# Patient Record
Sex: Male | Born: 1950 | Race: White | Hispanic: No | Marital: Married | Smoking: Former smoker
Health system: Southern US, Community
[De-identification: ages and names within clinical notes are randomized; demographics above are authoritative.]

## PROBLEM LIST (undated history)

## (undated) DIAGNOSIS — C4491 Basal cell carcinoma of skin, unspecified: Secondary | ICD-10-CM

## (undated) DIAGNOSIS — I10 Essential (primary) hypertension: Secondary | ICD-10-CM

## (undated) DIAGNOSIS — I219 Acute myocardial infarction, unspecified: Secondary | ICD-10-CM

## (undated) DIAGNOSIS — C61 Malignant neoplasm of prostate: Secondary | ICD-10-CM

## (undated) DIAGNOSIS — N189 Chronic kidney disease, unspecified: Secondary | ICD-10-CM

## (undated) DIAGNOSIS — R519 Headache, unspecified: Secondary | ICD-10-CM

## (undated) DIAGNOSIS — C801 Malignant (primary) neoplasm, unspecified: Secondary | ICD-10-CM

## (undated) DIAGNOSIS — I209 Angina pectoris, unspecified: Secondary | ICD-10-CM

## (undated) DIAGNOSIS — M722 Plantar fascial fibromatosis: Secondary | ICD-10-CM

## (undated) DIAGNOSIS — E119 Type 2 diabetes mellitus without complications: Secondary | ICD-10-CM

## (undated) DIAGNOSIS — I2510 Atherosclerotic heart disease of native coronary artery without angina pectoris: Secondary | ICD-10-CM

## (undated) DIAGNOSIS — C679 Malignant neoplasm of bladder, unspecified: Secondary | ICD-10-CM

## (undated) DIAGNOSIS — B019 Varicella without complication: Secondary | ICD-10-CM

## (undated) DIAGNOSIS — R51 Headache: Secondary | ICD-10-CM

## (undated) HISTORY — PX: COLONOSCOPY: SHX174

## (undated) HISTORY — DX: Malignant neoplasm of prostate: C61

## (undated) HISTORY — PX: MOHS SURGERY: SUR867

## (undated) HISTORY — PX: LITHOTRIPSY: SUR834

## (undated) HISTORY — PX: CORONARY ANGIOPLASTY: SHX604

## (undated) HISTORY — PX: HERNIA REPAIR: SHX51

## (undated) HISTORY — PX: PROSTATE SURGERY: SHX751

## (undated) HISTORY — PX: CYSTECTOMY: SUR359

---

## 2004-12-03 ENCOUNTER — Ambulatory Visit: Payer: Self-pay | Admitting: Family Medicine

## 2005-02-19 ENCOUNTER — Ambulatory Visit: Payer: Self-pay | Admitting: Unknown Physician Specialty

## 2006-08-04 ENCOUNTER — Ambulatory Visit: Payer: Self-pay

## 2006-08-13 ENCOUNTER — Ambulatory Visit: Payer: Self-pay | Admitting: Urology

## 2006-08-13 ENCOUNTER — Other Ambulatory Visit: Payer: Self-pay

## 2006-08-18 ENCOUNTER — Ambulatory Visit: Payer: Self-pay | Admitting: Urology

## 2006-08-29 ENCOUNTER — Ambulatory Visit: Payer: Self-pay | Admitting: Urology

## 2006-09-04 ENCOUNTER — Ambulatory Visit: Payer: Self-pay | Admitting: Urology

## 2007-09-21 ENCOUNTER — Emergency Department: Payer: Self-pay | Admitting: Emergency Medicine

## 2008-10-31 ENCOUNTER — Ambulatory Visit: Payer: Self-pay | Admitting: Unknown Physician Specialty

## 2009-06-20 DIAGNOSIS — C4491 Basal cell carcinoma of skin, unspecified: Secondary | ICD-10-CM

## 2009-06-20 HISTORY — DX: Basal cell carcinoma of skin, unspecified: C44.91

## 2011-01-05 ENCOUNTER — Emergency Department: Payer: Self-pay | Admitting: Internal Medicine

## 2011-01-16 ENCOUNTER — Ambulatory Visit: Payer: Self-pay | Admitting: Urology

## 2011-01-17 ENCOUNTER — Ambulatory Visit: Payer: Self-pay | Admitting: Urology

## 2011-05-14 ENCOUNTER — Ambulatory Visit: Payer: Self-pay | Admitting: Urology

## 2011-05-23 ENCOUNTER — Ambulatory Visit: Payer: Self-pay | Admitting: Urology

## 2012-10-07 ENCOUNTER — Inpatient Hospital Stay: Payer: Self-pay | Admitting: Specialist

## 2012-10-07 LAB — CK TOTAL AND CKMB (NOT AT ARMC)
CK, Total: 126 U/L (ref 35–232)
CK-MB: 1.6 ng/mL (ref 0.5–3.6)

## 2012-10-07 LAB — TROPONIN I
Troponin-I: >40 ng/mL
Troponin-I: >40 ng/mL

## 2012-10-07 LAB — CBC
HCT: 42.9 % (ref 40.0–52.0)
MCH: 31.8 pg (ref 26.0–34.0)
MCHC: 36.1 g/dL — ABNORMAL HIGH (ref 32.0–36.0)
Platelet: 265 10*3/uL (ref 150–440)
RDW: 13.8 % (ref 11.5–14.5)

## 2012-10-07 LAB — BASIC METABOLIC PANEL
Creatinine: 1.1 mg/dL (ref 0.60–1.30)
EGFR (African American): >60
Glucose: 205 mg/dL — ABNORMAL HIGH (ref 65–99)
Sodium: 138 mmol/L (ref 136–145)

## 2012-10-07 LAB — APTT: Activated PTT: 24.8 secs (ref 23.6–35.9)

## 2012-10-07 LAB — CK-MB: CK-MB: 541.4 ng/mL — ABNORMAL HIGH (ref 0.5–3.6)

## 2012-10-08 LAB — CK TOTAL AND CKMB (NOT AT ARMC)
CK, Total: 1615 U/L — ABNORMAL HIGH (ref 35–232)
CK-MB: 273.3 ng/mL — ABNORMAL HIGH (ref 0.5–3.6)

## 2012-10-08 LAB — BASIC METABOLIC PANEL
BUN: 20 mg/dL — ABNORMAL HIGH (ref 7–18)
Calcium, Total: 8 mg/dL — ABNORMAL LOW (ref 8.5–10.1)
Chloride: 103 mmol/L (ref 98–107)
Creatinine: 0.95 mg/dL (ref 0.60–1.30)
EGFR (Non-African Amer.): >60
Glucose: 188 mg/dL — ABNORMAL HIGH (ref 65–99)
Sodium: 137 mmol/L (ref 136–145)

## 2012-10-08 LAB — TROPONIN I: Troponin-I: >40 ng/mL

## 2013-09-21 ENCOUNTER — Ambulatory Visit: Payer: Self-pay | Admitting: Urology

## 2013-09-23 ENCOUNTER — Ambulatory Visit: Payer: Self-pay | Admitting: Urology

## 2013-09-27 ENCOUNTER — Ambulatory Visit: Payer: Self-pay | Admitting: Urology

## 2013-09-29 LAB — PATHOLOGY REPORT

## 2013-10-18 DIAGNOSIS — C679 Malignant neoplasm of bladder, unspecified: Secondary | ICD-10-CM | POA: Insufficient documentation

## 2014-07-01 NOTE — H&P (Signed)
PATIENT NAME:  Larry Mann, Larry Mann MR#:  [id number] DATE OF BIRTH:  Feb 03, 1951  DATE OF ADMISSION:  10/07/2012  PRIMARY CARE PHYSICIAN: Irven Easterly. Kary Kos, MD  CHIEF COMPLAINT: Chest pain.   HISTORY OF PRESENT ILLNESS: This is a 64 year old male who presents to the hospital with chest pain that began around 10:00 last night. He thought it was musculoskeletal in nature as he has some right rotator cuff issues, although his chest pain and pressure did not improve. He therefore came to the ER for further evaluation. Since patient's pain has been progressively getting worse and he continues to have pain here in the ER, hospitalist services were contacted for further treatment and evaluation. The patient describes his pain as being sharp in nature, located in the center of his chest, sometimes radiating to the right side. He has become diaphoretic with it, became a bit nauseated, but no vomiting. He does admit to some mild shortness of breath. He says his pain is similar to when he had his anginal symptoms in 2003 when he had a cardiac stent. Given his significant risk factors, hospitalist services were contacted for treatment and evaluation.   REVIEW OF SYSTEMS:  CONSTITUTIONAL: No documented fever. No weight gain, no weight loss.  EYES: No blurred or double vision.  ENT: No tinnitus. No postnasal drip. No redness of the oropharynx.  RESPIRATORY: No cough, no wheeze, no hemoptysis. Positive dyspnea.  CARDIOVASCULAR: Positive chest pain. No orthopnea. No palpitations. No syncope.  GASTROINTESTINAL: Positive nausea. No vomiting, no diarrhea. No abdominal pain. No melena or hematochezia.  GENITOURINARY: No dysuria, no hematuria.  ENDOCRINE: No polyuria or nocturia. No heat or cold intolerance.  HEMATOLOGIC: No anemia, no bruising, no bleeding.  INTEGUMENTARY: No rashes. No lesions.  MUSCULOSKELETAL: No arthritis. No swelling. No gout.  NEUROLOGIC: No numbness or tingling. No ataxia. No seizure-type  activity.  PSYCHIATRIC: No anxiety. No insomnia. No ADD.   PAST MEDICAL HISTORY: Consistent with:  1. Hypertension.  2. Hyperlipidemia.  3. Diabetes.  4. History of coronary artery disease status post stent placement in 2003.   ALLERGIES: PENICILLIN, WHICH CAUSES HIVES AND ANAPHYLAXIS.   SOCIAL HISTORY: Used to be a smoker, quit in 2003. Does have a 20 pack-year smoking history. Occasional alcohol abuse. No illicit drug abuse. Lives at home with his wife.   FAMILY HISTORY: Mother is deceased from complications of multiple myeloma. Father is alive, has coronary disease and COPD. He does have a strong family history of coronary disease.   CURRENT MEDICATIONS: As follows:  1. Aspirin 81 mg daily.  2. Lipitor 20 mg daily.  3. Amlodipine 10 mg daily.  4. Lisinopril 20 mg daily.  5. Metformin 1000 mg b.i.d.  6. Metoprolol tartrate 25 mg b.i.d.   PHYSICAL EXAMINATION: On admission, is as follows: VITAL SIGNS: Noted to be: Temperature is 97.6, pulse 96, respirations 22, blood pressure 141/85, sat is 96% on 2 liters nasal cannula.  GENERAL: He is an anxious-appearing male, but in no apparent distress.  HEAD, EYES, EARS, NOSE AND THROAT: The patient is atraumatic, normocephalic. Extraocular muscles are intact. Pupils equal and reactive to light. Sclerae anicteric. No conjunctival injection. No pharyngeal erythema.  NECK: Supple. There is no jugular venous distention, no bruits, no lymphadenopathy, no thyromegaly.  HEART: Regular rate and rhythm. No murmurs. No rubs. No clicks.  LUNGS: Clear to auscultation bilaterally. No rales or rhonchi. No wheezes.  ABDOMEN: Soft, flat, nontender, nondistended. Has good bowel sounds. No hepatosplenomegaly appreciated.  EXTREMITIES: No evidence  of any cyanosis, clubbing or peripheral edema. Has +2 pedal and radial pulses bilaterally.  NEUROLOGICAL: The patient is alert, awake and oriented x3 with no focal motor or sensory deficits appreciated bilaterally.   SKIN: Moist and warm with no rashes appreciated.  LYMPHATIC: There is no cervical or axillary lymphadenopathy.   LABORATORY: Showed a serum glucose of 205, BUN 21, creatinine 1.10, sodium 138, potassium 2.9, chloride 102, bicarbonate 25, troponin 0.04, white cell count 4.9, hemoglobin 15.5, hematocrit 42.9, platelet count 265.   The patient did have an EKG done which shows normal sinus rhythm with normal axis, but sinus tachycardia and evidence of LVH by voltage criteria.   ASSESSMENT AND PLAN: This is a 64 year old male with a history of hypertension, hyperlipidemia, history of coronary artery disease status post stent placement and diabetes, who presents to the hospital with chest pain/pressure.   1. Chest pain/pressure. This patient's symptoms are very worrisome for unstable angina as his symptoms have been progressive and getting worse since last night. He does have significant risk factors given his history of coronary artery disease, diabetes, history of tobacco abuse and also family history. I will start him on aspirin, statin, beta blocker, sublingual nitroglycerin, give him some morphine for pain, also continue some oxygen and start him on a heparin nomogram. I discussed the case with Dr. Clayborn Bigness from cardiology, who plans on doing cardiac catheterization on the patient today. Will keep him n.p.o. for now except for medications.  2. Hypertension. The patient dropped his blood pressure shortly after he got some nitroglycerin. For now, will continue his beta blocker, lisinopril and Norvasc and follow hemodynamics.  3. Hyperlipidemia. Continue Lipitor.  4. Diabetes. Hold his metformin as he may get cardiac catheterization. Continue sliding scale insulin.  5. Hypokalemia. I will replace his potassium accordingly and repeat it in the morning.   CODE STATUS: The patient is a full code.   TIME SPENT ON ADMISSION: 50 minutes.   ____________________________ Belia Heman. Verdell Carmine,  MD vjs:OSi D: 10/07/2012 08:23:53 ET T: 10/07/2012 08:40:08 ET JOB#: [id number]  cc: Belia Heman. Verdell Carmine, MD, <Dictator> Henreitta Leber MD ELECTRONICALLY SIGNED 10/10/2012 15:12

## 2014-07-01 NOTE — Discharge Summary (Signed)
PATIENT NAME:  Larry Mann, Larry Mann MR#:  [id number] DATE OF BIRTH:  Jul 24, 1950  DATE OF ADMISSION:  10/07/2012 DATE OF DISCHARGE:  10/08/2012   For a detailed note, please take a look at the history and physical on admission.   DIAGNOSES AT DISCHARGE: As follows:  1. Non-ST-elevation myocardial infarction status post cardiac catheterization and stent to the obtuse marginal 1.  2. Coronary artery disease.  3. Hypertension.  4. Hyperlipidemia.  5. Hypokalemia.   DIET: The patient is being discharged on a low sodium, low fat diet.   ACTIVITIES: As tolerated.   FOLLOWUP: With Dr. Nehemiah Massed in the next 1 to 2 weeks. Also follow up with Dr. Jarome Lamas in the next 1 to 2 weeks.  DISCHARGE MEDICATIONS: As follows: 1. Metoprolol tartrate 25 mg b.i.d. 2. Lisinopril 20 mg daily.  3. Amlodipine 10 mg daily.  4. Aspirin 81 mg daily.  5. Metformin 1000 mg b.i.d. 6. Plavix 75 mg daily.  7. Lipitor 80 mg at bedtime.  8. Potassium 20 mEq b.i.d. for 4 days.   [place] COURSE: Dr. Lujean Amel from cardiology.   PERTINENT STUDIES DONE DURING THE HOSPITAL COURSE: As follows: A cardiac catheterization done by Dr. Clayborn Bigness which showed ejection fraction of 50%, but the patient was noted to have a 100% stenosis of the obtuse marginal, and the patient underwent PCI and drug-eluting stent to the obtuse marginal vessel. The patient had a mid LAD lesion 50% to 75%, but it was the nonculprit lesion.   HOSPITAL COURSE: This is a 64 year old male with medical problems as mentioned above, who presented to the hospital on 10/07/2012 complaining of chest pain.   1. Non-ST elevation myocardial infarction. The patient presented to the hospital with chest pain progressively getting worse, worrisome for unstable angina. The patient has a previous history of coronary artery disease and stent placement in 2003. A cardiology consult was obtained to further evaluate the patient as he had  significant risk factors given previous history of CAD with previous history of tobacco abuse and diabetes. The patient, after evaluation by Dr. Clayborn Bigness, underwent an urgent cardiac catheterization which did show a 100% stenosis of the obtuse marginal 1, and therefore the patient underwent drug-eluting stent placement to that vessel. Post catheterization and stent placement, the patient has been chest pain free and hemodynamically stable. His EF is 55% based on the cardiac catheterization. The patient is therefore presently being discharged on aspirin, Plavix, metoprolol, ACE inhibitor and a statin and close followup with cardiology as an outpatient. Initial troponins on the patient were negative, but they did peak to as high as greater than 40; therefore, this was truly a non-ST elevation MI.  2. Hypertension. The patient remained hemodynamically stable. He will continue his metoprolol, lisinopril and Norvasc as stated.  3. Hyperlipidemia. The patient was already on Lipitor prior to coming in at 20 mg, but I did raise the dose to 80 mg given his acute non-ST elevation MI.  4. Diabetes. The patient's metformin was held as he got dye from cardiac catheterization. He is advised to hold his metformin for 1 more day and then resume it tomorrow. He was maintained on sliding scale insulin while in the hospital.  5. Hypokalemia. The patient is being discharged on some oral potassium supplements.   CODE STATUS: The patient is a full code.   TIME SPENT ON DISCHARGE: 40 minutes.   ____________________________ Belia Heman. Verdell Carmine, MD vjs:OSi D: 10/08/2012 11:59:34 ET T: 10/08/2012 12:19:42 ET  JOB#: [id number]  cc: Belia Heman. Verdell Carmine, MD, <Dictator> Corey Skains, MD Irven Easterly. Kary Kos, MD Henreitta Leber MD ELECTRONICALLY SIGNED 10/10/2012 15:12

## 2014-07-02 NOTE — H&P (Signed)
PATIENT NAME:  Larry Mann, Larry Mann MR#:  [id number] DATE OF BIRTH:  23-Jun-1950  DATE OF ADMISSION:  09/27/2013   CHIEF COMPLAINT: Recurrent bladder cancer.   HISTORY OF PRESENT ILLNESS: Larry Mann is a 64 year old white male who underwent surveillance cystoscopy June 17th and was found to have papillary bladder tumor just proximal to the right ureteral orifice and papillary tumor of the prostatic urethra. The bladder tumor measured 15 x 10 mm and the prostatic urethral tumor measured 10 x 10 mm. He comes in now for transurethral resection of the bladder tumor and transurethral resection of the prostate and prostatic urethral tumor.   ALLERGIES:  PENICILLIN.  CURRENT MEDICATIONS: Include aspirin, metformin, metoprolol, glipizide, lisinopril, atorvastatin, amlodipine  PAST SURGICAL HISTORY: Included:  1.  Right inguinal herniorrhaphy in 2003.  2.  Coronary stent placement in 2003.  3.  Coronary stent placement in 2014.  4.  Transurethral resection of bladder cancer in 2008.  5.  ESWL, 09/23/2013.   SOCIAL HISTORY: The patient denied tobacco use. He consumes alcohol socially. He quit smoking in 2003 with a 20 pack-year history.   FAMILY HISTORY: The patient's father is alive and well at age 74. Mother died of cancer at age 47.   PAST AND CURRENT MEDICAL CONDITIONS:  1.  Diabetes.  2.  Coronary artery disease status post stent placement.  3.  History of superficial skin cancer.  4. Hypercholesterolemia.   REVIEW OF SYSTEMS:  The patient denied chest pain, shortness of breath or stroke.   PHYSICAL EXAMINATION: GENERAL: A well-nourished white male in no acute distress.  HEENT: Sclerae were clear. Pupils were equally round, reactive to light and accommodation. Extraocular movements intact.  NECK: Supple. No palpable cervical adenopathy. No audible carotid bruits.  LUNGS: Clear to auscultation.  CARDIOVASCULAR: Regular rhythm and rate without audible murmurs.  ABDOMEN: Soft, nontender  abdomen.  GENITOURINARY: Circumcised. Testes were atrophic, 15 cubic centimeters in size each.  RECTAL: 40 gram, smooth, nontender prostate.  NEUROMUSCULAR: Alert and oriented x 3.   IMPRESSION: 1.  Transitional cell carcinoma of the bladder.  2.  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   PLAN: 1.  Transurethral resection of bladder tumor.  2.  Transurethral resection of the prostatic urethral tumor and prostate.     ____________________________ Otelia Limes. Yves Dill, MD [email or web address]:[id number] D: 09/20/2013 10:12:02 ET T: 09/20/2013 10:39:18 ET JOB#: [id number]  cc: Otelia Limes. Yves Dill, MD, <Dictator> Royston Cowper MD ELECTRONICALLY SIGNED 09/21/2013 8:30

## 2014-07-02 NOTE — Op Note (Signed)
PATIENT NAME:  Larry Mann, Larry Mann MR#:  [id number] DATE OF BIRTH:  1951-02-25  DATE OF PROCEDURE:  09/27/2013  PREOPERATIVE DIAGNOSES:  1.  Transitional cell carcinoma of the bladder.  2.  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   POSTOPERATIVE DIAGNOSES:  1.  Transitional cell carcinoma of the bladder.  2.  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   PROCEDURES:  1.  Transurethral resection of the bladder tumor.  2.  Partial transurethral resection of the prostate.   SURGEON: Otelia Limes. Yves Dill, MD  ANESTHETISTBenjamine Mola  ANESTHETIC METHOD: General per [place] and local per Dr. Yves Dill.   INDICATIONS: See the dictated history and physical. After informed consent, the patient requested the above procedures.   OPERATIVE SUMMARY: After adequate general anesthesia had been obtained, the patient was placed into dorsal lithotomy position and the perineum was prepped and draped in the usual fashion. Urethra was dilated up to 28-French with Leander Rams sounds. A 27-French resectoscope sheath was then advanced into the bladder with the obturator in place. Resectoscope was then coupled to the camera and then placed into the sheath. The bladder was thoroughly inspected. Both ureteral orifices were identified and had clear efflux. The patient had a 10 x 20 mm papillary bladder tumor extending just lateral to the right orifice up the right bladder wall. The patient also had a 10 x 10 mm papillary tumor of the right bladder neck extending into the prostatic urethra. The bladder was moderately trabeculated. No other bladder lesions were identified. The patient had lateral lobe prostatic hypertrophy. At this point, the bladder tumor on the right lateral wall was resected and fragments submitted to pathology. The base of the tumor was cauterized. The prostatic urethral tumor was then resected and specimen submitted separately. The base of this area was  also cauterized. Finally, the surgical sites were inspected and further cauterization was performed with the rollerball electrode. At this point, the scope was removed and 10 mL of viscous Xylocaine was instilled within the urethra and the bladder. A [id number] silicone catheter was placed. The catheter was irrigated until clear. B and O suppository was placed. The procedure was then terminated and the patient was transferred to the recovery room in stable condition.   ____________________________ Otelia Limes. Yves Dill, MD mrw:sk D: 09/27/2013 15:28:46 ET T: 09/28/2013 00:35:19 ET JOB#: [id number]  cc: Otelia Limes. Yves Dill, MD, <Dictator> Royston Cowper MD ELECTRONICALLY SIGNED 09/28/2013 9:18

## 2014-07-29 ENCOUNTER — Other Ambulatory Visit: Payer: Self-pay

## 2014-10-26 ENCOUNTER — Encounter: Payer: Self-pay | Admitting: *Deleted

## 2014-10-27 ENCOUNTER — Ambulatory Visit: Payer: Commercial Managed Care - HMO | Admitting: Anesthesiology

## 2014-10-27 ENCOUNTER — Encounter
Admission: RE | Disposition: A | Discharge status: Home / Self Care | Payer: Self-pay | Source: Ambulatory Visit | Attending: Unknown Physician Specialty

## 2014-10-27 ENCOUNTER — Encounter: Payer: Self-pay | Admitting: *Deleted

## 2014-10-27 ENCOUNTER — Ambulatory Visit
Admission: RE | Admit: 2014-10-27 | Discharge: 2014-10-27 | Disposition: A | Discharge status: Home / Self Care | Payer: Commercial Managed Care - HMO | Source: Ambulatory Visit | Attending: Unknown Physician Specialty | Admitting: Unknown Physician Specialty

## 2014-10-27 DIAGNOSIS — I129 Hypertensive chronic kidney disease with stage 1 through stage 4 chronic kidney disease, or unspecified chronic kidney disease: Secondary | ICD-10-CM | POA: Insufficient documentation

## 2014-10-27 DIAGNOSIS — Z9889 Other specified postprocedural states: Secondary | ICD-10-CM | POA: Insufficient documentation

## 2014-10-27 DIAGNOSIS — D123 Benign neoplasm of transverse colon: Secondary | ICD-10-CM | POA: Insufficient documentation

## 2014-10-27 DIAGNOSIS — I2510 Atherosclerotic heart disease of native coronary artery without angina pectoris: Secondary | ICD-10-CM | POA: Insufficient documentation

## 2014-10-27 DIAGNOSIS — N189 Chronic kidney disease, unspecified: Secondary | ICD-10-CM | POA: Insufficient documentation

## 2014-10-27 DIAGNOSIS — K5730 Diverticulosis of large intestine without perforation or abscess without bleeding: Secondary | ICD-10-CM | POA: Diagnosis not present

## 2014-10-27 DIAGNOSIS — I252 Old myocardial infarction: Secondary | ICD-10-CM | POA: Diagnosis not present

## 2014-10-27 DIAGNOSIS — Z86010 Personal history of colonic polyps: Secondary | ICD-10-CM | POA: Diagnosis present

## 2014-10-27 DIAGNOSIS — Z87891 Personal history of nicotine dependence: Secondary | ICD-10-CM | POA: Insufficient documentation

## 2014-10-27 DIAGNOSIS — Z79899 Other long term (current) drug therapy: Secondary | ICD-10-CM | POA: Diagnosis not present

## 2014-10-27 DIAGNOSIS — K640 First degree hemorrhoids: Secondary | ICD-10-CM | POA: Insufficient documentation

## 2014-10-27 DIAGNOSIS — Z859 Personal history of malignant neoplasm, unspecified: Secondary | ICD-10-CM | POA: Insufficient documentation

## 2014-10-27 DIAGNOSIS — E1122 Type 2 diabetes mellitus with diabetic chronic kidney disease: Secondary | ICD-10-CM | POA: Diagnosis not present

## 2014-10-27 DIAGNOSIS — Z9861 Coronary angioplasty status: Secondary | ICD-10-CM | POA: Insufficient documentation

## 2014-10-27 DIAGNOSIS — Z85828 Personal history of other malignant neoplasm of skin: Secondary | ICD-10-CM | POA: Insufficient documentation

## 2014-10-27 HISTORY — DX: Plantar fascial fibromatosis: M72.2

## 2014-10-27 HISTORY — DX: Headache, unspecified: R51.9

## 2014-10-27 HISTORY — DX: Chronic kidney disease, unspecified: N18.9

## 2014-10-27 HISTORY — DX: Acute myocardial infarction, unspecified: I21.9

## 2014-10-27 HISTORY — DX: Malignant (primary) neoplasm, unspecified: C80.1

## 2014-10-27 HISTORY — DX: Essential (primary) hypertension: I10

## 2014-10-27 HISTORY — DX: Basal cell carcinoma of skin, unspecified: C44.91

## 2014-10-27 HISTORY — DX: Headache: R51

## 2014-10-27 HISTORY — DX: Varicella without complication: B01.9

## 2014-10-27 HISTORY — DX: Angina pectoris, unspecified: I20.9

## 2014-10-27 HISTORY — DX: Type 2 diabetes mellitus without complications: E11.9

## 2014-10-27 HISTORY — PX: COLONOSCOPY WITH PROPOFOL: SHX5780

## 2014-10-27 HISTORY — DX: Atherosclerotic heart disease of native coronary artery without angina pectoris: I25.10

## 2014-10-27 LAB — GLUCOSE, CAPILLARY: Glucose-Capillary: 181 mg/dL — ABNORMAL HIGH (ref 65–99)

## 2014-10-27 SURGERY — COLONOSCOPY WITH PROPOFOL
Anesthesia: General

## 2014-10-27 MED ORDER — LABETALOL HCL 5 MG/ML IV SOLN
INTRAVENOUS | Status: DC | PRN
  Administered 2014-10-27: 5 mg via INTRAVENOUS

## 2014-10-27 MED ORDER — MIDAZOLAM HCL 5 MG/5ML IJ SOLN
INTRAMUSCULAR | Status: DC | PRN
  Administered 2014-10-27: 1 mg via INTRAVENOUS

## 2014-10-27 MED ORDER — PROPOFOL INFUSION 10 MG/ML OPTIME
INTRAVENOUS | Status: DC | PRN
  Administered 2014-10-27: 120 ug/kg/min via INTRAVENOUS

## 2014-10-27 MED ORDER — PROPOFOL 10 MG/ML IV BOLUS
INTRAVENOUS | Status: DC | PRN
  Administered 2014-10-27: 50 mg via INTRAVENOUS

## 2014-10-27 MED ORDER — SODIUM CHLORIDE 0.9 % IV SOLN
INTRAVENOUS | Status: DC
  Administered 2014-10-27: 08:00:00 via INTRAVENOUS

## 2014-10-27 MED ORDER — SODIUM CHLORIDE 0.9 % IV SOLN: INTRAVENOUS | Status: DC

## 2014-10-27 MED ORDER — LIDOCAINE HCL (PF) 2 % IJ SOLN
INTRAMUSCULAR | Status: DC | PRN
  Administered 2014-10-27: 50 mg

## 2014-10-27 MED ORDER — FENTANYL CITRATE (PF) 100 MCG/2ML IJ SOLN
INTRAMUSCULAR | Status: DC | PRN
  Administered 2014-10-27: 50 ug via INTRAVENOUS

## 2014-10-27 NOTE — H&P (Signed)
   Primary Care Physician:  [place] Pink, MD Primary Gastroenterologist:  Dr. Vira Agar  Pre-Procedure History & Physical: HPI:  Larry Mann is a 64 y.o. male is here for an colonoscopy.   Past Medical History  Diagnosis Date  . Hypertension   . Diabetes mellitus without complication   . Coronary artery disease   . Myocardial infarction   . Headache   . Chronic kidney disease   . Plantar fasciitis   . Cancer   . Chicken pox   . Basal cell carcinoma   . Anginal pain     Past Surgical History  Procedure Laterality Date  . Hernia repair    . Coronary angioplasty    . Lithotripsy    . Colonoscopy    . Prostate surgery    . Cystectomy      Prior to Admission medications   Medication Sig Start Date End Date Taking? Authorizing Provider  amLODipine (NORVASC) 10 MG tablet Take 10 mg by mouth daily.   Yes Historical Provider, MD  aspirin EC 81 MG tablet Take 81 mg by mouth daily.   Yes Historical Provider, MD  atorvastatin (LIPITOR) 20 MG tablet Take 20 mg by mouth daily.   Yes Historical Provider, MD  glipiZIDE (GLUCOTROL XL) 5 MG 24 hr tablet Take 5 mg by mouth daily with breakfast.   Yes Historical Provider, MD  lisinopril (PRINIVIL,ZESTRIL) 20 MG tablet Take 20 mg by mouth daily.   Yes Historical Provider, MD  metFORMIN (GLUCOPHAGE) 1000 MG tablet Take 1,000 mg by mouth 2 (two) times daily with a meal.   Yes Historical Provider, MD  metoprolol tartrate (LOPRESSOR) 25 MG tablet Take 25 mg by mouth 2 (two) times daily.   Yes Historical Provider, MD    Allergies as of 09/21/2014  . (Not on File)    History reviewed. No pertinent family history.  Social History   Social History  . Marital Status: Married    Spouse Name: N/A  . Number of Children: N/A  . Years of Education: N/A   Occupational History  . Not on file.   Social History Main Topics  . Smoking status: Former Research scientist (life sciences)  . Smokeless tobacco: Not on file  . Alcohol Use: No  . Drug Use: No  . Sexual  Activity: Not on file   Other Topics Concern  . Not on file   Social History Narrative    Review of Systems: See HPI, otherwise negative ROS  Physical Exam: BP 144/90 mmHg  Pulse 100  Temp(Src) 97.3 F (36.3 C) (Tympanic)  Resp 18  Ht 5\' 6"  (1.676 m)  Wt 83.915 kg (185 lb)  BMI 29.87 kg/m2  SpO2 98% General:   Alert,  pleasant and cooperative in NAD Head:  Normocephalic and atraumatic. Neck:  Supple; no masses or thyromegaly. Lungs:  Clear throughout to auscultation.    Heart:  Regular rate and rhythm. Abdomen:  Soft, nontender and nondistended. Normal bowel sounds, without guarding, and without rebound.   Neurologic:  Alert and  oriented x4;  grossly normal neurologically.  Impression/Plan: Larry Mann is here for an colonoscopy to be performed for [facility] of colon polyps  Risks, benefits, limitations, and alternatives regarding  colonoscopy have been reviewed with the patient.  Questions have been answered.  All parties agreeable.   Gaylyn Cheers, MD  10/27/2014, 8:03 AM

## 2014-10-27 NOTE — Transfer of Care (Signed)
Immediate Anesthesia Transfer of Care Note  Patient: Larry Mann  Procedure(s) Performed: Procedure(s): COLONOSCOPY WITH PROPOFOL (N/A)  Patient Location: PACU  Anesthesia Type:General  Level of Consciousness: sedated  Airway & Oxygen Therapy: Patient Spontanous Breathing and Patient connected to nasal cannula oxygen  Post-op Assessment: Report given to RN and Post -op Vital signs reviewed and stable  Post vital signs: Reviewed and stable  Last Vitals:  Filed Vitals:   10/27/14 0721  BP: 144/90  Pulse: 100  Temp: 36.3 C  Resp: 18    Complications: No apparent anesthesia complications

## 2014-10-27 NOTE — Anesthesia Preprocedure Evaluation (Signed)
Anesthesia Evaluation  Patient identified by MRN, date of birth, ID band Patient awake    Reviewed: Allergy & Precautions, H&P , NPO status , Patient's Chart, lab work & pertinent test results, reviewed documented beta blocker date and time   History of Anesthesia Complications Negative for: history of anesthetic complications  Airway Mallampati: I  TM Distance: >3 FB Neck ROM: full    Dental no notable dental hx.    Pulmonary neg shortness of breath, neg sleep apnea, neg COPDneg recent URI, former smoker,  breath sounds clear to auscultation  Pulmonary exam normal       Cardiovascular Exercise Tolerance: Good hypertension, On Medications - angina+ CAD, + Past MI and + Cardiac Stents - CABG negative cardio ROS Normal cardiovascular exam- dysrhythmias - Valvular Problems/MurmursRhythm:regular Rate:Normal     Neuro/Psych negative neurological ROS  negative psych ROS   GI/Hepatic negative GI ROS, Neg liver ROS,   Endo/Other  diabetes, Oral Hypoglycemic Agents  Renal/GU CRFRenal disease  negative genitourinary   Musculoskeletal   Abdominal   Peds  Hematology negative hematology ROS (+)   Anesthesia Other Findings Past Medical History:   Hypertension                                                 Diabetes mellitus without complication                       Coronary artery disease                                      Myocardial infarction                                        Headache                                                     Chronic kidney disease                                       Plantar fasciitis                                            Cancer                                                       Chicken pox                                                  Basal cell  carcinoma                                         Anginal pain                                                 Reproductive/Obstetrics negative OB ROS                             Anesthesia Physical Anesthesia Plan  ASA: III  Anesthesia Plan: General   Post-op Pain Management:    Induction:   Airway Management Planned:   Additional Equipment:   Intra-op Plan:   Post-operative Plan:   Informed Consent: I have reviewed the patients History and Physical, chart, labs and discussed the procedure including the risks, benefits and alternatives for the proposed anesthesia with the patient or authorized representative who has indicated his/her understanding and acceptance.   Dental Advisory Given  Plan Discussed with: Anesthesiologist, CRNA and Surgeon  Anesthesia Plan Comments:         Anesthesia Quick Evaluation

## 2014-10-27 NOTE — Op Note (Signed)
[facility] Gastroenterology Patient Name: Larry Mann Procedure Date: 10/27/2014 7:21 AM MRN: [id number] Account #: [id number] Date of Birth: [birth year] Admit Type: Outpatient Age: 64 Room: [facility] ENDO ROOM 1 Gender: Male Note Status: Finalized Procedure:         Colonoscopy Indications:       Personal history of colonic polyps Providers:         Manya Silvas, MD Referring MD:      Irven Easterly. Kary Kos, MD (Referring MD) Medicines:         Propofol per Anesthesia Complications:     No immediate complications. Procedure:         Pre-Anesthesia Assessment:                    - After reviewing the risks and benefits, the patient was                     deemed in satisfactory condition to undergo the procedure.                    After obtaining informed consent, the colonoscope was                     passed under direct vision. Throughout the procedure, the                     patient's blood pressure, pulse, and oxygen saturations                     were monitored continuously. The Colonoscope was                     introduced through the anus and advanced to the the cecum,                     identified by appendiceal orifice and ileocecal valve. The                     colonoscopy was somewhat difficult due to restricted                     mobility of the colon and a tortuous colon. Successful                     completion of the procedure was aided by applying                     abdominal pressure. The patient tolerated the procedure                     well. The quality of the bowel preparation was good. Findings:      A small polyp was found in the transverse colon. The polyp was sessile.       The polyp was removed with a jumbo cold forceps. Resection and retrieval       were complete.      Multiple small and large-mouthed diverticula were found in the sigmoid       colon, in the descending colon, in the transverse colon and in the       ascending  colon.      Internal hemorrhoids were found during endoscopy. The hemorrhoids were       small and Grade I (internal hemorrhoids that do not prolapse).  The exam was otherwise without abnormality. Impression:        - One small polyp in the transverse colon. Resected and                     retrieved.                    - Diverticulosis in the sigmoid colon, in the descending                     colon, in the transverse colon and in the ascending colon.                    - Internal hemorrhoids.                    - The examination was otherwise normal. Recommendation:    - Await pathology results. Manya Silvas, MD 10/27/2014 8:36:37 AM This report has been signed electronically. Number of Addenda: 0 Note Initiated On: 10/27/2014 7:21 AM Scope Withdrawal Time: 0 hours 8 minutes 51 seconds  Total Procedure Duration: 0 hours 21 minutes 54 seconds       [facility]

## 2014-10-28 ENCOUNTER — Encounter: Payer: Self-pay | Admitting: Unknown Physician Specialty

## 2014-10-28 NOTE — Anesthesia Postprocedure Evaluation (Signed)
  Anesthesia Post-op Note  Patient: Larry Mann  Procedure(s) Performed: Procedure(s): COLONOSCOPY WITH PROPOFOL (N/A)  Anesthesia type:General  Patient location: PACU  Post pain: Pain level controlled  Post assessment: Post-op Vital signs reviewed, Patient's Cardiovascular Status Stable, Respiratory Function Stable, Patent Airway and No signs of Nausea or vomiting  Post vital signs: Reviewed and stable  Last Vitals:  Filed Vitals:   10/27/14 0910  BP: 121/86  Pulse: 85  Temp:   Resp: 12    Level of consciousness: awake, alert  and patient cooperative  Complications: No apparent anesthesia complications

## 2014-10-31 LAB — SURGICAL PATHOLOGY

## 2014-11-10 DIAGNOSIS — I10 Essential (primary) hypertension: Secondary | ICD-10-CM | POA: Insufficient documentation

## 2016-10-17 DIAGNOSIS — I2109 ST elevation (STEMI) myocardial infarction involving other coronary artery of anterior wall: Secondary | ICD-10-CM | POA: Insufficient documentation

## 2016-11-14 ENCOUNTER — Encounter: Payer: Commercial Managed Care - HMO | Attending: Internal Medicine | Admitting: *Deleted

## 2016-11-14 ENCOUNTER — Encounter: Payer: Self-pay | Admitting: *Deleted

## 2016-11-14 VITALS — Ht 65.8 in | Wt 186.1 lb

## 2016-11-14 DIAGNOSIS — I10 Essential (primary) hypertension: Secondary | ICD-10-CM | POA: Diagnosis not present

## 2016-11-14 DIAGNOSIS — Z955 Presence of coronary angioplasty implant and graft: Secondary | ICD-10-CM | POA: Diagnosis not present

## 2016-11-14 DIAGNOSIS — Z7984 Long term (current) use of oral hypoglycemic drugs: Secondary | ICD-10-CM | POA: Insufficient documentation

## 2016-11-14 DIAGNOSIS — E119 Type 2 diabetes mellitus without complications: Secondary | ICD-10-CM | POA: Insufficient documentation

## 2016-11-14 DIAGNOSIS — I214 Non-ST elevation (NSTEMI) myocardial infarction: Secondary | ICD-10-CM

## 2016-11-14 DIAGNOSIS — Z87891 Personal history of nicotine dependence: Secondary | ICD-10-CM | POA: Insufficient documentation

## 2016-11-14 DIAGNOSIS — Z79899 Other long term (current) drug therapy: Secondary | ICD-10-CM | POA: Diagnosis not present

## 2016-11-14 NOTE — Patient Instructions (Signed)
Patient Instructions  Patient Details  Name: Larry Mann MRN: [id number] Date of Birth: 12-May-1950 Referring Provider:  Corey Skains, MD  Below are the personal goals you chose as well as exercise and nutrition goals. Our goal is to help you keep on track towards obtaining and maintaining your goals. We will be discussing your progress on these goals with you throughout the program.  Initial Exercise Prescription:     Initial Exercise Prescription - 11/14/16 1500      Date of Initial Exercise RX and Referring Provider   Date 11/14/16   Referring Provider Serafina Royals MD     Treadmill   MPH 2.9   Grade 2   Minutes 15   METs 4.02     NuStep   Level 3   SPM 80   Minutes 15   METs 3     REL-XR   Level 2   Speed 50   Minutes 15   METs 3     Prescription Details   Frequency (times per week) 2   Duration Progress to 45 minutes of aerobic exercise without signs/symptoms of physical distress     Intensity   THRR 40-80% of Max Heartrate 113-142   Ratings of Perceived Exertion 11-13   Perceived Dyspnea 0-4     Progression   Progression Continue to progress workloads to maintain intensity without signs/symptoms of physical distress.     Resistance Training   Training Prescription Yes   Weight 3 lbs   Reps 10-15      Exercise Goals: Frequency: Be able to perform aerobic exercise three times per week working toward 3-5 days per week.  Intensity: Work with a perceived exertion of 11 (fairly light) - 15 (hard) as tolerated. Follow your new exercise prescription and watch for changes in prescription as you progress with the program. Changes will be reviewed with you when they are made.  Duration: You should be able to do 30 minutes of continuous aerobic exercise in addition to a 5 minute warm-up and a 5 minute cool-down routine.  Nutrition Goals: Your personal nutrition goals will be established when you do your nutrition analysis with the dietician.  The  following are nutrition guidelines to follow: Cholesterol < 200mg /day Sodium < 1500mg /day Fiber: Men over 50 yrs - 30 grams per day  Personal Goals:     Personal Goals and Risk Factors at Admission - 11/14/16 1321      Core Components/Risk Factors/Patient Goals on Admission    Weight Management Yes;Weight Loss   Intervention Weight Management: Develop a combined nutrition and exercise program designed to reach desired caloric intake, while maintaining appropriate intake of nutrient and fiber, sodium and fats, and appropriate energy expenditure required for the weight goal.;Weight Management: Provide education and appropriate resources to help participant work on and attain dietary goals.;Obesity: Provide education and appropriate resources to help participant work on and attain dietary goals.   Admit Weight 186 lb 1.6 oz (84.4 kg)   Goal Weight: Short Term 184 lb (83.5 kg)   Goal Weight: Long Term 180 lb (81.6 kg)   Expected Outcomes Short Term: Continue to assess and modify interventions until short term weight is achieved;Long Term: Adherence to nutrition and physical activity/exercise program aimed toward attainment of established weight goal   Diabetes Yes   Intervention Provide education about signs/symptoms and action to take for hypo/hyperglycemia.;Provide education about proper nutrition, including hydration, and aerobic/resistive exercise prescription along with prescribed medications to achieve blood glucose in  normal ranges: Fasting glucose 65-99 mg/dL   Expected Outcomes Short Term: Participant verbalizes understanding of the signs/symptoms and immediate care of hyper/hypoglycemia, proper foot care and importance of medication, aerobic/resistive exercise and nutrition plan for blood glucose control.;Long Term: Attainment of HbA1C < 7%.   Hypertension Yes   Intervention Provide education on lifestyle modifcations including regular physical activity/exercise, weight management,  moderate sodium restriction and increased consumption of fresh fruit, vegetables, and low fat dairy, alcohol moderation, and smoking cessation.;Monitor prescription use compliance.   Expected Outcomes Short Term: Continued assessment and intervention until BP is < 140/38mm HG in hypertensive participants. < 130/41mm HG in hypertensive participants with diabetes, heart failure or chronic kidney disease.;Long Term: Maintenance of blood pressure at goal levels.   Lipids Yes   Intervention Provide education and support for participant on nutrition & aerobic/resistive exercise along with prescribed medications to achieve LDL 70mg , HDL >40mg .   Expected Outcomes Short Term: Participant states understanding of desired cholesterol values and is compliant with medications prescribed. Participant is following exercise prescription and nutrition guidelines.;Long Term: Cholesterol controlled with medications as prescribed, with individualized exercise RX and with personalized nutrition plan. Value goals: LDL < 70mg , HDL > 40 mg.      Tobacco Use Initial Evaluation: History  Smoking Status  . Former Smoker  . Quit date: 05/19/2001  Smokeless Tobacco  . Never Used    Exercise Goals and Review:     Exercise Goals    Row Name 11/14/16 1551             Exercise Goals   Increase Physical Activity Yes       Intervention Provide advice, education, support and counseling about physical activity/exercise needs.;Develop an individualized exercise prescription for aerobic and resistive training based on initial evaluation findings, risk stratification, comorbidities and participant's personal goals.       Expected Outcomes Achievement of increased cardiorespiratory fitness and enhanced flexibility, muscular endurance and strength shown through measurements of functional capacity and personal statement of participant.       Increase Strength and Stamina Yes       Intervention Provide advice, education,  support and counseling about physical activity/exercise needs.;Develop an individualized exercise prescription for aerobic and resistive training based on initial evaluation findings, risk stratification, comorbidities and participant's personal goals.       Expected Outcomes Achievement of increased cardiorespiratory fitness and enhanced flexibility, muscular endurance and strength shown through measurements of functional capacity and personal statement of participant.       Able to understand and use rate of perceived exertion (RPE) scale Yes       Intervention Provide education and explanation on how to use RPE scale       Expected Outcomes Short Term: Able to use RPE daily in rehab to express subjective intensity level;Long Term:  Able to use RPE to guide intensity level when exercising independently       Able to understand and use Dyspnea scale Yes       Intervention Provide education and explanation on how to use Dyspnea scale       Expected Outcomes Short Term: Able to use Dyspnea scale daily in rehab to express subjective sense of shortness of breath during exertion;Long Term: Able to use Dyspnea scale to guide intensity level when exercising independently       Knowledge and understanding of Target Heart Rate Range (THRR) Yes       Intervention Provide education and explanation of THRR including how the  numbers were predicted and where they are located for reference       Expected Outcomes Short Term: Able to state/look up THRR;Long Term: Able to use THRR to govern intensity when exercising independently;Short Term: Able to use daily as guideline for intensity in rehab       Able to check pulse independently Yes       Intervention Provide education and demonstration on how to check pulse in carotid and radial arteries.;Review the importance of being able to check your own pulse for safety during independent exercise       Expected Outcomes Short Term: Able to explain why pulse checking is  important during independent exercise;Long Term: Able to check pulse independently and accurately       Understanding of Exercise Prescription Yes       Intervention Provide education, explanation, and written materials on patient's individual exercise prescription       Expected Outcomes Short Term: Able to explain program exercise prescription;Long Term: Able to explain home exercise prescription to exercise independently          Copy of goals given to participant.

## 2016-11-14 NOTE — Progress Notes (Signed)
Cardiac Individual Treatment Plan  Patient Details  Name: Larry Mann MRN: [id number] Date of Birth: 1950/07/08 Referring Provider:     Cardiac Rehab from 11/14/2016 in [facility] Cardiac and Pulmonary Rehab  Referring Provider  Serafina Royals MD      Initial Encounter Date:    Cardiac Rehab from 11/14/2016 in [facility] Cardiac and Pulmonary Rehab  Date  11/14/16  Referring Provider  Serafina Royals MD      Visit Diagnosis: NSTEMI (non-ST elevated myocardial infarction) [facility])  Status post coronary artery stent placement  Patient's Home Medications on Admission:  Current Outpatient Prescriptions:  .  amLODipine (NORVASC) 10 MG tablet, Take 10 mg by mouth daily., Disp: , Rfl:  .  aspirin EC 81 MG tablet, Take 81 mg by mouth daily., Disp: , Rfl:  .  atorvastatin (LIPITOR) 20 MG tablet, Take 20 mg by mouth daily., Disp: , Rfl:  .  glipiZIDE (GLUCOTROL XL) 5 MG 24 hr tablet, Take 5 mg by mouth daily with breakfast., Disp: , Rfl:  .  losartan (COZAAR) 100 MG tablet, Take by mouth., Disp: , Rfl:  .  metFORMIN (GLUCOPHAGE) 1000 MG tablet, Take 1,000 mg by mouth 2 (two) times daily with a meal., Disp: , Rfl:  .  metoprolol tartrate (LOPRESSOR) 25 MG tablet, Take 25 mg by mouth 2 (two) times daily., Disp: , Rfl:  .  potassium chloride (MICRO-K) 10 MEQ CR capsule, Take by mouth., Disp: , Rfl:  .  ticagrelor (BRILINTA) 90 MG TABS tablet, Take by mouth., Disp: , Rfl:   Past Medical History: Past Medical History:  Diagnosis Date  . Anginal pain ([place])   . Basal cell carcinoma   . Cancer ([place])   . Chicken pox   . Chronic kidney disease   . Coronary artery disease   . Diabetes mellitus without complication ([place])   . Headache   . Hypertension   . Myocardial infarction ([place])   . Plantar fasciitis     Tobacco Use: History  Smoking Status  . Former Smoker  . Quit date: 05/19/2001  Smokeless Tobacco  . Never Used    Labs: Recent Review Flowsheet Data    There is no flowsheet data to  display.       Exercise Target Goals: Date: 11/14/16  Exercise Program Goal: Individual exercise prescription set with THRR, safety & activity barriers. Participant demonstrates ability to understand and report RPE using BORG scale, to self-measure pulse accurately, and to acknowledge the importance of the exercise prescription.  Exercise Prescription Goal: Starting with aerobic activity 30 plus minutes a day, 3 days per week for initial exercise prescription. Provide home exercise prescription and guidelines that participant acknowledges understanding prior to discharge.  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 HR 84 bpm     Max Ex. BP 146/64     2 Minute Post BP 128/64        Oxygen Initial Assessment:   Oxygen Re-Evaluation:   Oxygen Discharge (Final Oxygen Re-Evaluation):   Initial Exercise Prescription:     Initial Exercise Prescription - 11/14/16 1500      Date of Initial Exercise RX and Referring Provider   Date 11/14/16   Referring Provider Serafina Royals MD     Treadmill   MPH 2.9   Grade 2   Minutes 15   METs 4.02     NuStep   Level 3   SPM 80   Minutes 15   METs 3     REL-XR   Level 2   Speed 50   Minutes 15   METs 3      Prescription Details   Frequency (times per week) 2   Duration Progress to 45 minutes of aerobic exercise without signs/symptoms of physical distress     Intensity   THRR 40-80% of Max Heartrate 113-142   Ratings of Perceived Exertion 11-13   Perceived Dyspnea 0-4     Progression   Progression Continue to progress workloads to maintain intensity without signs/symptoms of physical distress.     Resistance Training   Training Prescription Yes   Weight 3 lbs   Reps 10-15      Perform Capillary Blood Glucose checks as needed.  Exercise Prescription Changes:     Exercise Prescription Changes    Row Name 11/14/16 1500             Response to Exercise   Blood Pressure (Admit) 116/64       Blood Pressure (Exercise) 146/64       Blood Pressure (Exit) 128/64       Heart Rate (Admit) 84 bpm       Heart Rate (Exercise) 125 bpm       Heart Rate (Exit) 99 bpm       Oxygen Saturation (Admit) 97 %       Oxygen Saturation (Exercise) 98 %       Rating of Perceived Exertion (Exercise) 11       Symptoms none       Comments walk test results          Exercise Comments:   Exercise Goals and Review:     Exercise Goals    Row Name 11/14/16 1551             Exercise Goals   Increase Physical Activity Yes       Intervention Provide advice, education, support and counseling about physical activity/exercise needs.;Develop an individualized exercise prescription for aerobic and resistive training based on initial evaluation findings, risk stratification, comorbidities and participant's personal goals.       Expected Outcomes Achievement of increased cardiorespiratory fitness and enhanced flexibility, muscular endurance and strength shown through measurements of functional capacity and personal statement of participant.       Increase Strength and Stamina Yes       Intervention Provide advice, education, support and counseling about physical activity/exercise needs.;Develop  an  individualized exercise prescription for aerobic and resistive training based on initial evaluation findings, risk stratification, comorbidities and participant's personal goals.       Expected Outcomes Achievement of increased cardiorespiratory fitness and enhanced flexibility, muscular endurance and strength shown through measurements of functional capacity and personal statement of participant.       Able to understand and use rate of perceived exertion (RPE) scale Yes       Intervention Provide education and explanation on how to use RPE scale       Expected Outcomes Short Term: Able to use RPE daily in rehab to express subjective intensity level;Long Term:  Able to use RPE to guide intensity level when exercising independently       Able to understand and use Dyspnea scale Yes       Intervention Provide education and explanation on how to use Dyspnea scale       Expected Outcomes Short Term: Able to use Dyspnea scale daily in rehab to express subjective sense of shortness of breath during exertion;Long Term: Able to use Dyspnea scale to guide intensity level when exercising independently       Knowledge and understanding of Target Heart Rate Range (THRR) Yes       Intervention Provide education and explanation of THRR including how the numbers were predicted and where they are located for reference       Expected Outcomes Short Term: Able to state/look up THRR;Long Term: Able to use THRR to govern intensity when exercising independently;Short Term: Able to use daily as guideline for intensity in rehab       Able to check pulse independently Yes       Intervention Provide education and demonstration on how to check pulse in carotid and radial arteries.;Review the importance of being able to check your own pulse for safety during independent exercise       Expected Outcomes Short Term: Able to explain why pulse checking is important during independent exercise;Long Term: Able to check pulse  independently and accurately       Understanding of Exercise Prescription Yes       Intervention Provide education, explanation, and written materials on patient's individual exercise prescription       Expected Outcomes Short Term: Able to explain program exercise prescription;Long Term: Able to explain home exercise prescription to exercise independently          Exercise Goals Re-Evaluation :   Discharge Exercise Prescription (Final Exercise Prescription Changes):     Exercise Prescription Changes - 11/14/16 1500      Response to Exercise   Blood Pressure (Admit) 116/64   Blood Pressure (Exercise) 146/64   Blood Pressure (Exit) 128/64   Heart Rate (Admit) 84 bpm   Heart Rate (Exercise) 125 bpm   Heart Rate (Exit) 99 bpm   Oxygen Saturation (Admit) 97 %   Oxygen Saturation (Exercise) 98 %   Rating of Perceived Exertion (Exercise) 11   Symptoms none   Comments walk test results      Nutrition:  Target Goals: Understanding of nutrition guidelines, daily intake of sodium 1500mg , cholesterol 200mg , calories 30% from fat and 7% or less from saturated fats, daily to have 5 or more servings of fruits and vegetables.  Biometrics:     Pre Biometrics - 11/14/16 1552      Pre Biometrics   Height 5' 5.8" (1.671 m)   Weight 186 lb 1.6 oz (84.4 kg)   Waist Circumference 41 inches  Hip Circumference 40.5 inches   Waist to Hip Ratio 1.01 %   BMI (Calculated) 30.23   Single Leg Stand 7.67 seconds       Nutrition Therapy Plan and Nutrition Goals:     Nutrition Therapy & Goals - 11/14/16 1320      Nutrition Therapy   RD appointment defered Yes  Does not want to meet with the RD.       Nutrition Discharge: Rate Your Plate Scores:     Nutrition Assessments - 11/14/16 1320      MEDFICTS Scores   Pre Score --  Deferred completing the questionnaire      Nutrition Goals Re-Evaluation:   Nutrition Goals Discharge (Final Nutrition Goals  Re-Evaluation):   Psychosocial: Target Goals: Acknowledge presence or absence of significant depression and/or stress, maximize coping skills, provide positive support system. Participant is able to verbalize types and ability to use techniques and skills needed for reducing stress and depression.   Initial Review & Psychosocial Screening:     Initial Psych Review & Screening - 11/14/16 1322      Initial Review   Current issues with None Identified     Family Dynamics   Good Support System? Yes  Wife     Barriers   Psychosocial barriers to participate in program There are no identifiable barriers or psychosocial needs.;The patient should benefit from training in stress management and relaxation.     Screening Interventions   Interventions Encouraged to exercise  Declined to complete the QOL questionnaire       Quality of Life Scores:      Quality of Life - 11/14/16 1323      Quality of Life Scores   Health/Function Pre --  Declined to complete this questionnaire      PHQ-9: Recent Review Flowsheet Data    Depression screen [facility] 2/9 11/14/2016   Decreased Interest 0   Down, Depressed, Hopeless 0   PHQ - 2 Score 0   Altered sleeping 0   Tired, decreased energy 0   Change in appetite 0   Feeling bad or failure about yourself  0   Trouble concentrating 0   Moving slowly or fidgety/restless 0   Suicidal thoughts 0   PHQ-9 Score 0   Difficult doing work/chores Not difficult at all     Interpretation of Total Score  Total Score Depression Severity:  1-4 = Minimal depression, 5-9 = Mild depression, 10-14 = Moderate depression, 15-19 = Moderately severe depression, 20-27 = Severe depression   Psychosocial Evaluation and Intervention:   Psychosocial Re-Evaluation:   Psychosocial Discharge (Final Psychosocial Re-Evaluation):   Vocational Rehabilitation: Provide vocational rehab assistance to qualifying candidates.   Vocational Rehab Evaluation &  Intervention:     Vocational Rehab - 11/14/16 1325      Initial Vocational Rehab Evaluation & Intervention   Assessment shows need for Vocational Rehabilitation No      Education: Education Goals: Education classes will be provided on a variety of topics geared toward better understanding of heart health and risk factor modification. Participant will state understanding/return demonstration of topics presented as noted by education test scores.  Learning Barriers/Preferences:     Learning Barriers/Preferences - 11/14/16 1324      Learning Barriers/Preferences   Learning Barriers None   Learning Preferences None      Education Topics: General Nutrition Guidelines/Fats and Fiber: -Group instruction provided by verbal, written material, models and posters to present the general guidelines for heart healthy nutrition.  Gives an explanation and review of dietary fats and fiber.   Controlling Sodium/Reading Food Labels: -Group verbal and written material supporting the discussion of sodium use in heart healthy nutrition. Review and explanation with models, verbal and written materials for utilization of the food label.   Exercise Physiology & Risk Factors: - Group verbal and written instruction with models to review the exercise physiology of the cardiovascular system and associated critical values. Details cardiovascular disease risk factors and the goals associated with each risk factor.   Aerobic Exercise & Resistance Training: - Gives group verbal and written discussion on the health impact of inactivity. On the components of aerobic and resistive training programs and the benefits of this training and how to safely progress through these programs.   Flexibility, Balance, General Exercise Guidelines: - Provides group verbal and written instruction on the benefits of flexibility and balance training programs. Provides general exercise guidelines with specific guidelines to those  with heart or lung disease. Demonstration and skill practice provided.   Stress Management: - Provides group verbal and written instruction about the health risks of elevated stress, cause of high stress, and healthy ways to reduce stress.   Depression: - Provides group verbal and written instruction on the correlation between heart/lung disease and depressed mood, treatment options, and the stigmas associated with seeking treatment.   Anatomy & Physiology of the Heart: - Group verbal and written instruction and models provide basic cardiac anatomy and physiology, with the coronary electrical and arterial systems. Review of: AMI, Angina, Valve disease, Heart Failure, Cardiac Arrhythmia, Pacemakers, and the ICD.   Cardiac Procedures: - Group verbal and written instruction to review commonly prescribed medications for heart disease. Reviews the medication, class of the drug, and side effects. Includes the steps to properly store meds and maintain the prescription regimen. (beta blockers and nitrates)   Cardiac Medications I: - Group verbal and written instruction to review commonly prescribed medications for heart disease. Reviews the medication, class of the drug, and side effects. Includes the steps to properly store meds and maintain the prescription regimen.   Cardiac Medications II: -Group verbal and written instruction to review commonly prescribed medications for heart disease. Reviews the medication, class of the drug, and side effects. (all other drug classes)    Go Sex-Intimacy & Heart Disease, Get SMART - Goal Setting: - Group verbal and written instruction through game format to discuss heart disease and the return to sexual intimacy. Provides group verbal and written material to discuss and apply goal setting through the application of the S.M.A.R.T. Method.   Other Matters of the Heart: - Provides group verbal, written materials and models to describe Heart Failure,  Angina, Valve Disease, Peripheral Artery Disease, and Diabetes in the realm of heart disease. Includes description of the disease process and treatment options available to the cardiac patient.   Exercise & Equipment Safety: - Individual verbal instruction and demonstration of equipment use and safety with use of the equipment.   Cardiac Rehab from 11/14/2016 in [facility] Cardiac and Pulmonary Rehab  Date  11/14/16  Educator  Sb  Instruction Review Code  1- Verbalizes Understanding      Infection Prevention: - Provides verbal and written material to individual with discussion of infection control including proper hand washing and proper equipment cleaning during exercise session.   Cardiac Rehab from 11/14/2016 in [facility] Cardiac and Pulmonary Rehab  Date  11/14/16  Educator  Sb  Instruction Review Code  1- Verbalizes Understanding  Falls Prevention: - Provides verbal and written material to individual with discussion of falls prevention and safety.   Cardiac Rehab from 11/14/2016 in [facility] Cardiac and Pulmonary Rehab  Date  11/14/16  Educator  SB  Instruction Review Code  1- Verbalizes Understanding      Diabetes: - Individual verbal and written instruction to review signs/symptoms of diabetes, desired ranges of glucose level fasting, after meals and with exercise. Acknowledge that pre and post exercise glucose checks will be done for 3 sessions at entry of program.   Cardiac Rehab from 11/14/2016 in [facility] Cardiac and Pulmonary Rehab  Date  11/14/16  Educator  Sb  Instruction Review Code  1- Verbalizes Understanding      Other: -Provides group and verbal instruction on various topics (see comments)    Knowledge Questionnaire Score:     Knowledge Questionnaire Score - 11/14/16 1324      Knowledge Questionnaire Score   Pre Score Declined to complete this questionnaire      Core Components/Risk Factors/Patient Goals at Admission:     Personal Goals and Risk Factors at  Admission - 11/14/16 1321      Core Components/Risk Factors/Patient Goals on Admission    Weight Management Yes;Weight Loss   Intervention Weight Management: Develop a combined nutrition and exercise program designed to reach desired caloric intake, while maintaining appropriate intake of nutrient and fiber, sodium and fats, and appropriate energy expenditure required for the weight goal.;Weight Management: Provide education and appropriate resources to help participant work on and attain dietary goals.;Obesity: Provide education and appropriate resources to help participant work on and attain dietary goals.   Admit Weight 186 lb 1.6 oz (84.4 kg)   Goal Weight: Short Term 184 lb (83.5 kg)   Goal Weight: Long Term 180 lb (81.6 kg)   Expected Outcomes Short Term: Continue to assess and modify interventions until short term weight is achieved;Long Term: Adherence to nutrition and physical activity/exercise program aimed toward attainment of established weight goal   Diabetes Yes   Intervention Provide education about signs/symptoms and action to take for hypo/hyperglycemia.;Provide education about proper nutrition, including hydration, and aerobic/resistive exercise prescription along with prescribed medications to achieve blood glucose in normal ranges: Fasting glucose 65-99 mg/dL   Expected Outcomes Short Term: Participant verbalizes understanding of the signs/symptoms and immediate care of hyper/hypoglycemia, proper foot care and importance of medication, aerobic/resistive exercise and nutrition plan for blood glucose control.;Long Term: Attainment of HbA1C < 7%.   Hypertension Yes   Intervention Provide education on lifestyle modifcations including regular physical activity/exercise, weight management, moderate sodium restriction and increased consumption of fresh fruit, vegetables, and low fat dairy, alcohol moderation, and smoking cessation.;Monitor prescription use compliance.   Expected Outcomes  Short Term: Continued assessment and intervention until BP is < 140/57mm HG in hypertensive participants. < 130/13mm HG in hypertensive participants with diabetes, heart failure or chronic kidney disease.;Long Term: Maintenance of blood pressure at goal levels.   Lipids Yes   Intervention Provide education and support for participant on nutrition & aerobic/resistive exercise along with prescribed medications to achieve LDL 70mg , HDL >40mg .   Expected Outcomes Short Term: Participant states understanding of desired cholesterol values and is compliant with medications prescribed. Participant is following exercise prescription and nutrition guidelines.;Long Term: Cholesterol controlled with medications as prescribed, with individualized exercise RX and with personalized nutrition plan. Value goals: LDL < 70mg , HDL > 40 mg.      Core Components/Risk Factors/Patient Goals Review:    Core Components/Risk  Factors/Patient Goals at Discharge (Final Review):    ITP Comments:     ITP Comments    Row Name 11/14/16 1311           ITP Comments Medical review completed.  INitial ITP sent to Medical Director for review, changes if necessary and signature.   Diagnosis documentation can be found in CARE EVERYWHERE 10/17/2016 Office Visit          Comments: Initial ITP

## 2016-11-20 ENCOUNTER — Encounter: Payer: Self-pay | Admitting: *Deleted

## 2016-11-20 DIAGNOSIS — I214 Non-ST elevation (NSTEMI) myocardial infarction: Secondary | ICD-10-CM

## 2016-11-20 NOTE — Progress Notes (Signed)
Cardiac Individual Treatment Plan  Patient Details  Name: Larry Mann MRN: [id number] Date of Birth: January 21, 1951 Referring Provider:     Cardiac Rehab from 11/14/2016 in [facility] Cardiac and Pulmonary Rehab  Referring Provider  Serafina Royals MD      Initial Encounter Date:    Cardiac Rehab from 11/14/2016 in [facility] Cardiac and Pulmonary Rehab  Date  11/14/16  Referring Provider  Serafina Royals MD      Visit Diagnosis: NSTEMI (non-ST elevated myocardial infarction) [facility])  Patient's Home Medications on Admission:  Current Outpatient Prescriptions:  .  amLODipine (NORVASC) 10 MG tablet, Take 10 mg by mouth daily., Disp: , Rfl:  .  aspirin EC 81 MG tablet, Take 81 mg by mouth daily., Disp: , Rfl:  .  atorvastatin (LIPITOR) 20 MG tablet, Take 20 mg by mouth daily., Disp: , Rfl:  .  glipiZIDE (GLUCOTROL XL) 5 MG 24 hr tablet, Take 5 mg by mouth daily with breakfast., Disp: , Rfl:  .  losartan (COZAAR) 100 MG tablet, Take by mouth., Disp: , Rfl:  .  metFORMIN (GLUCOPHAGE) 1000 MG tablet, Take 1,000 mg by mouth 2 (two) times daily with a meal., Disp: , Rfl:  .  metoprolol tartrate (LOPRESSOR) 25 MG tablet, Take 25 mg by mouth 2 (two) times daily., Disp: , Rfl:  .  potassium chloride (MICRO-K) 10 MEQ CR capsule, Take by mouth., Disp: , Rfl:  .  ticagrelor (BRILINTA) 90 MG TABS tablet, Take by mouth., Disp: , Rfl:   Past Medical History: Past Medical History:  Diagnosis Date  . Anginal pain ([place])   . Basal cell carcinoma   . Cancer ([place])   . Chicken pox   . Chronic kidney disease   . Coronary artery disease   . Diabetes mellitus without complication ([place])   . Headache   . Hypertension   . Myocardial infarction ([place])   . Plantar fasciitis     Tobacco Use: History  Smoking Status  . Former Smoker  . Quit date: 05/19/2001  Smokeless Tobacco  . Never Used    Labs: Recent Review Flowsheet Data    There is no flowsheet data to display.       Exercise Target Goals:     Exercise Program Goal: Individual exercise prescription set with THRR, safety & activity barriers. Participant demonstrates ability to understand and report RPE using BORG scale, to self-measure pulse accurately, and to acknowledge the importance of the exercise prescription.  Exercise Prescription Goal: Starting with aerobic activity 30 plus minutes a day, 3 days per week for initial exercise prescription. Provide home exercise prescription and guidelines that participant acknowledges understanding prior to discharge.  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 HR 84 bpm     Max Ex. BP 146/64     2 Minute Post BP 128/64        Oxygen Initial Assessment:   Oxygen Re-Evaluation:   Oxygen Discharge (Final Oxygen Re-Evaluation):   Initial Exercise Prescription:     Initial Exercise Prescription - 11/14/16 1500      Date of Initial Exercise RX and Referring Provider   Date 11/14/16   Referring Provider Serafina Royals MD     Treadmill   MPH 2.9   Grade 2   Minutes 15   METs 4.02     NuStep   Level 3   SPM 80   Minutes 15   METs 3     REL-XR   Level 2   Speed 50   Minutes 15   METs 3     Prescription Details   Frequency (times per week) 2    Duration Progress to 45 minutes of aerobic exercise without signs/symptoms of physical distress     Intensity   THRR 40-80% of Max Heartrate 113-142   Ratings of Perceived Exertion 11-13   Perceived Dyspnea 0-4     Progression   Progression Continue to progress workloads to maintain intensity without signs/symptoms of physical distress.     Resistance Training   Training Prescription Yes   Weight 3 lbs   Reps 10-15      Perform Capillary Blood Glucose checks as needed.  Exercise Prescription Changes:     Exercise Prescription Changes    Row Name 11/14/16 1500             Response to Exercise   Blood Pressure (Admit) 116/64       Blood Pressure (Exercise) 146/64       Blood Pressure (Exit) 128/64       Heart Rate (Admit) 84 bpm       Heart Rate (Exercise) 125 bpm       Heart Rate (Exit) 99 bpm       Oxygen Saturation (Admit) 97 %       Oxygen Saturation (Exercise) 98 %       Rating of Perceived Exertion (Exercise) 11       Symptoms none       Comments walk test results          Exercise Comments:   Exercise Goals and Review:     Exercise Goals    Row Name 11/14/16 1551             Exercise Goals   Increase Physical Activity Yes       Intervention Provide advice, education, support and counseling about physical activity/exercise needs.;Develop an individualized exercise prescription for aerobic and resistive training based on initial evaluation findings, risk stratification, comorbidities and participant's personal goals.       Expected Outcomes Achievement of increased cardiorespiratory fitness and enhanced flexibility, muscular endurance and strength shown through measurements of functional capacity and personal statement of participant.       Increase Strength and Stamina Yes       Intervention Provide advice, education, support and counseling about physical activity/exercise needs.;Develop an individualized exercise prescription for aerobic and  resistive training based on initial evaluation findings, risk stratification, comorbidities and participant's personal goals.       Expected Outcomes Achievement of increased cardiorespiratory fitness and enhanced flexibility, muscular endurance and strength shown through measurements of functional capacity and personal statement of participant.       Able to understand and use rate of perceived exertion (RPE) scale Yes       Intervention Provide education and explanation on how to use RPE scale       Expected Outcomes Short Term: Able to use RPE daily in rehab to express subjective intensity level;Long Term:  Able to use RPE to guide intensity level when exercising independently       Able to understand and use Dyspnea scale Yes       Intervention Provide education and explanation on how to use Dyspnea scale       Expected Outcomes Short Term: Able to use Dyspnea scale daily in rehab to express subjective sense of shortness of breath during exertion;Long Term: Able to use Dyspnea scale to guide intensity level when exercising independently       Knowledge and understanding of Target Heart Rate Range (THRR) Yes       Intervention Provide education and explanation of THRR including how the numbers were predicted and where they are located for reference       Expected Outcomes Short Term: Able to state/look up THRR;Long Term: Able to use THRR to govern intensity when exercising independently;Short Term: Able to use daily as guideline for intensity in rehab       Able to check pulse independently Yes       Intervention Provide education and demonstration on how to check pulse in carotid and radial arteries.;Review the importance of being able to check your own pulse for safety during independent exercise       Expected Outcomes Short Term: Able to explain why pulse checking is important during independent exercise;Long Term: Able to check pulse independently and accurately       Understanding of Exercise  Prescription Yes       Intervention Provide education, explanation, and written materials on patient's individual exercise prescription       Expected Outcomes Short Term: Able to explain program exercise prescription;Long Term: Able to explain home exercise prescription to exercise independently          Exercise Goals Re-Evaluation :   Discharge Exercise Prescription (Final Exercise Prescription Changes):     Exercise Prescription Changes - 11/14/16 1500      Response to Exercise   Blood Pressure (Admit) 116/64   Blood Pressure (Exercise) 146/64   Blood Pressure (Exit) 128/64   Heart Rate (Admit) 84 bpm   Heart Rate (Exercise) 125 bpm   Heart Rate (Exit) 99 bpm   Oxygen Saturation (Admit) 97 %   Oxygen Saturation (Exercise) 98 %   Rating of Perceived Exertion (Exercise) 11   Symptoms none   Comments walk test results      Nutrition:  Target Goals: Understanding of nutrition guidelines, daily intake of sodium 1500mg , cholesterol 200mg , calories 30% from fat and 7% or less from saturated fats, daily to have 5 or more servings of fruits and vegetables.  Biometrics:     Pre Biometrics - 11/14/16 1552      Pre Biometrics   Height 5' 5.8" (1.671 m)   Weight 186 lb 1.6 oz (84.4 kg)   Waist Circumference 41 inches   Hip Circumference 40.5 inches  Waist to Hip Ratio 1.01 %   BMI (Calculated) 30.23   Single Leg Stand 7.67 seconds       Nutrition Therapy Plan and Nutrition Goals:     Nutrition Therapy & Goals - 11/14/16 1320      Nutrition Therapy   RD appointment defered Yes  Does not want to meet with the RD.       Nutrition Discharge: Rate Your Plate Scores:     Nutrition Assessments - 11/14/16 1320      MEDFICTS Scores   Pre Score --  Deferred completing the questionnaire      Nutrition Goals Re-Evaluation:   Nutrition Goals Discharge (Final Nutrition Goals Re-Evaluation):   Psychosocial: Target Goals: Acknowledge presence or absence of  significant depression and/or stress, maximize coping skills, provide positive support system. Participant is able to verbalize types and ability to use techniques and skills needed for reducing stress and depression.   Initial Review & Psychosocial Screening:     Initial Psych Review & Screening - 11/14/16 1322      Initial Review   Current issues with None Identified     Family Dynamics   Good Support System? Yes  Wife     Barriers   Psychosocial barriers to participate in program There are no identifiable barriers or psychosocial needs.;The patient should benefit from training in stress management and relaxation.     Screening Interventions   Interventions Encouraged to exercise  Declined to complete the QOL questionnaire       Quality of Life Scores:      Quality of Life - 11/14/16 1323      Quality of Life Scores   Health/Function Pre --  Declined to complete this questionnaire      PHQ-9: Recent Review Flowsheet Data    Depression screen [facility] 2/9 11/14/2016   Decreased Interest 0   Down, Depressed, Hopeless 0   PHQ - 2 Score 0   Altered sleeping 0   Tired, decreased energy 0   Change in appetite 0   Feeling bad or failure about yourself  0   Trouble concentrating 0   Moving slowly or fidgety/restless 0   Suicidal thoughts 0   PHQ-9 Score 0   Difficult doing work/chores Not difficult at all     Interpretation of Total Score  Total Score Depression Severity:  1-4 = Minimal depression, 5-9 = Mild depression, 10-14 = Moderate depression, 15-19 = Moderately severe depression, 20-27 = Severe depression   Psychosocial Evaluation and Intervention:   Psychosocial Re-Evaluation:   Psychosocial Discharge (Final Psychosocial Re-Evaluation):   Vocational Rehabilitation: Provide vocational rehab assistance to qualifying candidates.   Vocational Rehab Evaluation & Intervention:     Vocational Rehab - 11/14/16 1325      Initial Vocational Rehab Evaluation &  Intervention   Assessment shows need for Vocational Rehabilitation No      Education: Education Goals: Education classes will be provided on a variety of topics geared toward better understanding of heart health and risk factor modification. Participant will state understanding/return demonstration of topics presented as noted by education test scores.  Learning Barriers/Preferences:     Learning Barriers/Preferences - 11/14/16 1324      Learning Barriers/Preferences   Learning Barriers None   Learning Preferences None      Education Topics: General Nutrition Guidelines/Fats and Fiber: -Group instruction provided by verbal, written material, models and posters to present the general guidelines for heart healthy nutrition. Gives an explanation and review of  dietary fats and fiber.   Controlling Sodium/Reading Food Labels: -Group verbal and written material supporting the discussion of sodium use in heart healthy nutrition. Review and explanation with models, verbal and written materials for utilization of the food label.   Exercise Physiology & Risk Factors: - Group verbal and written instruction with models to review the exercise physiology of the cardiovascular system and associated critical values. Details cardiovascular disease risk factors and the goals associated with each risk factor.   Aerobic Exercise & Resistance Training: - Gives group verbal and written discussion on the health impact of inactivity. On the components of aerobic and resistive training programs and the benefits of this training and how to safely progress through these programs.   Flexibility, Balance, General Exercise Guidelines: - Provides group verbal and written instruction on the benefits of flexibility and balance training programs. Provides general exercise guidelines with specific guidelines to those with heart or lung disease. Demonstration and skill practice provided.   Stress Management: -  Provides group verbal and written instruction about the health risks of elevated stress, cause of high stress, and healthy ways to reduce stress.   Depression: - Provides group verbal and written instruction on the correlation between heart/lung disease and depressed mood, treatment options, and the stigmas associated with seeking treatment.   Anatomy & Physiology of the Heart: - Group verbal and written instruction and models provide basic cardiac anatomy and physiology, with the coronary electrical and arterial systems. Review of: AMI, Angina, Valve disease, Heart Failure, Cardiac Arrhythmia, Pacemakers, and the ICD.   Cardiac Procedures: - Group verbal and written instruction to review commonly prescribed medications for heart disease. Reviews the medication, class of the drug, and side effects. Includes the steps to properly store meds and maintain the prescription regimen. (beta blockers and nitrates)   Cardiac Medications I: - Group verbal and written instruction to review commonly prescribed medications for heart disease. Reviews the medication, class of the drug, and side effects. Includes the steps to properly store meds and maintain the prescription regimen.   Cardiac Medications II: -Group verbal and written instruction to review commonly prescribed medications for heart disease. Reviews the medication, class of the drug, and side effects. (all other drug classes)    Go Sex-Intimacy & Heart Disease, Get SMART - Goal Setting: - Group verbal and written instruction through game format to discuss heart disease and the return to sexual intimacy. Provides group verbal and written material to discuss and apply goal setting through the application of the S.M.A.R.T. Method.   Other Matters of the Heart: - Provides group verbal, written materials and models to describe Heart Failure, Angina, Valve Disease, Peripheral Artery Disease, and Diabetes in the realm of heart disease. Includes  description of the disease process and treatment options available to the cardiac patient.   Exercise & Equipment Safety: - Individual verbal instruction and demonstration of equipment use and safety with use of the equipment.   Cardiac Rehab from 11/14/2016 in [facility] Cardiac and Pulmonary Rehab  Date  11/14/16  Educator  Sb  Instruction Review Code  1- Verbalizes Understanding      Infection Prevention: - Provides verbal and written material to individual with discussion of infection control including proper hand washing and proper equipment cleaning during exercise session.   Cardiac Rehab from 11/14/2016 in [facility] Cardiac and Pulmonary Rehab  Date  11/14/16  Educator  Sb  Instruction Review Code  1- Verbalizes Understanding      Falls Prevention: - Provides  verbal and written material to individual with discussion of falls prevention and safety.   Cardiac Rehab from 11/14/2016 in [facility] Cardiac and Pulmonary Rehab  Date  11/14/16  Educator  SB  Instruction Review Code  1- Verbalizes Understanding      Diabetes: - Individual verbal and written instruction to review signs/symptoms of diabetes, desired ranges of glucose level fasting, after meals and with exercise. Acknowledge that pre and post exercise glucose checks will be done for 3 sessions at entry of program.   Cardiac Rehab from 11/14/2016 in [facility] Cardiac and Pulmonary Rehab  Date  11/14/16  Educator  Sb  Instruction Review Code  1- Verbalizes Understanding      Other: -Provides group and verbal instruction on various topics (see comments)    Knowledge Questionnaire Score:     Knowledge Questionnaire Score - 11/14/16 1324      Knowledge Questionnaire Score   Pre Score Declined to complete this questionnaire      Core Components/Risk Factors/Patient Goals at Admission:     Personal Goals and Risk Factors at Admission - 11/14/16 1321      Core Components/Risk Factors/Patient Goals on Admission    Weight Management  Yes;Weight Loss   Intervention Weight Management: Develop a combined nutrition and exercise program designed to reach desired caloric intake, while maintaining appropriate intake of nutrient and fiber, sodium and fats, and appropriate energy expenditure required for the weight goal.;Weight Management: Provide education and appropriate resources to help participant work on and attain dietary goals.;Obesity: Provide education and appropriate resources to help participant work on and attain dietary goals.   Admit Weight 186 lb 1.6 oz (84.4 kg)   Goal Weight: Short Term 184 lb (83.5 kg)   Goal Weight: Long Term 180 lb (81.6 kg)   Expected Outcomes Short Term: Continue to assess and modify interventions until short term weight is achieved;Long Term: Adherence to nutrition and physical activity/exercise program aimed toward attainment of established weight goal   Diabetes Yes   Intervention Provide education about signs/symptoms and action to take for hypo/hyperglycemia.;Provide education about proper nutrition, including hydration, and aerobic/resistive exercise prescription along with prescribed medications to achieve blood glucose in normal ranges: Fasting glucose 65-99 mg/dL   Expected Outcomes Short Term: Participant verbalizes understanding of the signs/symptoms and immediate care of hyper/hypoglycemia, proper foot care and importance of medication, aerobic/resistive exercise and nutrition plan for blood glucose control.;Long Term: Attainment of HbA1C < 7%.   Hypertension Yes   Intervention Provide education on lifestyle modifcations including regular physical activity/exercise, weight management, moderate sodium restriction and increased consumption of fresh fruit, vegetables, and low fat dairy, alcohol moderation, and smoking cessation.;Monitor prescription use compliance.   Expected Outcomes Short Term: Continued assessment and intervention until BP is < 140/80mm HG in hypertensive participants. <  130/60mm HG in hypertensive participants with diabetes, heart failure or chronic kidney disease.;Long Term: Maintenance of blood pressure at goal levels.   Lipids Yes   Intervention Provide education and support for participant on nutrition & aerobic/resistive exercise along with prescribed medications to achieve LDL 70mg , HDL >40mg .   Expected Outcomes Short Term: Participant states understanding of desired cholesterol values and is compliant with medications prescribed. Participant is following exercise prescription and nutrition guidelines.;Long Term: Cholesterol controlled with medications as prescribed, with individualized exercise RX and with personalized nutrition plan. Value goals: LDL < 70mg , HDL > 40 mg.      Core Components/Risk Factors/Patient Goals Review:    Core Components/Risk Factors/Patient Goals at Discharge (  Final Review):    ITP Comments:     ITP Comments    Row Name 11/14/16 1311 11/20/16 1124         ITP Comments Medical review completed.  INitial ITP sent to Medical Director for review, changes if necessary and signature.   Diagnosis documentation can be found in CARE EVERYWHERE 10/17/2016 Office Visit 30 day review. Continue with ITP unless directed changes per Medical Director review.           Comments:

## 2016-11-26 ENCOUNTER — Encounter: Payer: Commercial Managed Care - HMO | Admitting: *Deleted

## 2016-11-26 DIAGNOSIS — Z955 Presence of coronary angioplasty implant and graft: Secondary | ICD-10-CM

## 2016-11-26 DIAGNOSIS — I214 Non-ST elevation (NSTEMI) myocardial infarction: Secondary | ICD-10-CM | POA: Diagnosis not present

## 2016-11-26 LAB — GLUCOSE, CAPILLARY
GLUCOSE-CAPILLARY: 286 mg/dL — AB (ref 65–99)
GLUCOSE-CAPILLARY: 250 mg/dL — AB (ref 65–99)

## 2016-11-26 NOTE — Progress Notes (Signed)
Daily Session Note  Patient Details  Name: Larry Mann MRN: [id number] Date of Birth: Sep 21, 1950 Referring Provider:     Cardiac Rehab from 11/14/2016 in [facility] Cardiac and Pulmonary Rehab  Referring Provider  Serafina Royals MD      Encounter Date: 11/26/2016  Check In:     Session Check In - 11/26/16 0928      Check-In   Location [facility]-Cardiac & Pulmonary Rehab   Staff Present Heath Lark, RN, BSN, CCRP;Rhandi Despain Luan Pulling, MA, ACSM RCEP, Exercise Physiologist;Amanda Oletta Darter, BA, ACSM CEP, Exercise Physiologist   Supervising physician immediately available to respond to emergencies See telemetry face sheet for immediately available ER MD   Medication changes reported     No   Fall or balance concerns reported    No   Warm-up and Cool-down Performed on first and last piece of equipment   Resistance Training Performed Yes   VAD Patient? No     Pain Assessment   Currently in Pain? No/denies   Multiple Pain Sites No         History  Smoking Status  . Former Smoker  . Quit date: 05/19/2001  Smokeless Tobacco  . Never Used    Goals Met:  Exercise tolerated well Personal goals reviewed No report of cardiac concerns or symptoms Strength training completed today  Goals Unmet:  Not Applicable  Comments: First full day of exercise!  Patient was oriented to gym and equipment including functions, settings, policies, and procedures.  Patient's individual exercise prescription and treatment plan were reviewed.  All starting workloads were established based on the results of the 6 minute walk test done at initial orientation visit.  The plan for exercise progression was also introduced and progression will be customized based on patient's performance and goals.    Dr. Emily Filbert is Medical Director for [place] and [facility].

## 2016-11-28 DIAGNOSIS — I214 Non-ST elevation (NSTEMI) myocardial infarction: Secondary | ICD-10-CM

## 2016-11-28 DIAGNOSIS — Z955 Presence of coronary angioplasty implant and graft: Secondary | ICD-10-CM

## 2016-11-28 NOTE — Progress Notes (Signed)
Daily Session Note  Patient Details  Name: Larry Mann MRN: [id number] Date of Birth: [birth year] Referring Provider:     Cardiac Rehab from 11/14/2016 in [facility] Cardiac and Pulmonary Rehab  Referring Provider  Serafina Royals MD      Encounter Date: 11/28/2016  Check In:     Session Check In - 11/28/16 0935      Check-In   Location [facility]-Cardiac & Pulmonary Rehab   Staff Present Alberteen Sam, MA, ACSM RCEP, Exercise Physiologist;Amanda Oletta Darter, BA, ACSM CEP, Exercise Physiologist;Meredith Sherryll Burger, RN BSN   Supervising physician immediately available to respond to emergencies See telemetry face sheet for immediately available ER MD   Medication changes reported     No   Fall or balance concerns reported    No   Warm-up and Cool-down Performed on first and last piece of equipment   Resistance Training Performed Yes   VAD Patient? No     Pain Assessment   Currently in Pain? No/denies         History  Smoking Status  . Former Smoker  . Quit date: 05/19/2001  Smokeless Tobacco  . Never Used    Goals Met:  Independence with exercise equipment Exercise tolerated well No report of cardiac concerns or symptoms Strength training completed today  Goals Unmet:  Not Applicable  Comments: Pt able to follow exercise prescription today without complaint.  Will continue to monitor for progression.    Dr. Emily Filbert is Medical Director for [place] and [facility].

## 2016-12-03 DIAGNOSIS — I214 Non-ST elevation (NSTEMI) myocardial infarction: Secondary | ICD-10-CM

## 2016-12-03 DIAGNOSIS — Z955 Presence of coronary angioplasty implant and graft: Secondary | ICD-10-CM

## 2016-12-03 LAB — GLUCOSE, CAPILLARY
GLUCOSE-CAPILLARY: 178 mg/dL — AB (ref 65–99)
GLUCOSE-CAPILLARY: 167 mg/dL — AB (ref 65–99)

## 2016-12-03 NOTE — Progress Notes (Signed)
Daily Session Note  Patient Details  Name: Larry Mann MRN: [id number] Date of Birth: 1950/08/08 Referring Provider:     Cardiac Rehab from 11/14/2016 in [facility] Cardiac and Pulmonary Rehab  Referring Provider  Serafina Royals MD      Encounter Date: 12/03/2016  Check In:     Session Check In - 12/03/16 0843      Check-In   Location [facility]-Cardiac & Pulmonary Rehab   Staff Present Heath Lark, RN, BSN, CCRP;Jessica Luan Pulling, MA, ACSM RCEP, Exercise Physiologist;Jahseh Lucchese Oletta Darter, BA, ACSM CEP, Exercise Physiologist   Supervising physician immediately available to respond to emergencies See telemetry face sheet for immediately available ER MD   Medication changes reported     No   Fall or balance concerns reported    No   Warm-up and Cool-down Performed on first and last piece of equipment   Resistance Training Performed Yes   VAD Patient? No     Pain Assessment   Currently in Pain? No/denies         History  Smoking Status  . Former Smoker  . Quit date: 05/19/2001  Smokeless Tobacco  . Never Used    Goals Met:  Independence with exercise equipment Exercise tolerated well No report of cardiac concerns or symptoms Strength training completed today  Goals Unmet:  Not Applicable  Comments: Pt able to follow exercise prescription today without complaint.  Will continue to monitor for progression.    Dr. Emily Filbert is Medical Director for [place] and [facility].

## 2016-12-05 ENCOUNTER — Encounter: Payer: Commercial Managed Care - HMO | Admitting: *Deleted

## 2016-12-05 DIAGNOSIS — Z955 Presence of coronary angioplasty implant and graft: Secondary | ICD-10-CM

## 2016-12-05 DIAGNOSIS — I214 Non-ST elevation (NSTEMI) myocardial infarction: Secondary | ICD-10-CM | POA: Diagnosis not present

## 2016-12-05 NOTE — Progress Notes (Signed)
Daily Session Note  Patient Details  Name: Larry Mann MRN: [id number] Date of Birth: 02/01/1951 Referring Provider:     Cardiac Rehab from 11/14/2016 in [facility] Cardiac and Pulmonary Rehab  Referring Provider  Serafina Royals MD      Encounter Date: 12/05/2016  Check In:     Session Check In - 12/05/16 0901      Check-In   Location [facility]-Cardiac & Pulmonary Rehab   Staff Present Alberteen Sam, MA, ACSM RCEP, Exercise Physiologist;Amanda Oletta Darter, BA, ACSM CEP, Exercise Physiologist;Meredith Sherryll Burger, RN BSN   Supervising physician immediately available to respond to emergencies See telemetry face sheet for immediately available ER MD   Medication changes reported     No   Fall or balance concerns reported    No   Warm-up and Cool-down Performed on first and last piece of equipment   Resistance Training Performed Yes   VAD Patient? No     Pain Assessment   Currently in Pain? No/denies   Multiple Pain Sites No         History  Smoking Status  . Former Smoker  . Quit date: 05/19/2001  Smokeless Tobacco  . Never Used    Goals Met:  Independence with exercise equipment Exercise tolerated well Personal goals reviewed No report of cardiac concerns or symptoms Strength training completed today  Goals Unmet:  Not Applicable  Comments: Pt able to follow exercise prescription today without complaint.  Will continue to monitor for progression. Reviewed home exercise with pt today.  Pt plans to continue walking at home for exercise.  Reviewed THR, pulse, RPE, sign and symptoms, NTG use, and when to call 911 or MD.  Also discussed weather considerations and indoor options.  Pt voiced understanding.    Dr. Emily Filbert is Medical Director for [place] and [facility].

## 2016-12-10 ENCOUNTER — Encounter: Payer: Commercial Managed Care - HMO | Attending: Internal Medicine

## 2016-12-10 DIAGNOSIS — I214 Non-ST elevation (NSTEMI) myocardial infarction: Secondary | ICD-10-CM | POA: Insufficient documentation

## 2016-12-10 DIAGNOSIS — I10 Essential (primary) hypertension: Secondary | ICD-10-CM | POA: Insufficient documentation

## 2016-12-10 DIAGNOSIS — Z87891 Personal history of nicotine dependence: Secondary | ICD-10-CM | POA: Insufficient documentation

## 2016-12-10 DIAGNOSIS — Z7984 Long term (current) use of oral hypoglycemic drugs: Secondary | ICD-10-CM | POA: Insufficient documentation

## 2016-12-10 DIAGNOSIS — Z955 Presence of coronary angioplasty implant and graft: Secondary | ICD-10-CM | POA: Insufficient documentation

## 2016-12-10 DIAGNOSIS — Z79899 Other long term (current) drug therapy: Secondary | ICD-10-CM | POA: Insufficient documentation

## 2016-12-10 DIAGNOSIS — E119 Type 2 diabetes mellitus without complications: Secondary | ICD-10-CM | POA: Insufficient documentation

## 2016-12-12 DIAGNOSIS — Z79899 Other long term (current) drug therapy: Secondary | ICD-10-CM | POA: Diagnosis not present

## 2016-12-12 DIAGNOSIS — Z955 Presence of coronary angioplasty implant and graft: Secondary | ICD-10-CM

## 2016-12-12 DIAGNOSIS — I214 Non-ST elevation (NSTEMI) myocardial infarction: Secondary | ICD-10-CM | POA: Diagnosis present

## 2016-12-12 DIAGNOSIS — I10 Essential (primary) hypertension: Secondary | ICD-10-CM | POA: Diagnosis not present

## 2016-12-12 DIAGNOSIS — Z87891 Personal history of nicotine dependence: Secondary | ICD-10-CM | POA: Diagnosis not present

## 2016-12-12 DIAGNOSIS — E119 Type 2 diabetes mellitus without complications: Secondary | ICD-10-CM | POA: Diagnosis not present

## 2016-12-12 DIAGNOSIS — Z7984 Long term (current) use of oral hypoglycemic drugs: Secondary | ICD-10-CM | POA: Diagnosis not present

## 2016-12-12 LAB — GLUCOSE, CAPILLARY
Glucose-Capillary: 232 mg/dL — ABNORMAL HIGH (ref 65–99)
Glucose-Capillary: 187 mg/dL — ABNORMAL HIGH (ref 65–99)

## 2016-12-12 NOTE — Progress Notes (Signed)
Daily Session Note  Patient Details  Name: Larry Mann MRN: [id number] Date of Birth: 1950-06-26 Referring Provider:     Cardiac Rehab from 11/14/2016 in [facility] Cardiac and Pulmonary Rehab  Referring Provider  Serafina Royals MD      Encounter Date: 12/12/2016  Check In:     Session Check In - 12/12/16 0828      Check-In   Location [facility]-Cardiac & Pulmonary Rehab   Staff Present Alberteen Sam, MA, ACSM RCEP, Exercise Physiologist;Jabbar Palmero Oletta Darter, BA, ACSM CEP, Exercise Physiologist;Meredith Sherryll Burger, RN BSN   Supervising physician immediately available to respond to emergencies See telemetry face sheet for immediately available ER MD   Medication changes reported     No   Fall or balance concerns reported    No   Warm-up and Cool-down Performed on first and last piece of equipment   Resistance Training Performed Yes   VAD Patient? No     Pain Assessment   Currently in Pain? No/denies         History  Smoking Status  . Former Smoker  . Quit date: 05/19/2001  Smokeless Tobacco  . Never Used    Goals Met:  Independence with exercise equipment Exercise tolerated well Strength training completed today  Goals Unmet:  Not Applicable  Comments: Pt able to follow exercise prescription today without complaint.  Will continue to monitor for progression.   Dr. Emily Filbert is Medical Director for [place] and [facility].

## 2016-12-17 DIAGNOSIS — Z955 Presence of coronary angioplasty implant and graft: Secondary | ICD-10-CM

## 2016-12-17 DIAGNOSIS — I214 Non-ST elevation (NSTEMI) myocardial infarction: Secondary | ICD-10-CM | POA: Diagnosis not present

## 2016-12-17 NOTE — Progress Notes (Signed)
Daily Session Note  Patient Details  Name: Larry Mann MRN: [id number] Date of Birth: Aug 11, 1950 Referring Provider:     Cardiac Rehab from 11/14/2016 in [facility] Cardiac and Pulmonary Rehab  Referring Provider  Serafina Royals MD      Encounter Date: 12/17/2016  Check In:     Session Check In - 12/17/16 0859      Check-In   Location [facility]-Cardiac & Pulmonary Rehab   Staff Present Gerlene Burdock, RN, BSN;Latonia Conrow Luan Pulling, MA, ACSM RCEP, Exercise Physiologist;Amanda Oletta Darter, BA, ACSM CEP, Exercise Physiologist   Supervising physician immediately available to respond to emergencies See telemetry face sheet for immediately available ER MD   Medication changes reported     No   Fall or balance concerns reported    No   Warm-up and Cool-down Performed on first and last piece of equipment   Resistance Training Performed Yes   VAD Patient? No     Pain Assessment   Currently in Pain? No/denies   Multiple Pain Sites No         History  Smoking Status  . Former Smoker  . Quit date: 05/19/2001  Smokeless Tobacco  . Never Used    Goals Met:  Independence with exercise equipment Exercise tolerated well No report of cardiac concerns or symptoms Strength training completed today  Goals Unmet:  Not Applicable  Comments: Pt able to follow exercise prescription today without complaint.  Will continue to monitor for progression.    Dr. Emily Filbert is Medical Director for [place] and [facility].

## 2016-12-18 ENCOUNTER — Encounter: Payer: Self-pay | Admitting: *Deleted

## 2016-12-18 NOTE — Progress Notes (Signed)
Cardiac Individual Treatment Plan  Patient Details  Name: Larry Mann MRN: [id number] Date of Birth: 02/02/1951 Referring Provider:     Cardiac Rehab from 11/14/2016 in [facility] Cardiac and Pulmonary Rehab  Referring Provider  Serafina Royals MD      Initial Encounter Date:    Cardiac Rehab from 11/14/2016 in [facility] Cardiac and Pulmonary Rehab  Date  11/14/16  Referring Provider  Serafina Royals MD      Visit Diagnosis: No diagnosis found.  Patient's Home Medications on Admission:  Current Outpatient Prescriptions:  .  amLODipine (NORVASC) 10 MG tablet, Take 10 mg by mouth daily., Disp: , Rfl:  .  aspirin EC 81 MG tablet, Take 81 mg by mouth daily., Disp: , Rfl:  .  atorvastatin (LIPITOR) 20 MG tablet, Take 20 mg by mouth daily., Disp: , Rfl:  .  glipiZIDE (GLUCOTROL XL) 5 MG 24 hr tablet, Take 5 mg by mouth daily with breakfast., Disp: , Rfl:  .  losartan (COZAAR) 100 MG tablet, Take by mouth., Disp: , Rfl:  .  metFORMIN (GLUCOPHAGE) 1000 MG tablet, Take 1,000 mg by mouth 2 (two) times daily with a meal., Disp: , Rfl:  .  metoprolol tartrate (LOPRESSOR) 25 MG tablet, Take 25 mg by mouth 2 (two) times daily., Disp: , Rfl:  .  potassium chloride (MICRO-K) 10 MEQ CR capsule, Take by mouth., Disp: , Rfl:  .  ticagrelor (BRILINTA) 90 MG TABS tablet, Take by mouth., Disp: , Rfl:   Past Medical History: Past Medical History:  Diagnosis Date  . Anginal pain ([place])   . Basal cell carcinoma   . Cancer ([place])   . Chicken pox   . Chronic kidney disease   . Coronary artery disease   . Diabetes mellitus without complication ([place])   . Headache   . Hypertension   . Myocardial infarction ([place])   . Plantar fasciitis     Tobacco Use: History  Smoking Status  . Former Smoker  . Quit date: 05/19/2001  Smokeless Tobacco  . Never Used    Labs: Recent Review Flowsheet Data    There is no flowsheet data to display.       Exercise Target Goals:    Exercise Program  Goal: Individual exercise prescription set with THRR, safety & activity barriers. Participant demonstrates ability to understand and report RPE using BORG scale, to self-measure pulse accurately, and to acknowledge the importance of the exercise prescription.  Exercise Prescription Goal: Starting with aerobic activity 30 plus minutes a day, 3 days per week for initial exercise prescription. Provide home exercise prescription and guidelines that participant acknowledges understanding prior to discharge.  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 HR 84 bpm     Max Ex. BP 146/64     2 Minute Post BP 128/64        Oxygen Initial Assessment:   Oxygen Re-Evaluation:   Oxygen Discharge (Final Oxygen Re-Evaluation):   Initial Exercise Prescription:     Initial Exercise Prescription - 11/14/16 1500      Date of Initial Exercise RX and Referring Provider   Date 11/14/16   Referring Provider Serafina Royals MD     Treadmill   MPH 2.9   Grade 2   Minutes 15   METs 4.02     NuStep   Level 3   SPM 80   Minutes 15   METs 3     REL-XR   Level 2   Speed 50   Minutes 15   METs 3     Prescription Details   Frequency (times per week) 2   Duration Progress to  45 minutes of aerobic exercise without signs/symptoms of physical distress     Intensity   THRR 40-80% of Max Heartrate 113-142   Ratings of Perceived Exertion 11-13   Perceived Dyspnea 0-4     Progression   Progression Continue to progress workloads to maintain intensity without signs/symptoms of physical distress.     Resistance Training   Training Prescription Yes   Weight 3 lbs   Reps 10-15      Perform Capillary Blood Glucose checks as needed.  Exercise Prescription Changes:     Exercise Prescription Changes    Row Name 11/14/16 1500 11/26/16 1400 12/05/16 0900 12/13/16 1000       Response to Exercise   Blood Pressure (Admit) 116/64 138/74  - 120/70    Blood Pressure (Exercise) 146/64 152/64  - 140/64    Blood Pressure (Exit) 128/64 134/60  - 124/60    Heart Rate (Admit) 84 bpm 99 bpm  - 84 bpm    Heart Rate (Exercise) 125 bpm 112 bpm  - 113 bpm    Heart Rate (Exit) 99 bpm 84 bpm  - 84 bpm    Oxygen Saturation (Admit) 97 %  -  -  -    Oxygen Saturation (Exercise) 98 %  -  -  -    Rating of Perceived Exertion (Exercise) 11 12  - 12    Symptoms none none  - none    Comments walk test results first full day of exercise  -  -    Duration  - Progress to 45 minutes of aerobic exercise without signs/symptoms of physical distress  - Continue with 45 min of aerobic exercise without signs/symptoms of physical distress.    Intensity  - THRR unchanged  - THRR unchanged      Progression   Progression  - Continue to progress workloads to maintain intensity without signs/symptoms of physical distress.  - Continue to progress workloads to maintain intensity without signs/symptoms of physical distress.    Average METs  - 2.7  - 3.75      Resistance Training   Training Prescription  - Yes  - Yes    Weight  - 4 lbs  - 4 lbs    Reps  - 10-15  - 10-15  Interval Training   Interval Training  - No  - No      Treadmill   MPH  -  -  - 3    Grade  -  -  - 3    Minutes  -  -  -  15    METs  -  -  - 4.54      NuStep   Level  - 3  - 3    Minutes  - 15  - 15    METs  - 2.6  - 3.9      REL-XR   Level  - 2  - 2    Minutes  - 15  - 15    METs  - 2.8  - 2.8      Home Exercise Plan   Plans to continue exercise at  -  - Home (comment)  walking Home (comment)  walking    Frequency  -  - Add 4 additional days to program exercise sessions. Add 4 additional days to program exercise sessions.    Initial Home Exercises Provided  -  - 12/05/16 12/05/16       Exercise Comments:     Exercise Comments    Row Name 11/26/16 [phone]           Exercise Comments First full day of exercise!  Patient was oriented to gym and equipment including functions, settings, policies, and procedures.  Patient's individual exercise prescription and treatment plan were reviewed.  All starting workloads were established based on the results of the 6 minute walk test done at initial orientation visit.  The plan for exercise progression was also introduced and progression will be customized based on patient's performance and goals.          Exercise Goals and Review:     Exercise Goals    Row Name 11/14/16 1551             Exercise Goals   Increase Physical Activity Yes       Intervention Provide advice, education, support and counseling about physical activity/exercise needs.;Develop an individualized exercise prescription for aerobic and resistive training based on initial evaluation findings, risk stratification, comorbidities and participant's personal goals.       Expected Outcomes Achievement of increased cardiorespiratory fitness and enhanced flexibility, muscular endurance and strength shown through measurements of functional capacity and personal statement of participant.       Increase Strength and Stamina Yes       Intervention Provide advice, education, support and counseling about physical activity/exercise needs.;Develop an individualized exercise prescription for aerobic  and resistive training based on initial evaluation findings, risk stratification, comorbidities and participant's personal goals.       Expected Outcomes Achievement of increased cardiorespiratory fitness and enhanced flexibility, muscular endurance and strength shown through measurements of functional capacity and personal statement of participant.       Able to understand and use rate of perceived exertion (RPE) scale Yes       Intervention Provide education and explanation on how to use RPE scale       Expected Outcomes Short Term: Able to use RPE daily in rehab to express subjective intensity level;Long Term:  Able to use RPE to guide intensity level when exercising independently       Able to understand and use Dyspnea scale Yes       Intervention Provide education and explanation on how to use Dyspnea scale  Expected Outcomes Short Term: Able to use Dyspnea scale daily in rehab to express subjective sense of shortness of breath during exertion;Long Term: Able to use Dyspnea scale to guide intensity level when exercising independently       Knowledge and understanding of Target Heart Rate Range (THRR) Yes       Intervention Provide education and explanation of THRR including how the numbers were predicted and where they are located for reference       Expected Outcomes Short Term: Able to state/look up THRR;Long Term: Able to use THRR to govern intensity when exercising independently;Short Term: Able to use daily as guideline for intensity in rehab       Able to check pulse independently Yes       Intervention Provide education and demonstration on how to check pulse in carotid and radial arteries.;Review the importance of being able to check your own pulse for safety during independent exercise       Expected Outcomes Short Term: Able to explain why pulse checking is important during independent exercise;Long Term: Able to check pulse independently and accurately       Understanding of  Exercise Prescription Yes       Intervention Provide education, explanation, and written materials on patient's individual exercise prescription       Expected Outcomes Short Term: Able to explain program exercise prescription;Long Term: Able to explain home exercise prescription to exercise independently          Exercise Goals Re-Evaluation :     Exercise Goals Re-Evaluation    Row Name 11/26/16 0929 12/05/16 0902 12/13/16 1042         Exercise Goal Re-Evaluation   Exercise Goals Review Able to understand and use rate of perceived exertion (RPE) scale;Knowledge and understanding of Target Heart Rate Range (THRR);Understanding of Exercise Prescription Increase Physical Activity;Increase Strength and Stamina;Able to understand and use rate of perceived exertion (RPE) scale;Knowledge and understanding of Target Heart Rate Range (THRR);Able to check pulse independently;Understanding of Exercise Prescription Increase Physical Activity;Increase Strength and Stamina     Comments Reviewed RPE scale, THR and program prescription with pt today.  Pt voiced understanding and was given a copy of goals to take home.  [name] is off to a good start in rehab.  He has been walking some at home.  Reviewed home exercise with pt today.  Pt plans to continue walking at home for exercise.  Reviewed THR, pulse, RPE, sign and symptoms, NTG use, and when to call 911 or MD.  Also discussed weather considerations and indoor options.  Pt voiced understanding. He feels that his strength and stamina are getting better.  [name] continues to do well in rehab.  He is now up to 3.0 mph with 3% grade on the treadmill.  He is making progress even though he did not really want to come to rehab.  We will continue to monitor his progress.     Expected Outcomes Short: Use RPE daily to regulate intensity.  Long: Follow program prescription in THR. Short: Continue to walk at home on his off days and monitor his heart rate.  Long: Continue to  work on Office manager.  Short: Talk about adding in intervals.  Long: Continue to exercise on off days at home.         Discharge Exercise Prescription (Final Exercise Prescription Changes):     Exercise Prescription Changes - 12/13/16 1000      Response to Exercise  Blood Pressure (Admit) 120/70   Blood Pressure (Exercise) 140/64   Blood Pressure (Exit) 124/60   Heart Rate (Admit) 84 bpm   Heart Rate (Exercise) 113 bpm   Heart Rate (Exit) 84 bpm   Rating of Perceived Exertion (Exercise) 12   Symptoms none   Duration Continue with 45 min of aerobic exercise without signs/symptoms of physical distress.   Intensity THRR unchanged     Progression   Progression Continue to progress workloads to maintain intensity without signs/symptoms of physical distress.   Average METs 3.75     Resistance Training   Training Prescription Yes   Weight 4 lbs   Reps 10-15     Interval Training   Interval Training No     Treadmill   MPH 3   Grade 3   Minutes 15   METs 4.54     NuStep   Level 3   Minutes 15   METs 3.9     REL-XR   Level 2   Minutes 15   METs 2.8     Home Exercise Plan   Plans to continue exercise at Home (comment)  walking   Frequency Add 4 additional days to program exercise sessions.   Initial Home Exercises Provided 12/05/16      Nutrition:  Target Goals: Understanding of nutrition guidelines, daily intake of sodium 1500mg , cholesterol 200mg , calories 30% from fat and 7% or less from saturated fats, daily to have 5 or more servings of fruits and vegetables.  Biometrics:     Pre Biometrics - 11/14/16 1552      Pre Biometrics   Height 5' 5.8" (1.671 m)   Weight 186 lb 1.6 oz (84.4 kg)   Waist Circumference 41 inches   Hip Circumference 40.5 inches   Waist to Hip Ratio 1.01 %   BMI (Calculated) 30.23   Single Leg Stand 7.67 seconds       Nutrition Therapy Plan and Nutrition Goals:     Nutrition Therapy & Goals - 11/14/16  1320      Nutrition Therapy   RD appointment defered Yes  Does not want to meet with the RD.       Nutrition Discharge: Rate Your Plate Scores:     Nutrition Assessments - 11/28/16 0923      MEDFICTS Scores   Pre Score 46      Nutrition Goals Re-Evaluation:     Nutrition Goals Re-Evaluation    Row Name 12/05/16 0908             Goals   Current Weight 184 lb (83.5 kg)       Nutrition Goal Nutrition appt 10/16 with wife!!       Comment [name] has an appt scheduled to meet with dietician per request of his wife.  They are seeking more advice on ways to tweak diet and provide enough variety.       Expected Outcome Short: Meet with dietician.  Long: Stick with heart healthy diet.           Nutrition Goals Discharge (Final Nutrition Goals Re-Evaluation):     Nutrition Goals Re-Evaluation - 12/05/16 0908      Goals   Current Weight 184 lb (83.5 kg)   Nutrition Goal Nutrition appt 10/16 with wife!!   Comment [name] has an appt scheduled to meet with dietician per request of his wife.  They are seeking more advice on ways to tweak diet and provide enough variety.   Expected Outcome  Short: Meet with dietician.  Long: Stick with heart healthy diet.       Psychosocial: Target Goals: Acknowledge presence or absence of significant depression and/or stress, maximize coping skills, provide positive support system. Participant is able to verbalize types and ability to use techniques and skills needed for reducing stress and depression.   Initial Review & Psychosocial Screening:     Initial Psych Review & Screening - 11/14/16 1322      Initial Review   Current issues with None Identified     Family Dynamics   Good Support System? Yes  Wife     Barriers   Psychosocial barriers to participate in program There are no identifiable barriers or psychosocial needs.;The patient should benefit from training in stress management and relaxation.     Screening Interventions    Interventions Encouraged to exercise  Declined to complete the QOL questionnaire       Quality of Life Scores:      Quality of Life - 11/14/16 1323      Quality of Life Scores   Health/Function Pre --  Declined to complete this questionnaire      PHQ-9: Recent Review Flowsheet Data    Depression screen [facility] 2/9 11/14/2016   Decreased Interest 0   Down, Depressed, Hopeless 0   PHQ - 2 Score 0   Altered sleeping 0   Tired, decreased energy 0   Change in appetite 0   Feeling bad or failure about yourself  0   Trouble concentrating 0   Moving slowly or fidgety/restless 0   Suicidal thoughts 0   PHQ-9 Score 0   Difficult doing work/chores Not difficult at all     Interpretation of Total Score  Total Score Depression Severity:  1-4 = Minimal depression, 5-9 = Mild depression, 10-14 = Moderate depression, 15-19 = Moderately severe depression, 20-27 = Severe depression   Psychosocial Evaluation and Intervention:   Psychosocial Re-Evaluation:     Psychosocial Re-Evaluation    [place] Name 12/05/16 0910             Psychosocial Re-Evaluation   Current issues with Current Stress Concerns       Comments He has had bladder and prostate removed and that has been his biggest obstacle.  His heart attack has not been a big deal as a results.  He is very good at coping with his illnesses.  He is sleeping great; he get about 7-8 hours at night and woken up by his shoulder pain, but able to go back to sleep easily.  His wife is very supportive and has encouraged him to do rehab.        Expected Outcomes Short: Continue to attend rehab for physical benefit.  Long: Maintain postive attitude.,       Interventions Encouraged to attend Cardiac Rehabilitation for the exercise;Stress management education       Continue Psychosocial Services  Follow up required by staff         Initial Review   Source of Stress Concerns Chronic Illness          Psychosocial Discharge (Final Psychosocial  Re-Evaluation):     Psychosocial Re-Evaluation - 12/05/16 0910      Psychosocial Re-Evaluation   Current issues with Current Stress Concerns   Comments He has had bladder and prostate removed and that has been his biggest obstacle.  His heart attack has not been a big deal as a results.  He is very good at coping  with his illnesses.  He is sleeping great; he get about 7-8 hours at night and woken up by his shoulder pain, but able to go back to sleep easily.  His wife is very supportive and has encouraged him to do rehab.    Expected Outcomes Short: Continue to attend rehab for physical benefit.  Long: Maintain postive attitude.,   Interventions Encouraged to attend Cardiac Rehabilitation for the exercise;Stress management education   Continue Psychosocial Services  Follow up required by staff     Initial Review   Source of Stress Concerns Chronic Illness      Vocational Rehabilitation: Provide vocational rehab assistance to qualifying candidates.   Vocational Rehab Evaluation & Intervention:     Vocational Rehab - 11/14/16 1325      Initial Vocational Rehab Evaluation & Intervention   Assessment shows need for Vocational Rehabilitation No      Education: Education Goals: Education classes will be provided on a variety of topics geared toward better understanding of heart health and risk factor modification. Participant will state understanding/return demonstration of topics presented as noted by education test scores.  Learning Barriers/Preferences:     Learning Barriers/Preferences - 11/14/16 1324      Learning Barriers/Preferences   Learning Barriers None   Learning Preferences None      Education Topics: General Nutrition Guidelines/Fats and Fiber: -Group instruction provided by verbal, written material, models and posters to present the general guidelines for heart healthy nutrition. Gives an explanation and review of dietary fats and fiber.   Controlling  Sodium/Reading Food Labels: -Group verbal and written material supporting the discussion of sodium use in heart healthy nutrition. Review and explanation with models, verbal and written materials for utilization of the food label.   Exercise Physiology & Risk Factors: - Group verbal and written instruction with models to review the exercise physiology of the cardiovascular system and associated critical values. Details cardiovascular disease risk factors and the goals associated with each risk factor.   Aerobic Exercise & Resistance Training: - Gives group verbal and written discussion on the health impact of inactivity. On the components of aerobic and resistive training programs and the benefits of this training and how to safely progress through these programs.   Flexibility, Balance, General Exercise Guidelines: - Provides group verbal and written instruction on the benefits of flexibility and balance training programs. Provides general exercise guidelines with specific guidelines to those with heart or lung disease. Demonstration and skill practice provided.   Stress Management: - Provides group verbal and written instruction about the health risks of elevated stress, cause of high stress, and healthy ways to reduce stress.   Cardiac Rehab from 12/17/2016 in [facility] Cardiac and Pulmonary Rehab  Date  11/26/16  Educator  [facility]  Instruction Review Code  1- Verbalizes Understanding      Depression: - Provides group verbal and written instruction on the correlation between heart/lung disease and depressed mood, treatment options, and the stigmas associated with seeking treatment.   Cardiac Rehab from 12/17/2016 in [facility] Cardiac and Pulmonary Rehab  Date  12/17/16  Educator  [facility]  Instruction Review Code  1- Verbalizes Understanding      Anatomy & Physiology of the Heart: - Group verbal and written instruction and models provide basic cardiac anatomy and physiology, with the coronary  electrical and arterial systems. Review of: AMI, Angina, Valve disease, Heart Failure, Cardiac Arrhythmia, Pacemakers, and the ICD.   Cardiac Rehab from 12/17/2016 in [facility] Cardiac and Pulmonary Rehab  Date  11/28/16  Educator  [place]  Instruction Review Code  1- Verbalizes Understanding      Cardiac Procedures: - Group verbal and written instruction to review commonly prescribed medications for heart disease. Reviews the medication, class of the drug, and side effects. Includes the steps to properly store meds and maintain the prescription regimen. (beta blockers and nitrates)   Cardiac Rehab from 12/17/2016 in [facility] Cardiac and Pulmonary Rehab  Date  12/03/16  Educator  [facility]  Instruction Review Code  1- Verbalizes Understanding      Cardiac Medications I: - Group verbal and written instruction to review commonly prescribed medications for heart disease. Reviews the medication, class of the drug, and side effects. Includes the steps to properly store meds and maintain the prescription regimen.   Cardiac Medications II: -Group verbal and written instruction to review commonly prescribed medications for heart disease. Reviews the medication, class of the drug, and side effects. (all other drug classes)    Go Sex-Intimacy & Heart Disease, Get SMART - Goal Setting: - Group verbal and written instruction through game format to discuss heart disease and the return to sexual intimacy. Provides group verbal and written material to discuss and apply goal setting through the application of the S.M.A.R.T. Method.   Cardiac Rehab from 12/17/2016 in [facility] Cardiac and Pulmonary Rehab  Date  12/03/16  Educator  [facility]  Instruction Review Code  1- Verbalizes Understanding      Other Matters of the Heart: - Provides group verbal, written materials and models to describe Heart Failure, Angina, Valve Disease, Peripheral Artery Disease, and Diabetes in the realm of heart disease. Includes description of the disease  process and treatment options available to the cardiac patient.   Cardiac Rehab from 12/17/2016 in [facility] Cardiac and Pulmonary Rehab  Date  11/28/16  Educator  [facility]  Instruction Review Code  1- Verbalizes Understanding      Exercise & Equipment Safety: - Individual verbal instruction and demonstration of equipment use and safety with use of the equipment.   Cardiac Rehab from 12/17/2016 in [facility] Cardiac and Pulmonary Rehab  Date  11/14/16  Educator  [facility]  Instruction Review Code  1- Verbalizes Understanding      Infection Prevention: - Provides verbal and written material to individual with discussion of infection control including proper hand washing and proper equipment cleaning during exercise session.   Cardiac Rehab from 12/17/2016 in [facility] Cardiac and Pulmonary Rehab  Date  11/14/16  Educator  Sb  Instruction Review Code  1- Verbalizes Understanding      Falls Prevention: - Provides verbal and written material to individual with discussion of falls prevention and safety.   Cardiac Rehab from 12/17/2016 in [facility] Cardiac and Pulmonary Rehab  Date  11/14/16  Educator  SB  Instruction Review Code  1- Verbalizes Understanding      Diabetes: - Individual verbal and written instruction to review signs/symptoms of diabetes, desired ranges of glucose level fasting, after meals and with exercise. Acknowledge that pre and post exercise glucose checks will be done for 3 sessions at entry of program.   Cardiac Rehab from 12/17/2016 in [facility] Cardiac and Pulmonary Rehab  Date  11/14/16  Educator  Sb  Instruction Review Code  1- Verbalizes Understanding      Other: -Provides group and verbal instruction on various topics (see comments)    Knowledge Questionnaire Score:     Knowledge Questionnaire Score - 11/14/16 1324      Knowledge Questionnaire Score  Pre Score Declined to complete this questionnaire      Core Components/Risk Factors/Patient Goals at Admission:     Personal  Goals and Risk Factors at Admission - 11/14/16 1321      Core Components/Risk Factors/Patient Goals on Admission    Weight Management Yes;Weight Loss   Intervention Weight Management: Develop a combined nutrition and exercise program designed to reach desired caloric intake, while maintaining appropriate intake of nutrient and fiber, sodium and fats, and appropriate energy expenditure required for the weight goal.;Weight Management: Provide education and appropriate resources to help participant work on and attain dietary goals.;Obesity: Provide education and appropriate resources to help participant work on and attain dietary goals.   Admit Weight 186 lb 1.6 oz (84.4 kg)   Goal Weight: Short Term 184 lb (83.5 kg)   Goal Weight: Long Term 180 lb (81.6 kg)   Expected Outcomes Short Term: Continue to assess and modify interventions until short term weight is achieved;Long Term: Adherence to nutrition and physical activity/exercise program aimed toward attainment of established weight goal   Diabetes Yes   Intervention Provide education about signs/symptoms and action to take for hypo/hyperglycemia.;Provide education about proper nutrition, including hydration, and aerobic/resistive exercise prescription along with prescribed medications to achieve blood glucose in normal ranges: Fasting glucose 65-99 mg/dL   Expected Outcomes Short Term: Participant verbalizes understanding of the signs/symptoms and immediate care of hyper/hypoglycemia, proper foot care and importance of medication, aerobic/resistive exercise and nutrition plan for blood glucose control.;Long Term: Attainment of HbA1C < 7%.   Hypertension Yes   Intervention Provide education on lifestyle modifcations including regular physical activity/exercise, weight management, moderate sodium restriction and increased consumption of fresh fruit, vegetables, and low fat dairy, alcohol moderation, and smoking cessation.;Monitor prescription use  compliance.   Expected Outcomes Short Term: Continued assessment and intervention until BP is < 140/26mm HG in hypertensive participants. < 130/2mm HG in hypertensive participants with diabetes, heart failure or chronic kidney disease.;Long Term: Maintenance of blood pressure at goal levels.   Lipids Yes   Intervention Provide education and support for participant on nutrition & aerobic/resistive exercise along with prescribed medications to achieve LDL 70mg , HDL >40mg .   Expected Outcomes Short Term: Participant states understanding of desired cholesterol values and is compliant with medications prescribed. Participant is following exercise prescription and nutrition guidelines.;Long Term: Cholesterol controlled with medications as prescribed, with individualized exercise RX and with personalized nutrition plan. Value goals: LDL < 70mg , HDL > 40 mg.      Core Components/Risk Factors/Patient Goals Review:      Goals and Risk Factor Review    Row Name 12/05/16 0904             Core Components/Risk Factors/Patient Goals Review   Personal Goals Review Weight Management/Obesity;Hypertension;Lipids;Diabetes       Review [name] has been doing well in rehab.  His weight is down some today to 184 lbs.  He is not checking his blood sugars at home but not feel that he is having any swings.  Talked about the importance of keeping his levels stable.  He says that it does go up and down.  His blood pressures have been good in the program.  He does not check it at home, but says he goes to doctor enough to get it checked.  He is doing well on his medications and not having any problems.        Expected Outcomes Short: Continue to maintain weight.  Long: Continue to work on  risk factor modifications.           Core Components/Risk Factors/Patient Goals at Discharge (Final Review):      Goals and Risk Factor Review - 12/05/16 0904      Core Components/Risk Factors/Patient Goals Review   Personal Goals  Review Weight Management/Obesity;Hypertension;Lipids;Diabetes   Review [name] has been doing well in rehab.  His weight is down some today to 184 lbs.  He is not checking his blood sugars at home but not feel that he is having any swings.  Talked about the importance of keeping his levels stable.  He says that it does go up and down.  His blood pressures have been good in the program.  He does not check it at home, but says he goes to doctor enough to get it checked.  He is doing well on his medications and not having any problems.    Expected Outcomes Short: Continue to maintain weight.  Long: Continue to work on risk factor modifications.       ITP Comments:     ITP Comments    Row Name 11/14/16 1311 11/20/16 1124 12/18/16 0645       ITP Comments Medical review completed.  INitial ITP sent to Medical Director for review, changes if necessary and signature.   Diagnosis documentation can be found in CARE EVERYWHERE 10/17/2016 Office Visit 30 day review. Continue with ITP unless directed changes per Medical Director review.   30 day Review. Continue with ITP unless directed changes per Medical Director Review.         Comments:

## 2016-12-19 ENCOUNTER — Encounter: Payer: Commercial Managed Care - HMO | Admitting: *Deleted

## 2016-12-19 ENCOUNTER — Encounter: Payer: Self-pay | Admitting: *Deleted

## 2016-12-19 DIAGNOSIS — Z955 Presence of coronary angioplasty implant and graft: Secondary | ICD-10-CM

## 2016-12-19 DIAGNOSIS — I214 Non-ST elevation (NSTEMI) myocardial infarction: Secondary | ICD-10-CM

## 2016-12-19 NOTE — Progress Notes (Signed)
Daily Session Note  Patient Details  Name: Larry Mann MRN: [id number] Date of Birth: 08/16/1950 Referring Provider:     Cardiac Rehab from 11/14/2016 in [facility] Cardiac and Pulmonary Rehab  Referring Provider  Kowalski, Bruce MD      Encounter Date: 12/19/2016  Check In:     Session Check In - 12/19/16 0841      Check-In   Location [facility]-Cardiac & Pulmonary Rehab   Staff Present Jessica Hawkins, MA, ACSM RCEP, Exercise Physiologist;Amanda Sommer, BA, ACSM CEP, Exercise Physiologist;Meredith Craven, RN BSN   Supervising physician immediately available to respond to emergencies See telemetry face sheet for immediately available ER MD   Medication changes reported     No   Fall or balance concerns reported    No   Warm-up and Cool-down Performed on first and last piece of equipment   Resistance Training Performed Yes   VAD Patient? No     Pain Assessment   Currently in Pain? No/denies         History  Smoking Status  . Former Smoker  . Quit date: 05/19/2001  Smokeless Tobacco  . Never Used    Goals Met:  Independence with exercise equipment Exercise tolerated well No report of cardiac concerns or symptoms Strength training completed today  Goals Unmet:  Not Applicable  Comments: Pt able to follow exercise prescription today without complaint.  Will continue to monitor for progression.    Dr. Mark Miller is Medical Director for [facility] and [facility]. 

## 2016-12-24 ENCOUNTER — Encounter: Payer: Self-pay | Admitting: Dietician

## 2016-12-24 ENCOUNTER — Encounter: Payer: Commercial Managed Care - HMO | Admitting: *Deleted

## 2016-12-24 DIAGNOSIS — Z955 Presence of coronary angioplasty implant and graft: Secondary | ICD-10-CM

## 2016-12-24 DIAGNOSIS — I214 Non-ST elevation (NSTEMI) myocardial infarction: Secondary | ICD-10-CM | POA: Diagnosis not present

## 2016-12-24 NOTE — Progress Notes (Signed)
Daily Session Note  Patient Details  Name: LIGE LAKEMAN MRN: [id number] Date of Birth: [birth year] Referring Provider:     Cardiac Rehab from 11/14/2016 in [facility] Cardiac and Pulmonary Rehab  Referring Provider  Serafina Royals MD      Encounter Date: 12/24/2016  Check In:     Session Check In - 12/24/16 0847      Check-In   Location [facility]-Cardiac & Pulmonary Rehab   Staff Present Renita Papa, RN BSN;Joseph Hood RCP,RRT,BSRT;Diane [facility] RN,BSN   Supervising physician immediately available to respond to emergencies See telemetry face sheet for immediately available ER MD   Medication changes reported     No   Fall or balance concerns reported    No   Warm-up and Cool-down Performed on first and last piece of equipment   Resistance Training Performed Yes   VAD Patient? No     Pain Assessment   Currently in Pain? No/denies         History  Smoking Status  . Former Smoker  . Quit date: 05/19/2001  Smokeless Tobacco  . Never Used    Goals Met:  Independence with exercise equipment Exercise tolerated well No report of cardiac concerns or symptoms Strength training completed today  Goals Unmet:  Not Applicable  Comments: Pt able to follow exercise prescription today without complaint.  Will continue to monitor for progression.    Dr. Emily Filbert is Medical Director for [place] and [facility].

## 2016-12-26 ENCOUNTER — Encounter: Payer: Commercial Managed Care - HMO | Admitting: *Deleted

## 2016-12-26 DIAGNOSIS — I214 Non-ST elevation (NSTEMI) myocardial infarction: Secondary | ICD-10-CM

## 2016-12-26 DIAGNOSIS — Z955 Presence of coronary angioplasty implant and graft: Secondary | ICD-10-CM

## 2016-12-26 NOTE — Progress Notes (Signed)
Daily Session Note  Patient Details  Name: Larry Mann MRN: [id number] Date of Birth: 08-11-1950 Referring Provider:     Cardiac Rehab from 11/14/2016 in [facility] Cardiac and Pulmonary Rehab  Referring Provider  Serafina Royals MD      Encounter Date: 12/26/2016  Check In:     Session Check In - 12/26/16 0939      Check-In   Location [facility]-Cardiac & Pulmonary Rehab   Staff Present Nada Maclachlan, BA, ACSM CEP, Exercise Physiologist;Yentl Verge Frederico Hamman, RN BSN;Meredith Sherryll Burger, RN BSN   Supervising physician immediately available to respond to emergencies See telemetry face sheet for immediately available ER MD   Medication changes reported     No   Fall or balance concerns reported    No   Tobacco Cessation No Change   Warm-up and Cool-down Performed on first and last piece of equipment   Resistance Training Performed Yes   VAD Patient? No     Pain Assessment   Currently in Pain? No/denies   Multiple Pain Sites No           Exercise Prescription Changes - 12/25/16 1200      Response to Exercise   Blood Pressure (Admit) 110/68   Blood Pressure (Exercise) 110/62   Blood Pressure (Exit) 98/58   Heart Rate (Admit) 87 bpm   Heart Rate (Exercise) 116 bpm   Heart Rate (Exit) 83 bpm   Rating of Perceived Exertion (Exercise) 12   Symptoms none   Duration Continue with 45 min of aerobic exercise without signs/symptoms of physical distress.   Intensity THRR unchanged     Progression   Progression Continue to progress workloads to maintain intensity without signs/symptoms of physical distress.   Average METs 4.37     Resistance Training   Training Prescription Yes   Weight 4 lb   Reps 10-15     Interval Training   Interval Training No     Treadmill   MPH 3   Grade 3   Minutes 15   METs 4.54     NuStep   Level 3   Minutes 15   METs 4.2     Home Exercise Plan   Plans to continue exercise at Home (comment)  walking   Frequency Add 4 additional days to program  exercise sessions.   Initial Home Exercises Provided 12/05/16      History  Smoking Status  . Former Smoker  . Quit date: 05/19/2001  Smokeless Tobacco  . Never Used    Goals Met:  Independence with exercise equipment Exercise tolerated well No report of cardiac concerns or symptoms Strength training completed today  Goals Unmet:  Not Applicable  Comments: Pt able to follow exercise prescription today without complaint.  Will continue to monitor for progression.    Dr. Emily Filbert is Medical Director for [place] and [facility].

## 2016-12-31 DIAGNOSIS — I214 Non-ST elevation (NSTEMI) myocardial infarction: Secondary | ICD-10-CM

## 2016-12-31 DIAGNOSIS — Z955 Presence of coronary angioplasty implant and graft: Secondary | ICD-10-CM

## 2016-12-31 NOTE — Progress Notes (Signed)
Daily Session Note  Patient Details  Name: Larry Mann MRN: [id number] Date of Birth: 07/18/1950 Referring Provider:     Cardiac Rehab from 11/14/2016 in [facility] Cardiac and Pulmonary Rehab  Referring Provider  Serafina Royals MD      Encounter Date: 12/31/2016  Check In:     Session Check In - 12/31/16 0837      Check-In   Location [facility]-Cardiac & Pulmonary Rehab   Staff Present Heath Lark, RN, BSN, CCRP;Jessica Luan Pulling, MA, ACSM RCEP, Exercise Physiologist;Tasheika Kitzmiller Oletta Darter, BA, ACSM CEP, Exercise Physiologist   Supervising physician immediately available to respond to emergencies See telemetry face sheet for immediately available ER MD   Medication changes reported     No   Fall or balance concerns reported    No   Warm-up and Cool-down Performed on first and last piece of equipment   Resistance Training Performed Yes   VAD Patient? No     Pain Assessment   Currently in Pain? No/denies         History  Smoking Status  . Former Smoker  . Quit date: 05/19/2001  Smokeless Tobacco  . Never Used    Goals Met:  Independence with exercise equipment Exercise tolerated well No report of cardiac concerns or symptoms Strength training completed today  Goals Unmet:  Not Applicable  Comments: Pt able to follow exercise prescription today without complaint.  Will continue to monitor for progression.    Dr. Emily Filbert is Medical Director for [place] and [facility].

## 2017-01-02 ENCOUNTER — Encounter: Payer: Commercial Managed Care - HMO | Admitting: *Deleted

## 2017-01-02 DIAGNOSIS — Z955 Presence of coronary angioplasty implant and graft: Secondary | ICD-10-CM

## 2017-01-02 DIAGNOSIS — I214 Non-ST elevation (NSTEMI) myocardial infarction: Secondary | ICD-10-CM | POA: Diagnosis not present

## 2017-01-02 NOTE — Progress Notes (Signed)
Daily Session Note  Patient Details  Name: Larry Mann MRN: [id number] Date of Birth: 05-17-1950 Referring Provider:     Cardiac Rehab from 11/14/2016 in [facility] Cardiac and Pulmonary Rehab  Referring Provider  Serafina Royals MD      Encounter Date: 01/02/2017  Check In:     Session Check In - 01/02/17 0906      Check-In   Location [facility]-Cardiac & Pulmonary Rehab   Staff Present Alberteen Sam, MA, ACSM RCEP, Exercise Physiologist;Amanda Oletta Darter, BA, ACSM CEP, Exercise Physiologist;Meredith Sherryll Burger, RN BSN   Supervising physician immediately available to respond to emergencies See telemetry face sheet for immediately available ER MD   Medication changes reported     No   Fall or balance concerns reported    No   Warm-up and Cool-down Performed on first and last piece of equipment   Resistance Training Performed Yes   VAD Patient? No     Pain Assessment   Currently in Pain? No/denies         History  Smoking Status  . Former Smoker  . Quit date: 05/19/2001  Smokeless Tobacco  . Never Used    Goals Met:  Independence with exercise equipment Exercise tolerated well Personal goals reviewed No report of cardiac concerns or symptoms Strength training completed today  Goals Unmet:  Not Applicable  Comments: Pt able to follow exercise prescription today without complaint.  Will continue to monitor for progression.    Dr. Emily Filbert is Medical Director for [place] and [facility].

## 2017-01-07 DIAGNOSIS — I214 Non-ST elevation (NSTEMI) myocardial infarction: Secondary | ICD-10-CM

## 2017-01-07 DIAGNOSIS — Z955 Presence of coronary angioplasty implant and graft: Secondary | ICD-10-CM

## 2017-01-07 NOTE — Progress Notes (Signed)
Daily Session Note  Patient Details  Name: Larry Mann MRN: [id number] Date of Birth: [birth year] Referring Provider:     Cardiac Rehab from 11/14/2016 in [facility] Cardiac and Pulmonary Rehab  Referring Provider  Serafina Royals MD      Encounter Date: 01/07/2017  Check In:     Session Check In - 01/07/17 0950      Check-In   Location [facility]-Cardiac & Pulmonary Rehab   Staff Present Heath Lark, RN, BSN, CCRP;Jessica Luan Pulling, MA, ACSM RCEP, Exercise Physiologist;Teagen Mcleary Oletta Darter, BA, ACSM CEP, Exercise Physiologist   Supervising physician immediately available to respond to emergencies See telemetry face sheet for immediately available ER MD   Medication changes reported     No   Fall or balance concerns reported    No   Warm-up and Cool-down Performed on first and last piece of equipment   Resistance Training Performed Yes   VAD Patient? No     Pain Assessment   Currently in Pain? No/denies   Multiple Pain Sites No         History  Smoking Status  . Former Smoker  . Quit date: 05/19/2001  Smokeless Tobacco  . Never Used    Goals Met:  Independence with exercise equipment Exercise tolerated well No report of cardiac concerns or symptoms Strength training completed today  Goals Unmet:  Not Applicable  Comments: Pt able to follow exercise prescription today without complaint.  Will continue to monitor for progression.    Dr. Emily Filbert is Medical Director for [place] and [facility].

## 2017-01-09 ENCOUNTER — Encounter: Payer: 59 | Attending: Internal Medicine

## 2017-01-09 DIAGNOSIS — Z87891 Personal history of nicotine dependence: Secondary | ICD-10-CM | POA: Diagnosis not present

## 2017-01-09 DIAGNOSIS — Z79899 Other long term (current) drug therapy: Secondary | ICD-10-CM | POA: Diagnosis not present

## 2017-01-09 DIAGNOSIS — E119 Type 2 diabetes mellitus without complications: Secondary | ICD-10-CM | POA: Diagnosis not present

## 2017-01-09 DIAGNOSIS — Z955 Presence of coronary angioplasty implant and graft: Secondary | ICD-10-CM | POA: Diagnosis not present

## 2017-01-09 DIAGNOSIS — Z7984 Long term (current) use of oral hypoglycemic drugs: Secondary | ICD-10-CM | POA: Insufficient documentation

## 2017-01-09 DIAGNOSIS — I10 Essential (primary) hypertension: Secondary | ICD-10-CM | POA: Diagnosis not present

## 2017-01-09 DIAGNOSIS — I214 Non-ST elevation (NSTEMI) myocardial infarction: Secondary | ICD-10-CM

## 2017-01-09 NOTE — Progress Notes (Signed)
Daily Session Note  Patient Details  Name: Larry Mann MRN: [id number] Date of Birth: [birth year] Referring Provider:     Cardiac Rehab from 11/14/2016 in [facility] Cardiac and Pulmonary Rehab  Referring Provider  Serafina Royals MD      Encounter Date: 01/09/2017  Check In:     Session Check In - 01/09/17 0841      Check-In   Location [facility]-Cardiac & Pulmonary Rehab   Staff Present Alberteen Sam, MA, ACSM RCEP, Exercise Physiologist;Peaches Vanoverbeke Oletta Darter, BA, ACSM CEP, Exercise Physiologist;Meredith Sherryll Burger, RN BSN   Supervising physician immediately available to respond to emergencies See telemetry face sheet for immediately available ER MD   Medication changes reported     No   Fall or balance concerns reported    No   Warm-up and Cool-down Performed on first and last piece of equipment   Resistance Training Performed Yes   VAD Patient? No     Pain Assessment   Currently in Pain? No/denies   Multiple Pain Sites No         History  Smoking Status  . Former Smoker  . Quit date: 05/19/2001  Smokeless Tobacco  . Never Used    Goals Met:  Independence with exercise equipment Exercise tolerated well No report of cardiac concerns or symptoms Strength training completed today  Goals Unmet:  Not Applicable  Comments: Pt able to follow exercise prescription today without complaint.  Will continue to monitor for progression.    Dr. Emily Filbert is Medical Director for [place] and [facility].

## 2017-01-14 ENCOUNTER — Encounter: Payer: 59 | Admitting: *Deleted

## 2017-01-14 DIAGNOSIS — I214 Non-ST elevation (NSTEMI) myocardial infarction: Secondary | ICD-10-CM | POA: Diagnosis not present

## 2017-01-14 DIAGNOSIS — Z955 Presence of coronary angioplasty implant and graft: Secondary | ICD-10-CM

## 2017-01-14 NOTE — Progress Notes (Signed)
Daily Session Note  Patient Details  Name: Larry Mann MRN: [id number] Date of Birth: 11/28/1950 Referring Provider:     Cardiac Rehab from 11/14/2016 in [facility] Cardiac and Pulmonary Rehab  Referring Provider  Serafina Royals MD      Encounter Date: 01/14/2017  Check In: Session Check In - 01/14/17 0833      Check-In   Location  [facility]-Cardiac & Pulmonary Rehab    Staff Present  Nada Maclachlan, BA, ACSM CEP, Exercise Physiologist;Susanne Bice, RN, BSN, CCRP;Shandrell Boda Luan Pulling, MA, ACSM RCEP, Exercise Physiologist    Supervising physician immediately available to respond to emergencies  See telemetry face sheet for immediately available ER MD    Medication changes reported      No    Fall or balance concerns reported     No    Warm-up and Cool-down  Performed on first and last piece of equipment    Resistance Training Performed  Yes    VAD Patient?  No      Pain Assessment   Currently in Pain?  No/denies          Social History   Tobacco Use  Smoking Status Former Smoker  . Last attempt to quit: 05/19/2001  . Years since quitting: 15.6  Smokeless Tobacco Never Used    Goals Met:  Independence with exercise equipment Exercise tolerated well No report of cardiac concerns or symptoms Strength training completed today  Goals Unmet:  Not Applicable  Comments: Pt able to follow exercise prescription today without complaint.  Will continue to monitor for progression.    Dr. Emily Filbert is Medical Director for [place] and [facility].

## 2017-01-15 ENCOUNTER — Encounter: Payer: Self-pay | Admitting: *Deleted

## 2017-01-15 DIAGNOSIS — Z955 Presence of coronary angioplasty implant and graft: Secondary | ICD-10-CM

## 2017-01-15 DIAGNOSIS — I214 Non-ST elevation (NSTEMI) myocardial infarction: Secondary | ICD-10-CM

## 2017-01-15 NOTE — Progress Notes (Signed)
Cardiac Individual Treatment Plan  Patient Details  Name: Larry Mann MRN: [id number] Date of Birth: 1951-03-03 Referring Provider:     Cardiac Rehab from 11/14/2016 in [facility] Cardiac and Pulmonary Rehab  Referring Provider  Serafina Royals MD      Initial Encounter Date:    Cardiac Rehab from 11/14/2016 in [facility] Cardiac and Pulmonary Rehab  Date  11/14/16  Referring Provider  Serafina Royals MD      Visit Diagnosis: NSTEMI (non-ST elevated myocardial infarction) [facility])  Status post coronary artery stent placement  Patient's Home Medications on Admission:  Current Outpatient Medications:  .  amLODipine (NORVASC) 10 MG tablet, Take 10 mg by mouth daily., Disp: , Rfl:  .  aspirin EC 81 MG tablet, Take 81 mg by mouth daily., Disp: , Rfl:  .  atorvastatin (LIPITOR) 20 MG tablet, Take 20 mg by mouth daily., Disp: , Rfl:  .  glipiZIDE (GLUCOTROL XL) 5 MG 24 hr tablet, Take 5 mg by mouth daily with breakfast., Disp: , Rfl:  .  losartan (COZAAR) 100 MG tablet, Take by mouth., Disp: , Rfl:  .  metFORMIN (GLUCOPHAGE) 1000 MG tablet, Take 1,000 mg by mouth 2 (two) times daily with a meal., Disp: , Rfl:  .  metoprolol tartrate (LOPRESSOR) 25 MG tablet, Take 25 mg by mouth 2 (two) times daily., Disp: , Rfl:  .  potassium chloride (MICRO-K) 10 MEQ CR capsule, Take by mouth., Disp: , Rfl:  .  ticagrelor (BRILINTA) 90 MG TABS tablet, Take by mouth., Disp: , Rfl:   Past Medical History: Past Medical History:  Diagnosis Date  . Anginal pain ([place])   . Basal cell carcinoma   . Cancer ([place])   . Chicken pox   . Chronic kidney disease   . Coronary artery disease   . Diabetes mellitus without complication ([place])   . Headache   . Hypertension   . Myocardial infarction ([place])   . Plantar fasciitis     Tobacco Use: Social History   Tobacco Use  Smoking Status Former Smoker  . Last attempt to quit: 05/19/2001  . Years since quitting: 15.6  Smokeless Tobacco Never Used    Labs: Recent  Review Flowsheet Data    There is no flowsheet data to display.       Exercise Target Goals:    Exercise Program Goal: Individual exercise prescription set with THRR, safety & activity barriers. Participant demonstrates ability to understand and report RPE using BORG scale, to self-measure pulse accurately, and to acknowledge the importance of the exercise prescription.  Exercise Prescription Goal: Starting with aerobic activity 30 plus minutes a day, 3 days per week for initial exercise prescription. Provide home exercise prescription and guidelines that participant acknowledges understanding prior to discharge.  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 HR  84 bpm     Max Ex. BP  146/64     2 Minute Post BP  128/64        Oxygen Initial Assessment:   Oxygen Re-Evaluation:   Oxygen Discharge (Final Oxygen Re-Evaluation):   Initial Exercise Prescription: Initial Exercise Prescription - 11/14/16 1500      Date of Initial Exercise RX and Referring Provider   Date  11/14/16    Referring Provider  Serafina Royals MD      Treadmill   MPH  2.9    Grade  2    Minutes  15    METs  4.02      NuStep   Level  3    SPM  80    Minutes  15    METs   3      REL-XR   Level  2    Speed  50    Minutes  15    METs  3      Prescription Details   Frequency (times per week)  2    Duration  Progress to 45 minutes of aerobic exercise without signs/symptoms of physical distress      Intensity   THRR 40-80% of Max Heartrate  113-142    Ratings of Perceived Exertion  11-13    Perceived Dyspnea  0-4      Progression   Progression  Continue to progress workloads to maintain intensity without signs/symptoms of physical distress.      Resistance Training   Training Prescription  Yes    Weight  3 lbs    Reps  10-15       Perform Capillary Blood Glucose checks as needed.  Exercise Prescription Changes: Exercise Prescription Changes    Row Name 11/14/16 1500 11/26/16 1400 12/05/16 0900 12/13/16 1000 12/25/16 1200     Response to Exercise   Blood Pressure (Admit)  116/64  138/74  -  120/70  110/68   Blood Pressure (Exercise)  146/64  152/64  -  140/64  110/62   Blood Pressure (Exit)  128/64  134/60  -  124/60  98/58   Heart Rate (Admit)  84 bpm  99 bpm  -  84 bpm  87 bpm   Heart Rate (Exercise)  125 bpm  112 bpm  -  113 bpm  116 bpm   Heart Rate (Exit)  99 bpm  84 bpm  -  84 bpm  83 bpm   Oxygen Saturation (Admit)  97 %  -  -  -  -   Oxygen Saturation (Exercise)  98 %  -  -  -  -   Rating of Perceived Exertion (Exercise)  11  12  -  12  12   Symptoms  none  none  -  none  none   Comments  walk test results  first full day of exercise  -  -  -   Duration  -  Progress to 45 minutes of aerobic exercise without signs/symptoms  of physical distress  -  Continue with 45 min of aerobic exercise without signs/symptoms of physical distress.  Continue with 45 min of aerobic exercise without signs/symptoms of physical distress.   Intensity  -  THRR unchanged  -  THRR unchanged  THRR unchanged     Progression   Progression  -  Continue to progress workloads to maintain intensity without signs/symptoms of physical distress.  -  Continue to  progress workloads to maintain intensity without signs/symptoms of physical distress.  Continue to progress workloads to maintain intensity without signs/symptoms of physical distress.   Average METs  -  2.7  -  3.75  4.37     Resistance Training   Training Prescription  -  Yes  -  Yes  Yes   Weight  -  4 lbs  -  4 lbs  4 lb   Reps  -  10-15  -  10-15  10-15     Interval Training   Interval Training  -  No  -  No  No     Treadmill   MPH  -  -  -  3  3   Grade  -  -  -  3  3   Minutes  -  -  -  15  15   METs  -  -  -  4.54  4.54     NuStep   Level  -  3  -  3  3   Minutes  -  15  -  15  15   METs  -  2.6  -  3.9  4.2     REL-XR   Level  -  2  -  2  -   Minutes  -  15  -  15  -   METs  -  2.8  -  2.8  -     Home Exercise Plan   Plans to continue exercise at  -  -  Home (comment) walking  Home (comment) walking  Home (comment) walking   Frequency  -  -  Add 4 additional days to program exercise sessions.  Add 4 additional days to program exercise sessions.  Add 4 additional days to program exercise sessions.   Initial Home Exercises Provided  -  -  12/05/16  12/05/16  12/05/16   Row Name 01/09/17 1400             Response to Exercise   Blood Pressure (Admit)  120/72       Blood Pressure (Exercise)  116/58       Blood Pressure (Exit)  120/68       Heart Rate (Admit)  97 bpm       Heart Rate (Exercise)  118 bpm       Heart Rate (Exit)  91 bpm       Rating of Perceived Exertion (Exercise)  12       Symptoms  none       Duration  Continue with 45 min of aerobic exercise without signs/symptoms of physical distress.       Intensity  THRR unchanged         Progression   Progression  Continue to progress workloads to maintain intensity without signs/symptoms of physical distress.       Average METs  4.31         Resistance Training   Training Prescription  Yes  Weight  4 lb       Reps  10-15         Interval Training   Interval Training  No         Treadmill    MPH  3       Grade  3       Minutes  15       METs  4.54         NuStep   Level  3       Minutes  15       METs  4.1         REL-XR   Level  2       Minutes  15       METs  4.3         Home Exercise Plan   Plans to continue exercise at  Home (comment) walking       Frequency  Add 4 additional days to program exercise sessions.       Initial Home Exercises Provided  12/05/16          Exercise Comments: Exercise Comments    Row Name 11/26/16 1791           Exercise Comments  First full day of exercise!  Patient was oriented to gym and equipment including functions, settings, policies, and procedures.  Patient's individual exercise prescription and treatment plan were reviewed.  All starting workloads were established based on the results of the 6 minute walk test done at initial orientation visit.  The plan for exercise progression was also introduced and progression will be customized based on patient's performance and goals.          Exercise Goals and Review: Exercise Goals    Row Name 11/14/16 1551             Exercise Goals   Increase Physical Activity  Yes       Intervention  Provide advice, education, support and counseling about physical activity/exercise needs.;Develop an individualized exercise prescription for aerobic and resistive training based on initial evaluation findings, risk stratification, comorbidities and participant's personal goals.       Expected Outcomes  Achievement of increased cardiorespiratory fitness and enhanced flexibility, muscular endurance and strength shown through measurements of functional capacity and personal statement of participant.       Increase Strength and Stamina  Yes       Intervention  Provide advice, education, support and counseling about physical activity/exercise needs.;Develop an individualized exercise prescription for aerobic and resistive training based on initial evaluation findings, risk stratification,  comorbidities and participant's personal goals.       Expected Outcomes  Achievement of increased cardiorespiratory fitness and enhanced flexibility, muscular endurance and strength shown through measurements of functional capacity and personal statement of participant.       Able to understand and use rate of perceived exertion (RPE) scale  Yes       Intervention  Provide education and explanation on how to use RPE scale       Expected Outcomes  Short Term: Able to use RPE daily in rehab to express subjective intensity level;Long Term:  Able to use RPE to guide intensity level when exercising independently       Able to understand and use Dyspnea scale  Yes       Intervention  Provide education and explanation on how to use Dyspnea scale       Expected Outcomes  Short Term: Able to use Dyspnea scale daily in rehab to express subjective sense of shortness of breath during exertion;Long Term: Able to use Dyspnea scale to guide intensity level when exercising independently       Knowledge and understanding of Target Heart Rate Range (THRR)  Yes       Intervention  Provide education and explanation of THRR including how the numbers were predicted and where they are located for reference       Expected Outcomes  Short Term: Able to state/look up THRR;Long Term: Able to use THRR to govern intensity when exercising independently;Short Term: Able to use daily as guideline for intensity in rehab       Able to check pulse independently  Yes       Intervention  Provide education and demonstration on how to check pulse in carotid and radial arteries.;Review the importance of being able to check your own pulse for safety during independent exercise       Expected Outcomes  Short Term: Able to explain why pulse checking is important during independent exercise;Long Term: Able to check pulse independently and accurately       Understanding of Exercise Prescription  Yes       Intervention  Provide education,  explanation, and written materials on patient's individual exercise prescription       Expected Outcomes  Short Term: Able to explain program exercise prescription;Long Term: Able to explain home exercise prescription to exercise independently          Exercise Goals Re-Evaluation : Exercise Goals Re-Evaluation    Row Name 11/26/16 0929 12/05/16 0902 12/13/16 1042 12/25/16 1214 01/02/17 0907     Exercise Goal Re-Evaluation   Exercise Goals Review  Able to understand and use rate of perceived exertion (RPE) scale;Knowledge and understanding of Target Heart Rate Range (THRR);Understanding of Exercise Prescription  Increase Physical Activity;Increase Strength and Stamina;Able to understand and use rate of perceived exertion (RPE) scale;Knowledge and understanding of Target Heart Rate Range (THRR);Able to check pulse independently;Understanding of Exercise Prescription  Increase Physical Activity;Increase Strength and Stamina  Increase Physical Activity;Increase Strength and Stamina  Increase Physical Activity;Increase Strength and Stamina;Understanding of Exercise Prescription   Comments  Reviewed RPE scale, THR and program prescription with pt today.  Pt voiced understanding and was given a copy of goals to take home.   Larry Mann is off to a good start in rehab.  He has been walking some at home.  Reviewed home exercise with pt today.  Pt plans to continue walking at home for exercise.  Reviewed THR, pulse, RPE, sign and symptoms, NTG use, and when to call 911 or MD.  Also discussed weather considerations and indoor options.  Pt voiced understanding. He feels that his strength and stamina are getting better.   Larry Mann continues to do well in rehab.  He is now up to 3.0 mph with 3% grade on the treadmill.  He is making progress even though he did not really want to come to rehab.  We will continue to monitor his progress.  Larry Mann continues to do well in rehab.  Staff will continue to monitor progress.  Larry Mann continues  to do well in rehab.  He is walking at home on his off days.  He was out using his chainsaw earlier this week.  He is now doing level 4 on the NuStep,  We will continue to monitor his progression.    Expected Outcomes  Short: Use RPE daily to regulate  intensity.  Long: Follow program prescription in THR.  Short: Continue to walk at home on his off days and monitor his heart rate.  Long: Continue to work on Office manager.   Short: Talk about adding in intervals.  Long: Continue to exercise on off days at home.   Short - Larry Mann will add intervals while at rehab.  Long - Larry Mann will continue to exercise on his own.  Short: Talk about adding in intervals.  Long: Continue to exercise independently.      Discharge Exercise Prescription (Final Exercise Prescription Changes): Exercise Prescription Changes - 01/09/17 1400      Response to Exercise   Blood Pressure (Admit)  120/72    Blood Pressure (Exercise)  116/58    Blood Pressure (Exit)  120/68    Heart Rate (Admit)  97 bpm    Heart Rate (Exercise)  118 bpm    Heart Rate (Exit)  91 bpm    Rating of Perceived Exertion (Exercise)  12    Symptoms  none    Duration  Continue with 45 min of aerobic exercise without signs/symptoms of physical distress.    Intensity  THRR unchanged      Progression   Progression  Continue to progress workloads to maintain intensity without signs/symptoms of physical distress.    Average METs  4.31      Resistance Training   Training Prescription  Yes    Weight  4 lb    Reps  10-15      Interval Training   Interval Training  No      Treadmill   MPH  3    Grade  3    Minutes  15    METs  4.54      NuStep   Level  3    Minutes  15    METs  4.1      REL-XR   Level  2    Minutes  15    METs  4.3      Home Exercise Plan   Plans to continue exercise at  Home (comment) walking   walking   Frequency  Add 4 additional days to program exercise sessions.    Initial Home Exercises Provided   12/05/16       Nutrition:  Target Goals: Understanding of nutrition guidelines, daily intake of sodium <1545m, cholesterol <2069m calories 30% from fat and 7% or less from saturated fats, daily to have 5 or more servings of fruits and vegetables.  Biometrics: Pre Biometrics - 11/14/16 1552      Pre Biometrics   Height  5' 5.8" (1.671 m)    Weight  186 lb 1.6 oz (84.4 kg)    Waist Circumference  41 inches    Hip Circumference  40.5 inches    Waist to Hip Ratio  1.01 %    BMI (Calculated)  30.23    Single Leg Stand  7.67 seconds        Nutrition Therapy Plan and Nutrition Goals: Nutrition Therapy & Goals - 12/24/16 1106      Nutrition Therapy   Diet  basic      Personal Nutrition Goals   Personal Goal #2  make small positive diet changes as able and ready.     Comments  Mr. JaAschtates he will not be making many, if any, diet changes. Encouraged small changes such as incorporating additional vegetables gradually. Discussed meal and menu options, saturated fat sources, portion  control of carbs for blood sugar control.      Intervention Plan   Intervention  Prescribe, educate and counsel regarding individualized specific dietary modifications aiming towards targeted core components such as weight, hypertension, lipid management, diabetes, heart failure and other comorbidities.;Nutrition handout(s) given to patient.    Expected Outcomes  Short Term Goal: Understand basic principles of dietary content, such as calories, fat, sodium, cholesterol and nutrients.;Short Term Goal: A plan has been developed with personal nutrition goals set during dietitian appointment.;Long Term Goal: Adherence to prescribed nutrition plan.       Nutrition Discharge: Rate Your Plate Scores: Nutrition Assessments - 11/28/16 0923      MEDFICTS Scores   Pre Score  46       Nutrition Goals Re-Evaluation: Nutrition Goals Re-Evaluation    [place] Name 12/05/16 0908 01/02/17 0913           Goals    Current Weight  184 lb (83.5 kg)  -      Nutrition Goal  Nutrition appt 10/16 with wife!!  Make small tweeks in diet.  Focus on sugar and salt.       Comment  Larry Mann has an appt scheduled to meet with dietician per request of his wife.  They are seeking more advice on ways to tweak diet and provide enough variety.  Larry Mann and his wife met with dietician and only needed to make small adjustments.  They are well on their way.  She was happy to hear they were doing the right things.       Expected Outcome  Short: Meet with dietician.  Long: Stick with heart healthy diet.   Short: make minor tweeks in salt by reading labels.  Long: Stick with heart healthy diet         Nutrition Goals Discharge (Final Nutrition Goals Re-Evaluation): Nutrition Goals Re-Evaluation - 01/02/17 0913      Goals   Nutrition Goal  Make small tweeks in diet.  Focus on sugar and salt.     Comment  Larry Mann and his wife met with dietician and only needed to make small adjustments.  They are well on their way.  She was happy to hear they were doing the right things.     Expected Outcome  Short: make minor tweeks in salt by reading labels.  Long: Stick with heart healthy diet       Psychosocial: Target Goals: Acknowledge presence or absence of significant depression and/or stress, maximize coping skills, provide positive support system. Participant is able to verbalize types and ability to use techniques and skills needed for reducing stress and depression.   Initial Review & Psychosocial Screening: Initial Psych Review & Screening - 11/14/16 1322      Initial Review   Current issues with  None Identified      Family Dynamics   Good Support System?  Yes Wife   Wife     Barriers   Psychosocial barriers to participate in program  There are no identifiable barriers or psychosocial needs.;The patient should benefit from training in stress management and relaxation.      Screening Interventions   Interventions  Encouraged to  exercise Declined to complete the QOL questionnaire    Declined to complete the QOL questionnaire       Quality of Life Scores:  Quality of Life - 11/14/16 1323      Quality of Life Scores   Health/Function Pre  -- Declined to complete this questionnaire   Declined  to complete this questionnaire      PHQ-9: Recent Review Flowsheet Data    Depression screen [facility] 2/9 11/14/2016   Decreased Interest 0   Down, Depressed, Hopeless 0   PHQ - 2 Score 0   Altered sleeping 0   Tired, decreased energy 0   Change in appetite 0   Feeling bad or failure about yourself  0   Trouble concentrating 0   Moving slowly or fidgety/restless 0   Suicidal thoughts 0   PHQ-9 Score 0   Difficult doing work/chores Not difficult at all     Interpretation of Total Score  Total Score Depression Severity:  1-4 = Minimal depression, 5-9 = Mild depression, 10-14 = Moderate depression, 15-19 = Moderately severe depression, 20-27 = Severe depression   Psychosocial Evaluation and Intervention:   Psychosocial Re-Evaluation: Psychosocial Re-Evaluation    [place] Name 12/05/16 0910 01/02/17 0915           Psychosocial Re-Evaluation   Current issues with  Current Stress Concerns  Current Stress Concerns      Comments  He has had bladder and prostate removed and that has been his biggest obstacle.  His heart attack has not been a big deal as a results.  He is very good at coping with his illnesses.  He is sleeping great; he get about 7-8 hours at night and woken up by his shoulder pain, but able to go back to sleep easily.  His wife is very supportive and has encouraged him to do rehab.   Larry Mann has been doing well.  His wife cleared him from rehab and he continues to come as he likes the routine and company of his classamates.  He continues to cope with everyhting well and remains postive.  He has a great support system and continues to sleep well.      Expected Outcomes  Short: Continue to attend rehab for physical  benefit.  Long: Maintain postive attitude.,  Short: Continue to attend rehab for physical benefit.  Long: Maintain postive attitude.,      Interventions  Encouraged to attend Cardiac Rehabilitation for the exercise;Stress management education  Encouraged to attend Cardiac Rehabilitation for the exercise;Stress management education      Continue Psychosocial Services   Follow up required by staff  Follow up required by staff        Initial Review   Source of Stress Concerns  Chronic Illness  Chronic Illness         Psychosocial Discharge (Final Psychosocial Re-Evaluation): Psychosocial Re-Evaluation - 01/02/17 0915      Psychosocial Re-Evaluation   Current issues with  Current Stress Concerns    Comments  Larry Mann has been doing well.  His wife cleared him from rehab and he continues to come as he likes the routine and company of his classamates.  He continues to cope with everyhting well and remains postive.  He has a great support system and continues to sleep well.    Expected Outcomes  Short: Continue to attend rehab for physical benefit.  Long: Maintain postive attitude.,    Interventions  Encouraged to attend Cardiac Rehabilitation for the exercise;Stress management education    Continue Psychosocial Services   Follow up required by staff      Initial Review   Source of Stress Concerns  Chronic Illness       Vocational Rehabilitation: Provide vocational rehab assistance to qualifying candidates.   Vocational Rehab Evaluation & Intervention: Vocational Rehab - 11/14/16 1325  Initial Vocational Rehab Evaluation & Intervention   Assessment shows need for Vocational Rehabilitation  No       Education: Education Goals: Education classes will be provided on a variety of topics geared toward better understanding of heart health and risk factor modification. Participant will state understanding/return demonstration of topics presented as noted by education test scores.  Learning  Barriers/Preferences: Learning Barriers/Preferences - 11/14/16 1324      Learning Barriers/Preferences   Learning Barriers  None    Learning Preferences  None       Education Topics: General Nutrition Guidelines/Fats and Fiber: -Group instruction provided by verbal, written material, models and posters to present the general guidelines for heart healthy nutrition. Gives an explanation and review of dietary fats and fiber.   Cardiac Rehab from 01/14/2017 in [facility] Cardiac and Pulmonary Rehab  Date  12/24/16  Educator  PI  Instruction Review Code  1- Verbalizes Understanding      Controlling Sodium/Reading Food Labels: -Group verbal and written material supporting the discussion of sodium use in heart healthy nutrition. Review and explanation with models, verbal and written materials for utilization of the food label.   Cardiac Rehab from 01/14/2017 in [facility] Cardiac and Pulmonary Rehab  Date  12/31/16  Educator  CR  Instruction Review Code  1- Verbalizes Understanding      Exercise Physiology & Risk Factors: - Group verbal and written instruction with models to review the exercise physiology of the cardiovascular system and associated critical values. Details cardiovascular disease risk factors and the goals associated with each risk factor.   Cardiac Rehab from 01/14/2017 in [facility] Cardiac and Pulmonary Rehab  Date  01/07/17  Educator  [facility]  Instruction Review Code  1- Verbalizes Understanding      Aerobic Exercise & Resistance Training: - Gives group verbal and written discussion on the health impact of inactivity. On the components of aerobic and resistive training programs and the benefits of this training and how to safely progress through these programs.   Cardiac Rehab from 01/14/2017 in [facility] Cardiac and Pulmonary Rehab  Date  01/09/17  Educator  [facility]  Instruction Review Code  1- Verbalizes Understanding      Flexibility, Balance, General Exercise Guidelines: - Provides group  verbal and written instruction on the benefits of flexibility and balance training programs. Provides general exercise guidelines with specific guidelines to those with heart or lung disease. Demonstration and skill practice provided.   Cardiac Rehab from 01/14/2017 in [facility] Cardiac and Pulmonary Rehab  Date  01/14/17  Educator  AS  Instruction Review Code  1- Verbalizes Understanding      Stress Management: - Provides group verbal and written instruction about the health risks of elevated stress, cause of high stress, and healthy ways to reduce stress.   Cardiac Rehab from 01/14/2017 in [facility] Cardiac and Pulmonary Rehab  Date  11/26/16  Educator  [facility]  Instruction Review Code  1- Verbalizes Understanding      Depression: - Provides group verbal and written instruction on the correlation between heart/lung disease and depressed mood, treatment options, and the stigmas associated with seeking treatment.   Cardiac Rehab from 01/14/2017 in [facility] Cardiac and Pulmonary Rehab  Date  12/17/16  Educator  [facility]  Instruction Review Code  1- Verbalizes Understanding      Anatomy & Physiology of the Heart: - Group verbal and written instruction and models provide basic cardiac anatomy and physiology, with the coronary electrical and arterial systems. Review of: AMI, Angina,  Valve disease, Heart Failure, Cardiac Arrhythmia, Pacemakers, and the ICD.   Cardiac Rehab from 01/14/2017 in [facility] Cardiac and Pulmonary Rehab  Date  11/28/16  Educator  [facility]  Instruction Review Code  1- Verbalizes Understanding      Cardiac Procedures: - Group verbal and written instruction to review commonly prescribed medications for heart disease. Reviews the medication, class of the drug, and side effects. Includes the steps to properly store meds and maintain the prescription regimen. (beta blockers and nitrates)   Cardiac Rehab from 01/14/2017 in [facility] Cardiac and Pulmonary Rehab  Date  12/03/16  Educator  SB  Instruction  Review Code  1- Verbalizes Understanding      Cardiac Medications I: - Group verbal and written instruction to review commonly prescribed medications for heart disease. Reviews the medication, class of the drug, and side effects. Includes the steps to properly store meds and maintain the prescription regimen.   Cardiac Medications II: -Group verbal and written instruction to review commonly prescribed medications for heart disease. Reviews the medication, class of the drug, and side effects. (all other drug classes)    Go Sex-Intimacy & Heart Disease, Get SMART - Goal Setting: - Group verbal and written instruction through game format to discuss heart disease and the return to sexual intimacy. Provides group verbal and written material to discuss and apply goal setting through the application of the S.M.A.R.T. Method.   Cardiac Rehab from 01/14/2017 in [facility] Cardiac and Pulmonary Rehab  Date  12/03/16  Educator  SB  Instruction Review Code  1- Verbalizes Understanding      Other Matters of the Heart: - Provides group verbal, written materials and models to describe Heart Failure, Angina, Valve Disease, Peripheral Artery Disease, and Diabetes in the realm of heart disease. Includes description of the disease process and treatment options available to the cardiac patient.   Cardiac Rehab from 01/14/2017 in [place] Cardiac and Pulmonary Rehab  Date  11/28/16  Educator  [facility]  Instruction Review Code  1- Verbalizes Understanding      Exercise & Equipment Safety: - Individual verbal instruction and demonstration of equipment use and safety with use of the equipment.   Cardiac Rehab from 01/14/2017 in [facility] Cardiac and Pulmonary Rehab  Date  11/14/16  Educator  Sb  Instruction Review Code  1- Verbalizes Understanding      Infection Prevention: - Provides verbal and written material to individual with discussion of infection control including proper hand washing and proper equipment cleaning  during exercise session.   Cardiac Rehab from 01/14/2017 in [facility] Cardiac and Pulmonary Rehab  Date  11/14/16  Educator  Sb  Instruction Review Code  1- Verbalizes Understanding      Falls Prevention: - Provides verbal and written material to individual with discussion of falls prevention and safety.   Cardiac Rehab from 01/14/2017 in [facility] Cardiac and Pulmonary Rehab  Date  11/14/16  Educator  SB  Instruction Review Code  1- Verbalizes Understanding      Diabetes: - Individual verbal and written instruction to review signs/symptoms of diabetes, desired ranges of glucose level fasting, after meals and with exercise. Acknowledge that pre and post exercise glucose checks will be done for 3 sessions at entry of program.   Cardiac Rehab from 01/14/2017 in [facility] Cardiac and Pulmonary Rehab  Date  11/14/16  Educator  Sb  Instruction Review Code  1- Verbalizes Understanding      Other: -Provides group and verbal instruction on various topics (see comments)  Knowledge Questionnaire Score: Knowledge Questionnaire Score - 11/14/16 1324      Knowledge Questionnaire Score   Pre Score  Declined to complete this questionnaire       Core Components/Risk Factors/Patient Goals at Admission: Personal Goals and Risk Factors at Admission - 11/14/16 1321      Core Components/Risk Factors/Patient Goals on Admission    Weight Management  Yes;Weight Loss    Intervention  Weight Management: Develop a combined nutrition and exercise program designed to reach desired caloric intake, while maintaining appropriate intake of nutrient and fiber, sodium and fats, and appropriate energy expenditure required for the weight goal.;Weight Management: Provide education and appropriate resources to help participant work on and attain dietary goals.;Obesity: Provide education and appropriate resources to help participant work on and attain dietary goals.    Admit Weight  186 lb 1.6 oz (84.4 kg)    Goal Weight:  Short Term  184 lb (83.5 kg)    Goal Weight: Long Term  180 lb (81.6 kg)    Expected Outcomes  Short Term: Continue to assess and modify interventions until short term weight is achieved;Long Term: Adherence to nutrition and physical activity/exercise program aimed toward attainment of established weight goal    Diabetes  Yes    Intervention  Provide education about signs/symptoms and action to take for hypo/hyperglycemia.;Provide education about proper nutrition, including hydration, and aerobic/resistive exercise prescription along with prescribed medications to achieve blood glucose in normal ranges: Fasting glucose 65-99 mg/dL    Expected Outcomes  Short Term: Participant verbalizes understanding of the signs/symptoms and immediate care of hyper/hypoglycemia, proper foot care and importance of medication, aerobic/resistive exercise and nutrition plan for blood glucose control.;Long Term: Attainment of HbA1C < 7%.    Hypertension  Yes    Intervention  Provide education on lifestyle modifcations including regular physical activity/exercise, weight management, moderate sodium restriction and increased consumption of fresh fruit, vegetables, and low fat dairy, alcohol moderation, and smoking cessation.;Monitor prescription use compliance.    Expected Outcomes  Short Term: Continued assessment and intervention until BP is < 140/39m HG in hypertensive participants. < 130/875mHG in hypertensive participants with diabetes, heart failure or chronic kidney disease.;Long Term: Maintenance of blood pressure at goal levels.    Lipids  Yes    Intervention  Provide education and support for participant on nutrition & aerobic/resistive exercise along with prescribed medications to achieve LDL <7017mHDL >56m53m  Expected Outcomes  Short Term: Participant states understanding of desired cholesterol values and is compliant with medications prescribed. Participant is following exercise prescription and nutrition  guidelines.;Long Term: Cholesterol controlled with medications as prescribed, with individualized exercise RX and with personalized nutrition plan. Value goals: LDL < 70mg45mL > 40 mg.       Core Components/Risk Factors/Patient Goals Review:  Goals and Risk Factor Review    Row Name 12/05/16 0904 01/02/17 0910           Core Components/Risk Factors/Patient Goals Review   Personal Goals Review  Weight Management/Obesity;Hypertension;Lipids;Diabetes  Weight Management/Obesity;Hypertension;Lipids;Diabetes      Review  [name] Larry Camarabeen doing well in rehab.  His weight is down some today to 184 lbs.  He is not checking his blood sugars at home but not feel that he is having any swings.  Talked about the importance of keeping his levels stable.  He says that it does go up and down.  His blood pressures have been good in the program.  He does not check  it at home, but says he goes to doctor enough to get it checked.  He is doing well on his medications and not having any problems.   Larry Mann continues to be steady in his weight around 184lbs.   He has not noticed any swings with his blood sugars.  His meds have been working.  He continues to do well with his blood pressures.        Expected Outcomes  Short: Continue to maintain weight.  Long: Continue to work on risk factor modifications.   Short: Continue to maintain weight and blood pressures.  Long: Continue to monitor risk factor modificaitons.          Core Components/Risk Factors/Patient Goals at Discharge (Final Review):  Goals and Risk Factor Review - 01/02/17 0910      Core Components/Risk Factors/Patient Goals Review   Personal Goals Review  Weight Management/Obesity;Hypertension;Lipids;Diabetes    Review  Larry Mann continues to be steady in his weight around 184lbs.   He has not noticed any swings with his blood sugars.  His meds have been working.  He continues to do well with his blood pressures.      Expected Outcomes  Short: Continue to maintain  weight and blood pressures.  Long: Continue to monitor risk factor modificaitons.        ITP Comments: ITP Comments    Row Name 11/14/16 1311 11/20/16 1124 12/18/16 0645 12/31/16 1232 01/03/17 1028   ITP Comments  Medical review completed.  INitial ITP sent to Medical Director for review, changes if necessary and signature.   Diagnosis documentation can be found in CARE EVERYWHERE 10/17/2016 Office Visit  30 day review. Continue with ITP unless directed changes per Medical Director review.    30 day Review. Continue with ITP unless directed changes per Medical Director Review.   EKG disclosure from today's session sent to Dr Nehemiah Massed for review.  Multiple PVC's recoreded,more than usually seen in other sessions. FYI to Dr Nehemiah Massed.  No symptoms repoerted and Larry Mann did not "feel any of the irregular beats".   Note returned from Dr Nehemiah Massed.  No changes made and Dr Nehemiah Massed will review concnern at next office visit with Larry Mann.    [place] Name 01/15/17 0648           ITP Comments  30 day review. Continue with ITP unless directed changes per Medical Director review.           Comments:

## 2017-01-16 DIAGNOSIS — I214 Non-ST elevation (NSTEMI) myocardial infarction: Secondary | ICD-10-CM

## 2017-01-16 DIAGNOSIS — Z955 Presence of coronary angioplasty implant and graft: Secondary | ICD-10-CM

## 2017-01-16 NOTE — Progress Notes (Signed)
Daily Session Note  Patient Details  Name: Larry Mann MRN: [id number] Date of Birth: 09/22/1950 Referring Provider:     Cardiac Rehab from 11/14/2016 in [facility] Cardiac and Pulmonary Rehab  Referring Provider  Kowalski, Bruce MD      Encounter Date: 01/16/2017  Check In: Session Check In - 01/16/17 0842      Check-In   Location  [facility]-Cardiac & Pulmonary Rehab    Staff Present  Meredith Craven, RN BSN;Amanda Sommer, BA, ACSM CEP, Exercise Physiologist;Jessica Hawkins, MA, ACSM RCEP, Exercise Physiologist    Supervising physician immediately available to respond to emergencies  See telemetry face sheet for immediately available ER MD    Medication changes reported      No    Fall or balance concerns reported     No    Warm-up and Cool-down  Performed on first and last piece of equipment    Resistance Training Performed  Yes    VAD Patient?  No      Pain Assessment   Currently in Pain?  No/denies          Social History   Tobacco Use  Smoking Status Former Smoker  . Last attempt to quit: 05/19/2001  . Years since quitting: 15.6  Smokeless Tobacco Never Used    Goals Met:  Independence with exercise equipment Exercise tolerated well No report of cardiac concerns or symptoms Strength training completed today  Goals Unmet:  Not Applicable  Comments: Pt able to follow exercise prescription today without complaint.  Will continue to monitor for progression.    Dr. Mark Miller is Medical Director for [facility] and [facility]. 

## 2017-01-21 VITALS — Ht 65.8 in | Wt 188.0 lb

## 2017-01-21 DIAGNOSIS — I214 Non-ST elevation (NSTEMI) myocardial infarction: Secondary | ICD-10-CM

## 2017-01-21 DIAGNOSIS — Z955 Presence of coronary angioplasty implant and graft: Secondary | ICD-10-CM

## 2017-01-21 NOTE — Progress Notes (Signed)
Daily Session Note  Patient Details  Name: Larry Mann MRN: [id number] Date of Birth: [birth year] Referring Provider:     Cardiac Rehab from 11/14/2016 in [facility] Cardiac and Pulmonary Rehab  Referring Provider  Serafina Royals MD      Encounter Date: 01/21/2017  Check In: Session Check In - 01/21/17 0904      Check-In   Location  [facility]-Cardiac & Pulmonary Rehab    Staff Present  Nyoka Cowden, RN, BSN, Kela Millin, BA, ACSM CEP, Exercise Physiologist;Jessica Luan [place], [place], ACSM RCEP, Exercise Physiologist    Supervising physician immediately available to respond to emergencies  See telemetry face sheet for immediately available ER MD    Medication changes reported      No    Fall or balance concerns reported     No    Warm-up and Cool-down  Performed on first and last piece of equipment    Resistance Training Performed  Yes    VAD Patient?  No      Pain Assessment   Currently in Pain?  No/denies          Social History   Tobacco Use  Smoking Status Former Smoker  . Last attempt to quit: 05/19/2001  . Years since quitting: 15.6  Smokeless Tobacco Never Used    Goals Met:  Independence with exercise equipment Exercise tolerated well No report of cardiac concerns or symptoms Strength training completed today  Goals Unmet:  Not Applicable  Comments: Pt able to follow exercise prescription today without complaint.  Will continue to monitor for progression.    Dr. Emily Filbert is Medical Director for [place] and [facility].

## 2017-01-23 DIAGNOSIS — I214 Non-ST elevation (NSTEMI) myocardial infarction: Secondary | ICD-10-CM

## 2017-01-23 DIAGNOSIS — Z955 Presence of coronary angioplasty implant and graft: Secondary | ICD-10-CM

## 2017-01-23 NOTE — Progress Notes (Signed)
Daily Session Note  Patient Details  Name: Larry Mann MRN: [id number] Date of Birth: 04-19-1950 Referring Provider:     Cardiac Rehab from 11/14/2016 in [facility] Cardiac and Pulmonary Rehab  Referring Provider  Serafina Royals MD      Encounter Date: 01/23/2017  Check In: Session Check In - 01/23/17 0831      Check-In   Location  [facility]-Cardiac & Pulmonary Rehab    Staff Present  Nada Maclachlan, BA, ACSM CEP, Exercise Physiologist;Other;Krista Frederico Hamman, RN BSN    Supervising physician immediately available to respond to emergencies  See telemetry face sheet for immediately available ER MD    Medication changes reported      No    Fall or balance concerns reported     No    Tobacco Cessation  No Change    Warm-up and Cool-down  Performed on first and last piece of equipment    Resistance Training Performed  Yes    VAD Patient?  No      Pain Assessment   Currently in Pain?  No/denies    Multiple Pain Sites  No          Social History   Tobacco Use  Smoking Status Former Smoker  . Last attempt to quit: 05/19/2001  . Years since quitting: 15.6  Smokeless Tobacco Never Used    Goals Met:  Independence with exercise equipment Exercise tolerated well No report of cardiac concerns or symptoms Strength training completed today  Goals Unmet:  Not Applicable  Comments: Pt able to follow exercise prescription today without complaint. 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  Will continue to monitor for progression.    Dr. Emily Filbert is Medical Director for [place] and [facility].

## 2017-01-23 NOTE — Progress Notes (Signed)
Daily Session Note  Patient Details  Name: Larry Mann MRN: [id number] Date of Birth: [birth year] Referring Provider:     Cardiac Rehab from 11/14/2016 in [facility] Cardiac and Pulmonary Rehab  Referring Provider  Serafina Royals MD      Encounter Date: 01/23/2017  Check In: Session Check In - 01/23/17 0831      Check-In   Location  [facility]-Cardiac & Pulmonary Rehab    Staff Present  Nada Maclachlan, BA, ACSM CEP, Exercise Physiologist;Other;Krista Frederico Hamman, RN BSN    Supervising physician immediately available to respond to emergencies  See telemetry face sheet for immediately available ER MD    Medication changes reported      No    Fall or balance concerns reported     No    Tobacco Cessation  No Change    Warm-up and Cool-down  Performed on first and last piece of equipment    Resistance Training Performed  Yes    VAD Patient?  No      Pain Assessment   Currently in Pain?  No/denies    Multiple Pain Sites  No          Social History   Tobacco Use  Smoking Status Former Smoker  . Last attempt to quit: 05/19/2001  . Years since quitting: 15.6  Smokeless Tobacco Never Used    Goals Met:  Independence with exercise equipment Exercise tolerated well No report of cardiac concerns or symptoms Strength training completed today  Goals Unmet:  Not Applicable  Comments: Pt able to follow exercise prescription today without complaint.  Will continue to monitor for progression.    Dr. Emily Filbert is Medical Director for [place] and [facility].

## 2017-01-28 DIAGNOSIS — I214 Non-ST elevation (NSTEMI) myocardial infarction: Secondary | ICD-10-CM

## 2017-01-28 DIAGNOSIS — Z955 Presence of coronary angioplasty implant and graft: Secondary | ICD-10-CM

## 2017-01-28 NOTE — Progress Notes (Signed)
Daily Session Note  Patient Details  Name: Larry Mann MRN: [id number] Date of Birth: [birth year] Referring Provider:     Cardiac Rehab from 11/14/2016 in [facility] Cardiac and Pulmonary Rehab  Referring Provider  Serafina Royals MD      Encounter Date: 01/28/2017  Check In: Session Check In - 01/28/17 0838      Check-In   Location  [facility]-Cardiac & Pulmonary Rehab    Staff Present  Nada Maclachlan, BA, ACSM CEP, Exercise Physiologist;Susanne Bice, RN, BSN, CCRP;Joseph Darrin Nipper, [place], ACSM [place], Exercise Physiologist;Diane Joya Gaskins RN,BSN    Supervising physician immediately available to respond to emergencies  See telemetry face sheet for immediately available ER MD    Medication changes reported      No    Fall or balance concerns reported     No    Warm-up and Cool-down  Performed on first and last piece of equipment    Resistance Training Performed  Yes    VAD Patient?  No      Pain Assessment   Currently in Pain?  No/denies    Multiple Pain Sites  No          Social History   Tobacco Use  Smoking Status Former Smoker  . Last attempt to quit: 05/19/2001  . Years since quitting: 15.7  Smokeless Tobacco Never Used    Goals Met:  Independence with exercise equipment Exercise tolerated well No report of cardiac concerns or symptoms Strength training completed today  Goals Unmet:  Not Applicable  Comments: Pt able to follow exercise prescription today without complaint.  Will continue to monitor for progression.    Dr. Emily Filbert is Medical Director for [place] and [facility].

## 2017-02-04 ENCOUNTER — Encounter: Payer: 59 | Admitting: *Deleted

## 2017-02-04 DIAGNOSIS — Z955 Presence of coronary angioplasty implant and graft: Secondary | ICD-10-CM

## 2017-02-04 DIAGNOSIS — I214 Non-ST elevation (NSTEMI) myocardial infarction: Secondary | ICD-10-CM | POA: Diagnosis not present

## 2017-02-04 NOTE — Progress Notes (Signed)
Cardiac Individual Treatment Plan  Patient Details  Name: Larry Mann MRN: [id number] Date of Birth: 1950-12-29 Referring Provider:     Cardiac Rehab from 11/14/2016 in [facility] Cardiac and Pulmonary Rehab  Referring Provider  Serafina Royals MD      Initial Encounter Date:    Cardiac Rehab from 11/14/2016 in [facility] Cardiac and Pulmonary Rehab  Date  11/14/16  Referring Provider  Serafina Royals MD      Visit Diagnosis: NSTEMI (non-ST elevated myocardial infarction) [facility])  Status post coronary artery stent placement  Patient's Home Medications on Admission:  Current Outpatient Medications:  .  amLODipine (NORVASC) 10 MG tablet, Take 10 mg by mouth daily., Disp: , Rfl:  .  aspirin EC 81 MG tablet, Take 81 mg by mouth daily., Disp: , Rfl:  .  atorvastatin (LIPITOR) 20 MG tablet, Take 20 mg by mouth daily., Disp: , Rfl:  .  glipiZIDE (GLUCOTROL XL) 5 MG 24 hr tablet, Take 5 mg by mouth daily with breakfast., Disp: , Rfl:  .  losartan (COZAAR) 100 MG tablet, Take by mouth., Disp: , Rfl:  .  metFORMIN (GLUCOPHAGE) 1000 MG tablet, Take 1,000 mg by mouth 2 (two) times daily with a meal., Disp: , Rfl:  .  metoprolol tartrate (LOPRESSOR) 25 MG tablet, Take 25 mg by mouth 2 (two) times daily., Disp: , Rfl:  .  potassium chloride (MICRO-K) 10 MEQ CR capsule, Take by mouth., Disp: , Rfl:  .  ticagrelor (BRILINTA) 90 MG TABS tablet, Take by mouth., Disp: , Rfl:   Past Medical History: Past Medical History:  Diagnosis Date  . Anginal pain ([employer])   . Basal cell carcinoma   . Cancer ([place])   . Chicken pox   . Chronic kidney disease   . Coronary artery disease   . Diabetes mellitus without complication ([place])   . Headache   . Hypertension   . Myocardial infarction ([place])   . Plantar fasciitis     Tobacco Use: Social History   Tobacco Use  Smoking Status Former Smoker  . Last attempt to quit: 05/19/2001  . Years since quitting: 15.7  Smokeless Tobacco Never Used    Labs: Recent  Review Flowsheet Data    There is no flowsheet data to display.       Exercise Target Goals:    Exercise Program Goal: Individual exercise prescription set with THRR, safety & activity barriers. Participant demonstrates ability to understand and report RPE using BORG scale, to self-measure pulse accurately, and to acknowledge the importance of the exercise prescription.  Exercise Prescription Goal: Starting with aerobic activity 30 plus minutes a day, 3 days per week for initial exercise prescription. Provide home exercise prescription and guidelines that participant acknowledges understanding prior to discharge.  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 HR  84 bpm  125 bpm    Max Ex. BP  146/64  144/66    2 Minute Post BP  128/64  -       Oxygen Initial Assessment:   Oxygen Re-Evaluation:   Oxygen Discharge (Final Oxygen Re-Evaluation):   Initial Exercise Prescription: Initial Exercise Prescription - 11/14/16 1500      Date of Initial Exercise RX and Referring Provider   Date  11/14/16     Referring Provider  Serafina Royals MD      Treadmill   MPH  2.9    Grade  2    Minutes  15    METs  4.02      NuStep   Level  3    SPM  80    Minutes  15    METs  3      REL-XR   Level  2    Speed  50    Minutes  15    METs  3      Prescription Details   Frequency (times per week)  2    Duration  Progress to 45 minutes of aerobic exercise without signs/symptoms of physical distress      Intensity   THRR 40-80% of Max Heartrate  113-142    Ratings of Perceived Exertion  11-13    Perceived Dyspnea  0-4      Progression   Progression  Continue to progress workloads to maintain intensity without signs/symptoms of physical distress.      Resistance Training   Training Prescription  Yes    Weight  3 lbs    Reps  10-15       Perform Capillary Blood Glucose checks as needed.  Exercise Prescription Changes:  Exercise Prescription Changes    Row Name 11/14/16 1500 11/26/16 1400 12/05/16 0900 12/13/16 1000 12/25/16 1200     Response to Exercise   Blood Pressure (Admit)  116/64  138/74  -  120/70  110/68   Blood Pressure (Exercise)  146/64  152/64  -  140/64  110/62   Blood Pressure (Exit)  128/64  134/60  -  124/60  98/58   Heart Rate (Admit)  84 bpm  99 bpm  -  84 bpm  87 bpm   Heart Rate (Exercise)  125 bpm  112 bpm  -  113 bpm  116 bpm   Heart Rate (Exit)  99 bpm  84 bpm  -  84 bpm  83 bpm   Oxygen Saturation (Admit)  97 %  -  -  -  -   Oxygen Saturation (Exercise)  98 %  -  -  -  -   Rating of Perceived Exertion (Exercise)  11  12  -  12  12  Symptoms  none  none  -  none  none   Comments  walk test results  first full day of exercise  -  -  -   Duration  -  Progress to 45 minutes of aerobic exercise without signs/symptoms of physical distress  -  Continue with 45 min of aerobic exercise without signs/symptoms of physical distress.  Continue with 45 min of aerobic exercise without signs/symptoms of physical distress.   Intensity  -  THRR unchanged  -  THRR  unchanged  THRR unchanged     Progression   Progression  -  Continue to progress workloads to maintain intensity without signs/symptoms of physical distress.  -  Continue to progress workloads to maintain intensity without signs/symptoms of physical distress.  Continue to progress workloads to maintain intensity without signs/symptoms of physical distress.   Average METs  -  2.7  -  3.75  4.37     Resistance Training   Training Prescription  -  Yes  -  Yes  Yes   Weight  -  4 lbs  -  4 lbs  4 lb   Reps  -  10-15  -  10-15  10-15     Interval Training   Interval Training  -  No  -  No  No     Treadmill   MPH  -  -  -  3  3   Grade  -  -  -  3  3   Minutes  -  -  -  15  15   METs  -  -  -  4.54  4.54     NuStep   Level  -  3  -  3  3   Minutes  -  15  -  15  15   METs  -  2.6  -  3.9  4.2     REL-XR   Level  -  2  -  2  -   Minutes  -  15  -  15  -   METs  -  2.8  -  2.8  -     Home Exercise Plan   Plans to continue exercise at  -  -  Home (comment) walking  Home (comment) walking  Home (comment) walking   Frequency  -  -  Add 4 additional days to program exercise sessions.  Add 4 additional days to program exercise sessions.  Add 4 additional days to program exercise sessions.   Initial Home Exercises Provided  -  -  12/05/16  12/05/16  12/05/16   Row Name 01/09/17 1400 01/21/17 1500           Response to Exercise   Blood Pressure (Admit)  120/72  124/74      Blood Pressure (Exercise)  116/58  134/80      Blood Pressure (Exit)  120/68  114/60      Heart Rate (Admit)  97 bpm  94 bpm      Heart Rate (Exercise)  118 bpm  122 bpm      Heart Rate (Exit)  91 bpm  100 bpm      Rating of Perceived Exertion (Exercise)  12  12      Symptoms  none  none      Duration  Continue with 45 min of aerobic exercise without signs/symptoms of physical distress.  Continue with 45 min of aerobic exercise without  signs/symptoms of physical distress.      Intensity  THRR unchanged  THRR  unchanged        Progression   Progression  Continue to progress workloads to maintain intensity without signs/symptoms of physical distress.  Continue to progress workloads to maintain intensity without signs/symptoms of physical distress.      Average METs  4.31  4.41        Resistance Training   Training Prescription  Yes  Yes      Weight  4 lb  4 lb      Reps  10-15  10-15        Interval Training   Interval Training  No  No        Treadmill   MPH  3  3      Grade  3  3      Minutes  15  15      METs  4.54  4.54        NuStep   Level  3  3      Minutes  15  15      METs  4.1  3.4        REL-XR   Level  2  5      Minutes  15  15      METs  4.3  5.3        Home Exercise Plan   Plans to continue exercise at  Home (comment) walking  Home (comment) walking      Frequency  Add 4 additional days to program exercise sessions.  Add 4 additional days to program exercise sessions.      Initial Home Exercises Provided  12/05/16  12/05/16         Exercise Comments:  Exercise Comments    Row Name 11/26/16 6503 01/23/17 0952 02/04/17 0827       Exercise Comments  First full day of exercise!  Patient was oriented to gym and equipment including functions, settings, policies, and procedures.  Patient's individual exercise prescription and treatment plan were reviewed.  All starting workloads were established based on the results of the 6 minute walk test done at initial orientation visit.  The plan for exercise progression was also introduced and progression will be customized based on patient's performance and goals.  Pt. left early today due to needing to be home for a plumber to come.   Larry Mann graduated today from cardiac rehab with 35 sessions completed.  Details of the patient's exercise prescription and what He needs to do in order to continue the prescription and progress were discussed with patient.  Patient was given a copy of prescription and goals.  Patient verbalized  understanding.  [name] plans to continue to exercise by walking at home.        Exercise Goals and Review:  Exercise Goals    Row Name 11/14/16 1551             Exercise Goals   Increase Physical Activity  Yes       Intervention  Provide advice, education, support and counseling about physical activity/exercise needs.;Develop an individualized exercise prescription for aerobic and resistive training based on initial evaluation findings, risk stratification, comorbidities and participant's personal goals.       Expected Outcomes  Achievement of increased cardiorespiratory fitness and enhanced flexibility, muscular endurance and strength shown through measurements of functional capacity and personal statement of participant.       Increase  Strength and Stamina  Yes       Intervention  Provide advice, education, support and counseling about physical activity/exercise needs.;Develop an individualized exercise prescription for aerobic and resistive training based on initial evaluation findings, risk stratification, comorbidities and participant's personal goals.       Expected Outcomes  Achievement of increased cardiorespiratory fitness and enhanced flexibility, muscular endurance and strength shown through measurements of functional capacity and personal statement of participant.       Able to understand and use rate of perceived exertion (RPE) scale  Yes       Intervention  Provide education and explanation on how to use RPE scale       Expected Outcomes  Short Term: Able to use RPE daily in rehab to express subjective intensity level;Long Term:  Able to use RPE to guide intensity level when exercising independently       Able to understand and use Dyspnea scale  Yes       Intervention  Provide education and explanation on how to use Dyspnea scale       Expected Outcomes  Short Term: Able to use Dyspnea scale daily in rehab to express subjective sense of shortness of breath during exertion;Long  Term: Able to use Dyspnea scale to guide intensity level when exercising independently       Knowledge and understanding of Target Heart Rate Range (THRR)  Yes       Intervention  Provide education and explanation of THRR including how the numbers were predicted and where they are located for reference       Expected Outcomes  Short Term: Able to state/look up THRR;Long Term: Able to use THRR to govern intensity when exercising independently;Short Term: Able to use daily as guideline for intensity in rehab       Able to check pulse independently  Yes       Intervention  Provide education and demonstration on how to check pulse in carotid and radial arteries.;Review the importance of being able to check your own pulse for safety during independent exercise       Expected Outcomes  Short Term: Able to explain why pulse checking is important during independent exercise;Long Term: Able to check pulse independently and accurately       Understanding of Exercise Prescription  Yes       Intervention  Provide education, explanation, and written materials on patient's individual exercise prescription       Expected Outcomes  Short Term: Able to explain program exercise prescription;Long Term: Able to explain home exercise prescription to exercise independently          Exercise Goals Re-Evaluation : Exercise Goals Re-Evaluation    Row Name 11/26/16 0929 12/05/16 0902 12/13/16 1042 12/25/16 1214 01/02/17 0907     Exercise Goal Re-Evaluation   Exercise Goals Review  Able to understand and use rate of perceived exertion (RPE) scale;Knowledge and understanding of Target Heart Rate Range (THRR);Understanding of Exercise Prescription  Increase Physical Activity;Increase Strength and Stamina;Able to understand and use rate of perceived exertion (RPE) scale;Knowledge and understanding of Target Heart Rate Range (THRR);Able to check pulse independently;Understanding of Exercise Prescription  Increase Physical  Activity;Increase Strength and Stamina  Increase Physical Activity;Increase Strength and Stamina  Increase Physical Activity;Increase Strength and Stamina;Understanding of Exercise Prescription   Comments  Reviewed RPE scale, THR and program prescription with pt today.  Pt voiced understanding and was given a copy of goals to take home.   Larry Mann is  off to a good start in rehab.  He has been walking some at home.  Reviewed home exercise with pt today.  Pt plans to continue walking at home for exercise.  Reviewed THR, pulse, RPE, sign and symptoms, NTG use, and when to call 911 or MD.  Also discussed weather considerations and indoor options.  Pt voiced understanding. He feels that his strength and stamina are getting better.   Larry Mann continues to do well in rehab.  He is now up to 3.0 mph with 3% grade on the treadmill.  He is making progress even though he did not really want to come to rehab.  We will continue to monitor his progress.  Larry Mann continues to do well in rehab.  Staff will continue to monitor progress.  Larry Mann continues to do well in rehab.  He is walking at home on his off days.  He was out using his chainsaw earlier this week.  He is now doing level 4 on the NuStep,  We will continue to monitor his progression.    Expected Outcomes  Short: Use RPE daily to regulate intensity.  Long: Follow program prescription in THR.  Short: Continue to walk at home on his off days and monitor his heart rate.  Long: Continue to work on Office manager.   Short: Talk about adding in intervals.  Long: Continue to exercise on off days at home.   Short - Larry Mann will add intervals while at rehab.  Long - Larry Mann will continue to exercise on his own.  Short: Talk about adding in intervals.  Long: Continue to exercise independently.   [place] Name 01/21/17 1527             Exercise Goal Re-Evaluation   Exercise Goals Review  Increase Physical Activity;Increase Strength and Stamina;Understanding of Exercise  Prescription       Comments  Larry Mann is already nearing graduation!!  He has surprised himself in wanting to actually complete the program.  He has done well in rehab and up to 5.3 METs on the XR.  We will continue to monitor his progression.  And his timing to graduate is perfect as his insurance will be changing next week.        Expected Outcomes  Short: Graduation. Long: Continue to exercise independently.           Discharge Exercise Prescription (Final Exercise Prescription Changes): Exercise Prescription Changes - 01/21/17 1500      Response to Exercise   Blood Pressure (Admit)  124/74    Blood Pressure (Exercise)  134/80    Blood Pressure (Exit)  114/60    Heart Rate (Admit)  94 bpm    Heart Rate (Exercise)  122 bpm    Heart Rate (Exit)  100 bpm    Rating of Perceived Exertion (Exercise)  12    Symptoms  none    Duration  Continue with 45 min of aerobic exercise without signs/symptoms of physical distress.    Intensity  THRR unchanged      Progression   Progression  Continue to progress workloads to maintain intensity without signs/symptoms of physical distress.    Average METs  4.41      Resistance Training   Training Prescription  Yes    Weight  4 lb    Reps  10-15      Interval Training   Interval Training  No      Treadmill   MPH  3    Grade  3    Minutes  15    METs  4.54      NuStep   Level  3    Minutes  15    METs  3.4      REL-XR   Level  5    Minutes  15    METs  5.3      Home Exercise Plan   Plans to continue exercise at  Home (comment) walking    Frequency  Add 4 additional days to program exercise sessions.    Initial Home Exercises Provided  12/05/16       Nutrition:  Target Goals: Understanding of nutrition guidelines, daily intake of sodium <1541m, cholesterol <2037m calories 30% from fat and 7% or less from saturated fats, daily to have 5 or more servings of fruits and vegetables.  Biometrics: Pre Biometrics - 11/14/16 1552       Pre Biometrics   Height  5' 5.8" (1.671 m)    Weight  186 lb 1.6 oz (84.4 kg)    Waist Circumference  41 inches    Hip Circumference  40.5 inches    Waist to Hip Ratio  1.01 %    BMI (Calculated)  30.23    Single Leg Stand  7.67 seconds      Post Biometrics - 01/21/17 096283     Post  Biometrics   Height  5' 5.8" (1.671 m)    Weight  188 lb (85.3 kg)    Waist Circumference  40.5 inches    Hip Circumference  42 inches    Waist to Hip Ratio  0.96 %    BMI (Calculated)  30.54    Single Leg Stand  12.14 seconds       Nutrition Therapy Plan and Nutrition Goals: Nutrition Therapy & Goals - 12/24/16 1106      Nutrition Therapy   Diet  basic      Personal Nutrition Goals   Personal Goal #2  make small positive diet changes as able and ready.     Comments  Mr. JaDockentates he will not be making many, if any, diet changes. Encouraged small changes such as incorporating additional vegetables gradually. Discussed meal and menu options, saturated fat sources, portion control of carbs for blood sugar control.      Intervention Plan   Intervention  Prescribe, educate and counsel regarding individualized specific dietary modifications aiming towards targeted core components such as weight, hypertension, lipid management, diabetes, heart failure and other comorbidities.;Nutrition handout(s) given to patient.    Expected Outcomes  Short Term Goal: Understand basic principles of dietary content, such as calories, fat, sodium, cholesterol and nutrients.;Short Term Goal: A plan has been developed with personal nutrition goals set during dietitian appointment.;Long Term Goal: Adherence to prescribed nutrition plan.       Nutrition Discharge: Rate Your Plate Scores: Nutrition Assessments - 01/21/17 1012      MEDFICTS Scores   Post Score  -- declined assessment       Nutrition Goals Re-Evaluation: Nutrition Goals Re-Evaluation    [place] Croft[place] 12/05/16 0908 01/02/17 0913           Goals    Current Weight  184 lb (83.5 kg)  -      Nutrition Goal  Nutrition appt 10/16 with wife!!  Make small tweeks in diet.  Focus on sugar and salt.       Comment  EaHedy Camaraas an appt scheduled to meet with dietician per  request of his wife.  They are seeking more advice on ways to tweak diet and provide enough variety.  Larry Mann and his wife met with dietician and only needed to make small adjustments.  They are well on their way.  She was happy to hear they were doing the right things.       Expected Outcome  Short: Meet with dietician.  Long: Stick with heart healthy diet.   Short: make minor tweeks in salt by reading labels.  Long: Stick with heart healthy diet         Nutrition Goals Discharge (Final Nutrition Goals Re-Evaluation): Nutrition Goals Re-Evaluation - 01/02/17 0913      Goals   Nutrition Goal  Make small tweeks in diet.  Focus on sugar and salt.     Comment  Larry Mann and his wife met with dietician and only needed to make small adjustments.  They are well on their way.  She was happy to hear they were doing the right things.     Expected Outcome  Short: make minor tweeks in salt by reading labels.  Long: Stick with heart healthy diet       Psychosocial: Target Goals: Acknowledge presence or absence of significant depression and/or stress, maximize coping skills, provide positive support system. Participant is able to verbalize types and ability to use techniques and skills needed for reducing stress and depression.   Initial Review & Psychosocial Screening: Initial Psych Review & Screening - 11/14/16 1322      Initial Review   Current issues with  None Identified      Family Dynamics   Good Support System?  Yes Wife      Barriers   Psychosocial barriers to participate in program  There are no identifiable barriers or psychosocial needs.;The patient should benefit from training in stress management and relaxation.      Screening Interventions   Interventions  Encouraged to exercise  Declined to complete the QOL questionnaire        Quality of Life Scores:  Quality of Life - 01/21/17 1013      Quality of Life Scores   Health/Function Pre  -- declined assessment       PHQ-9: Recent Review Flowsheet Data    Depression screen [facility] 2/9 11/14/2016   Decreased Interest 0   Down, Depressed, Hopeless 0   PHQ - 2 Score 0   Altered sleeping 0   Tired, decreased energy 0   Change in appetite 0   Feeling bad or failure about yourself  0   Trouble concentrating 0   Moving slowly or fidgety/restless 0   Suicidal thoughts 0   PHQ-9 Score 0   Difficult doing work/chores Not difficult at all     Interpretation of Total Score  Total Score Depression Severity:  1-4 = Minimal depression, 5-9 = Mild depression, 10-14 = Moderate depression, 15-19 = Moderately severe depression, 20-27 = Severe depression   Psychosocial Evaluation and Intervention: Psychosocial Evaluation - 01/16/17 0953      Psychosocial Evaluation & Interventions   Interventions  Encouraged to exercise with the program and follow exercise prescription;Stress management education    Comments  Counselor met with Mr. Laswell today for initial psychosocial evaluation. "Larry Mann" is a 66 year old who had a heart attack and stent inserted while in [place] at a baseball game this past July.  He has a limited support system with a spouse and a few friends and cousins.  He reports a history  of bladder cancer and prostate cancer and is now 3 years cancer free.  He states he sleeps well and has a good appetite.  Larry Mann denies a history of depression or anxiety and is typically in a "good" mood.  He reports no stress in his life and "wouldn't be here if it weren't for his wife making" him come.  Larry Mann appeared guarded and irritable with talking to this counselor.  He declined completing the PHQ-9 for depression at his orientation for this program.  Larry Mann states he has "no goals" for this program.  Staff will follow.    Expected  Outcomes  Larry Mann will benefit from consistent exercise while in this program.      Continue Psychosocial Services   Follow up required by staff       Psychosocial Re-Evaluation: Psychosocial Re-Evaluation    [place] Name 12/05/16 0910 01/02/17 0915           Psychosocial Re-Evaluation   Current issues with  Current Stress Concerns  Current Stress Concerns      Comments  He has had bladder and prostate removed and that has been his biggest obstacle.  His heart attack has not been a big deal as a results.  He is very good at coping with his illnesses.  He is sleeping great; he get about 7-8 hours at night and woken up by his shoulder pain, but able to go back to sleep easily.  His wife is very supportive and has encouraged him to do rehab.   Larry Mann has been doing well.  His wife cleared him from rehab and he continues to come as he likes the routine and company of his classamates.  He continues to cope with everyhting well and remains postive.  He has a great support system and continues to sleep well.      Expected Outcomes  Short: Continue to attend rehab for physical benefit.  Long: Maintain postive attitude.,  Short: Continue to attend rehab for physical benefit.  Long: Maintain postive attitude.,      Interventions  Encouraged to attend Cardiac Rehabilitation for the exercise;Stress management education  Encouraged to attend Cardiac Rehabilitation for the exercise;Stress management education      Continue Psychosocial Services   Follow up required by staff  Follow up required by staff        Initial Review   Source of Stress Concerns  Chronic Illness  Chronic Illness         Psychosocial Discharge (Final Psychosocial Re-Evaluation): Psychosocial Re-Evaluation - 01/02/17 0915      Psychosocial Re-Evaluation   Current issues with  Current Stress Concerns    Comments  Larry Mann has been doing well.  His wife cleared him from rehab and he continues to come as he likes the routine and company of his  classamates.  He continues to cope with everyhting well and remains postive.  He has a great support system and continues to sleep well.    Expected Outcomes  Short: Continue to attend rehab for physical benefit.  Long: Maintain postive attitude.,    Interventions  Encouraged to attend Cardiac Rehabilitation for the exercise;Stress management education    Continue Psychosocial Services   Follow up required by staff      Initial Review   Source of Stress Concerns  Chronic Illness       Vocational Rehabilitation: Provide vocational rehab assistance to qualifying candidates.   Vocational Rehab Evaluation & Intervention: Vocational Rehab - 11/14/16 1325  Initial Vocational Rehab Evaluation & Intervention   Assessment shows need for Vocational Rehabilitation  No       Education: Education Goals: Education classes will be provided on a variety of topics geared toward better understanding of heart health and risk factor modification. Participant will state understanding/return demonstration of topics presented as noted by education test scores.  Learning Barriers/Preferences: Learning Barriers/Preferences - 11/14/16 1324      Learning Barriers/Preferences   Learning Barriers  None    Learning Preferences  None       Education Topics: General Nutrition Guidelines/Fats and Fiber: -Group instruction provided by verbal, written material, models and posters to present the general guidelines for heart healthy nutrition. Gives an explanation and review of dietary fats and fiber.   Cardiac Rehab from 02/04/2017 in [facility] Cardiac and Pulmonary Rehab  Date  12/24/16  Educator  PI  Instruction Review Code  1- Verbalizes Understanding      Controlling Sodium/Reading Food Labels: -Group verbal and written material supporting the discussion of sodium use in heart healthy nutrition. Review and explanation with models, verbal and written materials for utilization of the food label.   Cardiac  Rehab from 02/04/2017 in [facility] Cardiac and Pulmonary Rehab  Date  12/31/16  Educator  CR  Instruction Review Code  1- Verbalizes Understanding      Exercise Physiology & Risk Factors: - Group verbal and written instruction with models to review the exercise physiology of the cardiovascular system and associated critical values. Details cardiovascular disease risk factors and the goals associated with each risk factor.   Cardiac Rehab from 02/04/2017 in [facility] Cardiac and Pulmonary Rehab  Date  01/07/17  Educator  [facility]  Instruction Review Code  1- Verbalizes Understanding      Aerobic Exercise & Resistance Training: - Gives group verbal and written discussion on the health impact of inactivity. On the components of aerobic and resistive training programs and the benefits of this training and how to safely progress through these programs.   Cardiac Rehab from 02/04/2017 in [facility] Cardiac and Pulmonary Rehab  Date  01/09/17  Educator  [facility]  Instruction Review Code  1- Verbalizes Understanding      Flexibility, Balance, General Exercise Guidelines: - Provides group verbal and written instruction on the benefits of flexibility and balance training programs. Provides general exercise guidelines with specific guidelines to those with heart or lung disease. Demonstration and skill practice provided.   Cardiac Rehab from 02/04/2017 in [facility] Cardiac and Pulmonary Rehab  Date  01/14/17  Educator  AS  Instruction Review Code  1- Verbalizes Understanding      Stress Management: - Provides group verbal and written instruction about the health risks of elevated stress, cause of high stress, and healthy ways to reduce stress.   Cardiac Rehab from 02/04/2017 in [facility] Cardiac and Pulmonary Rehab  Date  01/21/17  Educator  [facility]  Instruction Review Code  1- Verbalizes Understanding      Depression: - Provides group verbal and written instruction on the correlation between heart/lung disease and depressed  mood, treatment options, and the stigmas associated with seeking treatment.   Cardiac Rehab from 02/04/2017 in [facility] Cardiac and Pulmonary Rehab  Date  12/17/16  Educator  [facility]  Instruction Review Code  1- Verbalizes Understanding      Anatomy & Physiology of the Heart: - Group verbal and written instruction and models provide basic cardiac anatomy and physiology, with the coronary electrical and arterial systems. Review of: AMI, Angina,  Valve disease, Heart Failure, Cardiac Arrhythmia, Pacemakers, and the ICD.   Cardiac Rehab from 02/04/2017 in [facility] Cardiac and Pulmonary Rehab  Date  01/23/17  Educator  [place]  Instruction Review Code  1- Verbalizes Understanding      Cardiac Procedures: - Group verbal and written instruction to review commonly prescribed medications for heart disease. Reviews the medication, class of the drug, and side effects. Includes the steps to properly store meds and maintain the prescription regimen. (beta blockers and nitrates)   Cardiac Rehab from 02/04/2017 in [facility] Cardiac and Pulmonary Rehab  Date  01/28/17  Educator  SB  Instruction Review Code  1- Verbalizes Understanding      Cardiac Medications I: - Group verbal and written instruction to review commonly prescribed medications for heart disease. Reviews the medication, class of the drug, and side effects. Includes the steps to properly store meds and maintain the prescription regimen.   Cardiac Rehab from 02/04/2017 in [facility] Cardiac and Pulmonary Rehab  Date  02/04/17  Educator  KS  Instruction Review Code  1- Verbalizes Understanding      Cardiac Medications II: -Group verbal and written instruction to review commonly prescribed medications for heart disease. Reviews the medication, class of the drug, and side effects. (all other drug classes)    Go Sex-Intimacy & Heart Disease, Get SMART - Goal Setting: - Group verbal and written instruction through game format to discuss heart disease and the  return to sexual intimacy. Provides group verbal and written material to discuss and apply goal setting through the application of the S.M.A.R.T. Method.   Cardiac Rehab from 02/04/2017 in [facility] Cardiac and Pulmonary Rehab  Date  01/28/17  Educator  SB  Instruction Review Code  1- Verbalizes Understanding      Other Matters of the Heart: - Provides group verbal, written materials and models to describe Heart Failure, Angina, Valve Disease, Peripheral Artery Disease, and Diabetes in the realm of heart disease. Includes description of the disease process and treatment options available to the cardiac patient.   Cardiac Rehab from 02/04/2017 in [facility] Cardiac and Pulmonary Rehab  Date  01/23/17  Educator  [place]  Instruction Review Code  1- Verbalizes Understanding      Exercise & Equipment Safety: - Individual verbal instruction and demonstration of equipment use and safety with use of the equipment.   Cardiac Rehab from 02/04/2017 in [facility] Cardiac and Pulmonary Rehab  Date  11/14/16  Educator  Sb  Instruction Review Code  1- Verbalizes Understanding      Infection Prevention: - Provides verbal and written material to individual with discussion of infection control including proper hand washing and proper equipment cleaning during exercise session.   Cardiac Rehab from 02/04/2017 in [facility] Cardiac and Pulmonary Rehab  Date  11/14/16  Educator  Sb  Instruction Review Code  1- Verbalizes Understanding      Falls Prevention: - Provides verbal and written material to individual with discussion of falls prevention and safety.   Cardiac Rehab from 02/04/2017 in [facility] Cardiac and Pulmonary Rehab  Date  11/14/16  Educator  SB  Instruction Review Code  1- Verbalizes Understanding      Diabetes: - Individual verbal and written instruction to review signs/symptoms of diabetes, desired ranges of glucose level fasting, after meals and with exercise. Acknowledge that pre and post exercise glucose  checks will be done for 3 sessions at entry of program.   Cardiac Rehab from 02/04/2017 in [facility] Cardiac and Pulmonary Rehab  Date  11/14/16  Educator  Sb  Instruction Review Code  1- Verbalizes Understanding      Other: -Provides group and verbal instruction on various topics (see comments)   Cardiac Rehab from 02/04/2017 in [facility] Cardiac and Pulmonary Rehab  Date  01/16/17 [facility] Your Numbers and RIsk Factors]  Educator  [facility]  Instruction Review Code  1- Verbalizes Understanding       Knowledge Questionnaire Score: Knowledge Questionnaire Score - 01/21/17 1013      Knowledge Questionnaire Score   Pre Score  Declined to complete this questionnaire    Post Score  declined assessment       Core Components/Risk Factors/Patient Goals at Admission: Personal Goals and Risk Factors at Admission - 11/14/16 1321      Core Components/Risk Factors/Patient Goals on Admission    Weight Management  Yes;Weight Loss    Intervention  Weight Management: Develop a combined nutrition and exercise program designed to reach desired caloric intake, while maintaining appropriate intake of nutrient and fiber, sodium and fats, and appropriate energy expenditure required for the weight goal.;Weight Management: Provide education and appropriate resources to help participant work on and attain dietary goals.;Obesity: Provide education and appropriate resources to help participant work on and attain dietary goals.    Admit Weight  186 lb 1.6 oz (84.4 kg)    Goal Weight: Short Term  184 lb (83.5 kg)    Goal Weight: Long Term  180 lb (81.6 kg)    Expected Outcomes  Short Term: Continue to assess and modify interventions until short term weight is achieved;Long Term: Adherence to nutrition and physical activity/exercise program aimed toward attainment of established weight goal    Diabetes  Yes    Intervention  Provide education about signs/symptoms and action to take for hypo/hyperglycemia.;Provide education about  proper nutrition, including hydration, and aerobic/resistive exercise prescription along with prescribed medications to achieve blood glucose in normal ranges: Fasting glucose 65-99 mg/dL    Expected Outcomes  Short Term: Participant verbalizes understanding of the signs/symptoms and immediate care of hyper/hypoglycemia, proper foot care and importance of medication, aerobic/resistive exercise and nutrition plan for blood glucose control.;Long Term: Attainment of HbA1C < 7%.    Hypertension  Yes    Intervention  Provide education on lifestyle modifcations including regular physical activity/exercise, weight management, moderate sodium restriction and increased consumption of fresh fruit, vegetables, and low fat dairy, alcohol moderation, and smoking cessation.;Monitor prescription use compliance.    Expected Outcomes  Short Term: Continued assessment and intervention until BP is < 140/95m HG in hypertensive participants. < 130/857mHG in hypertensive participants with diabetes, heart failure or chronic kidney disease.;Long Term: Maintenance of blood pressure at goal levels.    Lipids  Yes    Intervention  Provide education and support for participant on nutrition & aerobic/resistive exercise along with prescribed medications to achieve LDL <7015mHDL >25m84m  Expected Outcomes  Short Term: Participant states understanding of desired cholesterol values and is compliant with medications prescribed. Participant is following exercise prescription and nutrition guidelines.;Long Term: Cholesterol controlled with medications as prescribed, with individualized exercise RX and with personalized nutrition plan. Value goals: LDL < 70mg53mL > 40 mg.       Core Components/Risk Factors/Patient Goals Review:  Goals and Risk Factor Review    Row Name 12/05/16 0904 01/02/17 0910           Core Components/Risk Factors/Patient Goals Review   Personal Goals Review  Weight  Management/Obesity;Hypertension;Lipids;Diabetes  Weight  Management/Obesity;Hypertension;Lipids;Diabetes      Review  Larry Mann has been doing well in rehab.  His weight is down some today to 184 lbs.  He is not checking his blood sugars at home but not feel that he is having any swings.  Talked about the importance of keeping his levels stable.  He says that it does go up and down.  His blood pressures have been good in the program.  He does not check it at home, but says he goes to doctor enough to get it checked.  He is doing well on his medications and not having any problems.   Larry Mann continues to be steady in his weight around 184lbs.   He has not noticed any swings with his blood sugars.  His meds have been working.  He continues to do well with his blood pressures.        Expected Outcomes  Short: Continue to maintain weight.  Long: Continue to work on risk factor modifications.   Short: Continue to maintain weight and blood pressures.  Long: Continue to monitor risk factor modificaitons.          Core Components/Risk Factors/Patient Goals at Discharge (Final Review):  Goals and Risk Factor Review - 01/02/17 0910      Core Components/Risk Factors/Patient Goals Review   Personal Goals Review  Weight Management/Obesity;Hypertension;Lipids;Diabetes    Review  Larry Mann continues to be steady in his weight around 184lbs.   He has not noticed any swings with his blood sugars.  His meds have been working.  He continues to do well with his blood pressures.      Expected Outcomes  Short: Continue to maintain weight and blood pressures.  Long: Continue to monitor risk factor modificaitons.        ITP Comments: ITP Comments    Row Name 11/14/16 1311 11/20/16 1124 12/18/16 0645 12/31/16 1232 01/03/17 1028   ITP Comments  Medical review completed.  INitial ITP sent to Medical Director for review, changes if necessary and signature.   Diagnosis documentation can be found in CARE EVERYWHERE 10/17/2016 Office Visit  30  day review. Continue with ITP unless directed changes per Medical Director review.    30 day Review. Continue with ITP unless directed changes per Medical Director Review.   EKG disclosure from today's session sent to Dr Nehemiah Massed for review.  Multiple PVC's recoreded,more than usually seen in other sessions. FYI to Dr Nehemiah Massed.  No symptoms repoerted and Larry Mann did not "feel any of the irregular beats".   Note returned from Dr Nehemiah Massed.  No changes made and Dr Nehemiah Massed will review concnern at next office visit with Larry Mann.    [place] Name 01/15/17 [phone] 02/04/17 0832         ITP Comments  30 day review. Continue with ITP unless directed changes per Medical Director review.   Larry Mann graduated today.  Discharge ITP created and sent to Dr. Sabra Heck to be reviewed and signed.  Discharge Summary sent         Comments: Discharge ITP

## 2017-02-04 NOTE — Progress Notes (Signed)
Discharge Progress Report  Patient Details  Name: Larry Mann MRN: [id number] Date of Birth: 1950/09/10 Referring Provider:     Cardiac Rehab from 11/14/2016 in [facility]  Referring Provider  Serafina Royals MD       Number of Visits: 35/36  Reason for Discharge:  Patient reached a stable level of exercise. Patient independent in their exercise. Patient has met program and personal goals.  Smoking History:  Social History   Tobacco Use  Smoking Status Former Smoker  . Last attempt to quit: 05/19/2001  . Years since quitting: 15.7  Smokeless Tobacco Never Used    Diagnosis:  NSTEMI (non-ST elevated myocardial infarction) ([place])  Status post coronary artery stent placement  ADL [facility]:   Initial Exercise Prescription: Initial Exercise Prescription - 11/14/16 1500      Date of Initial Exercise RX and Referring Provider   Date  11/14/16    Referring Provider  Serafina Royals MD      Treadmill   MPH  2.9    Grade  2    Minutes  15    METs  4.02      NuStep   Level  3    SPM  80    Minutes  15    METs  3      REL-XR   Level  2    Speed  50    Minutes  15    METs  3      Prescription Details   Frequency (times per week)  2    Duration  Progress to 45 minutes of aerobic exercise without signs/symptoms of physical distress      Intensity   THRR 40-80% of Max Heartrate  113-142    Ratings of Perceived Exertion  11-13    Perceived Dyspnea  0-4      Progression   Progression  Continue to progress workloads to maintain intensity without signs/symptoms of physical distress.      Resistance Training   Training Prescription  Yes    Weight  3 lbs    Reps  10-15       Discharge Exercise Prescription (Final Exercise Prescription Changes): Exercise Prescription Changes - 01/21/17 1500      Response to Exercise   Blood Pressure (Admit)  124/74    Blood Pressure (Exercise)  134/80    Blood Pressure (Exit)  114/60    Heart Rate  (Admit)  94 bpm    Heart Rate (Exercise)  122 bpm    Heart Rate (Exit)  100 bpm    Rating of Perceived Exertion (Exercise)  12    Symptoms  none    Duration  Continue with 45 min of aerobic exercise without signs/symptoms of physical distress.    Intensity  THRR unchanged      Progression   Progression  Continue to progress workloads to maintain intensity without signs/symptoms of physical distress.    Average METs  4.41      Resistance Training   Training Prescription  Yes    Weight  4 lb    Reps  10-15      Interval Training   Interval Training  No      Treadmill   MPH  3    Grade  3    Minutes  15    METs  4.54      NuStep   Level  3    Minutes  15    METs  3.4      REL-XR   Level  5    Minutes  15    METs  5.3      Home Exercise Plan   Plans to continue exercise at  Home (comment) walking    Frequency  Add 4 additional days to program exercise sessions.    Initial Home Exercises Provided  12/05/16       Functional Capacity: 6 Minute Walk    Row Name 11/14/16 1549 01/21/17 0907       6 Minute Walk   Phase  Initial  Discharge    Distance  1570 feet  1635 feet    Distance % Change  -  4 %    Distance Feet Change  -  65 ft    Walk Time  6 minutes  6 minutes    # of Rest Breaks  0  0    MPH  2.97  3.09    METS  3.94  3.94    RPE  11  9    VO2 Peak  13.78  13.8    Symptoms  No  -    Resting HR  84 bpm  97 bpm    Resting BP  116/64  124/60    Resting Oxygen Saturation   97 %  97 %    Exercise Oxygen Saturation  during 6 min walk  98 %  97 %    Max Ex. HR  84 bpm  125 bpm    Max Ex. BP  146/64  144/66    2 Minute Post BP  128/64  -       Psychological, QOL, Others - Outcomes: PHQ 2/9: Depression screen PHQ 2/9 11/14/2016  Decreased Interest 0  Down, Depressed, Hopeless 0  PHQ - 2 Score 0  Altered sleeping 0  Tired, decreased energy 0  Change in appetite 0  Feeling bad or failure about yourself  0  Trouble concentrating 0  Moving slowly or  fidgety/restless 0  Suicidal thoughts 0  PHQ-9 Score 0  Difficult doing work/chores Not difficult at all    Quality of Life: Quality of Life - 01/21/17 1013      Quality of Life Scores   Health/Function Pre  -- declined assessment       Personal Goals: Goals established at orientation with interventions provided to work toward goal. Personal Goals and Risk Factors at Admission - 11/14/16 1321      Core Components/Risk Factors/Patient Goals on Admission    Weight Management  Yes;Weight Loss    Intervention  Weight Management: Develop a combined nutrition and exercise program designed to reach desired caloric intake, while maintaining appropriate intake of nutrient and fiber, sodium and fats, and appropriate energy expenditure required for the weight goal.;Weight Management: Provide education and appropriate resources to help participant work on and attain dietary goals.;Obesity: Provide education and appropriate resources to help participant work on and attain dietary goals.    Admit Weight  186 lb 1.6 oz (84.4 kg)    Goal Weight: Short Term  184 lb (83.5 kg)    Goal Weight: Long Term  180 lb (81.6 kg)    Expected Outcomes  Short Term: Continue to assess and modify interventions until short term weight is achieved;Long Term: Adherence to nutrition and physical activity/exercise program aimed toward attainment of established weight goal    Diabetes  Yes    Intervention  Provide education about signs/symptoms and action to take for hypo/hyperglycemia.;Provide  education about proper nutrition, including hydration, and aerobic/resistive exercise prescription along with prescribed medications to achieve blood glucose in normal ranges: Fasting glucose 65-99 mg/dL    Expected Outcomes  Short Term: Participant verbalizes understanding of the signs/symptoms and immediate care of hyper/hypoglycemia, proper foot care and importance of medication, aerobic/resistive exercise and nutrition plan for blood  glucose control.;Long Term: Attainment of HbA1C < 7%.    Hypertension  Yes    Intervention  Provide education on lifestyle modifcations including regular physical activity/exercise, weight management, moderate sodium restriction and increased consumption of fresh fruit, vegetables, and low fat dairy, alcohol moderation, and smoking cessation.;Monitor prescription use compliance.    Expected Outcomes  Short Term: Continued assessment and intervention until BP is < 140/74m HG in hypertensive participants. < 130/88mHG in hypertensive participants with diabetes, heart failure or chronic kidney disease.;Long Term: Maintenance of blood pressure at goal levels.    Lipids  Yes    Intervention  Provide education and support for participant on nutrition & aerobic/resistive exercise along with prescribed medications to achieve LDL <7075mHDL >89m19m  Expected Outcomes  Short Term: Participant states understanding of desired cholesterol values and is compliant with medications prescribed. Participant is following exercise prescription and nutrition guidelines.;Long Term: Cholesterol controlled with medications as prescribed, with individualized exercise RX and with personalized nutrition plan. Value goals: LDL < 70mg93mL > 40 mg.        Personal Goals Discharge: Goals and Risk Factor Review    Row Name 12/05/16 0904 01/02/17 0910           Core Components/Risk Factors/Patient Goals Review   Personal Goals Review  Weight Management/Obesity;Hypertension;Lipids;Diabetes  Weight Management/Obesity;Hypertension;Lipids;Diabetes      Review  Larry Mann doing well in rehab.  His weight is down some today to 184 lbs.  He is not checking his blood sugars at home but not feel that he is having any swings.  Talked about the importance of keeping his levels stable.  He says that it does go up and down.  His blood pressures have been good in the program.  He does not check it at home, but says he goes to doctor  enough to get it checked.  He is doing well on his medications and not having any problems.   Larry Hedy Camarainues to be steady in his weight around 184lbs.   He has not noticed any swings with his blood sugars.  His meds have been working.  He continues to do well with his blood pressures.        Expected Outcomes  Short: Continue to maintain weight.  Long: Continue to work on risk factor modifications.   Short: Continue to maintain weight and blood pressures.  Long: Continue to monitor risk factor modificaitons.          Exercise Goals and Review: Exercise Goals    Row Name 11/14/16 1551             Exercise Goals   Increase Physical Activity  Yes       Intervention  Provide advice, education, support and counseling about physical activity/exercise needs.;Develop an individualized exercise prescription for aerobic and resistive training based on initial evaluation findings, risk stratification, comorbidities and participant's personal goals.       Expected Outcomes  Achievement of increased cardiorespiratory fitness and enhanced flexibility, muscular endurance and strength shown through measurements of functional capacity and personal statement of participant.       Increase Strength  and Stamina  Yes       Intervention  Provide advice, education, support and counseling about physical activity/exercise needs.;Develop an individualized exercise prescription for aerobic and resistive training based on initial evaluation findings, risk stratification, comorbidities and participant's personal goals.       Expected Outcomes  Achievement of increased cardiorespiratory fitness and enhanced flexibility, muscular endurance and strength shown through measurements of functional capacity and personal statement of participant.       Able to understand and use rate of perceived exertion (RPE) scale  Yes       Intervention  Provide education and explanation on how to use RPE scale       Expected Outcomes  Short  Term: Able to use RPE daily in rehab to express subjective intensity level;Long Term:  Able to use RPE to guide intensity level when exercising independently       Able to understand and use Dyspnea scale  Yes       Intervention  Provide education and explanation on how to use Dyspnea scale       Expected Outcomes  Short Term: Able to use Dyspnea scale daily in rehab to express subjective sense of shortness of breath during exertion;Long Term: Able to use Dyspnea scale to guide intensity level when exercising independently       Knowledge and understanding of Target Heart Rate Range (THRR)  Yes       Intervention  Provide education and explanation of THRR including how the numbers were predicted and where they are located for reference       Expected Outcomes  Short Term: Able to state/look up THRR;Long Term: Able to use THRR to govern intensity when exercising independently;Short Term: Able to use daily as guideline for intensity in rehab       Able to check pulse independently  Yes       Intervention  Provide education and demonstration on how to check pulse in carotid and radial arteries.;Review the importance of being able to check your own pulse for safety during independent exercise       Expected Outcomes  Short Term: Able to explain why pulse checking is important during independent exercise;Long Term: Able to check pulse independently and accurately       Understanding of Exercise Prescription  Yes       Intervention  Provide education, explanation, and written materials on patient's individual exercise prescription       Expected Outcomes  Short Term: Able to explain program exercise prescription;Long Term: Able to explain home exercise prescription to exercise independently          Nutrition & Weight - Outcomes: Pre Biometrics - 11/14/16 1552      Pre Biometrics   Height  5' 5.8" (1.671 m)    Weight  186 lb 1.6 oz (84.4 kg)    Waist Circumference  41 inches    Hip Circumference   40.5 inches    Waist to Hip Ratio  1.01 %    BMI (Calculated)  30.23    Single Leg Stand  7.67 seconds      Post Biometrics - 01/21/17 0906       Post  Biometrics   Height  5' 5.8" (1.671 m)    Weight  188 lb (85.3 kg)    Waist Circumference  40.5 inches    Hip Circumference  42 inches    Waist to Hip Ratio  0.96 %    BMI (Calculated)  30.54    Single Leg Stand  12.14 seconds       Nutrition: Nutrition Therapy & Goals - 12/24/16 1106      Nutrition Therapy   Diet  basic      Personal Nutrition Goals   Personal Goal #2  make small positive diet changes as able and ready.     Comments  Mr. Lenis states he will not be making many, if any, diet changes. Encouraged small changes such as incorporating additional vegetables gradually. Discussed meal and menu options, saturated fat sources, portion control of carbs for blood sugar control.      Intervention Plan   Intervention  Prescribe, educate and counsel regarding individualized specific dietary modifications aiming towards targeted core components such as weight, hypertension, lipid management, diabetes, heart failure and other comorbidities.;Nutrition handout(s) given to patient.    Expected Outcomes  Short Term Goal: Understand basic principles of dietary content, such as calories, fat, sodium, cholesterol and nutrients.;Short Term Goal: A plan has been developed with personal nutrition goals set during dietitian appointment.;Long Term Goal: Adherence to prescribed nutrition plan.       Nutrition Discharge: Nutrition Assessments - 01/21/17 1012      MEDFICTS Scores   Post Score  -- declined assessment       Education Questionnaire Score: Knowledge Questionnaire Score - 01/21/17 1013      Knowledge Questionnaire Score   Pre Score  Declined to complete this questionnaire    Post Score  declined assessment       Goals reviewed with patient; copy given to patient.

## 2017-02-04 NOTE — Patient Instructions (Signed)
Discharge Instructions  Patient Details  Name: Larry Mann MRN: [id number] Date of Birth: 1950-11-03 Referring Provider:  [place] Pink, MD   Number of Visits: 35/36  Reason for Discharge:  Patient reached a stable level of exercise. Patient independent in their exercise. Patient has met program and personal goals.  Smoking History:  Social History   Tobacco Use  Smoking Status Former Smoker  . Last attempt to quit: 05/19/2001  . Years since quitting: 15.7  Smokeless Tobacco Never Used    Diagnosis:  NSTEMI (non-ST elevated myocardial infarction) ([place])  Status post coronary artery stent placement  Initial Exercise Prescription: Initial Exercise Prescription - 11/14/16 1500      Date of Initial Exercise RX and Referring Provider   Date  11/14/16    Referring Provider  Serafina Royals MD      Treadmill   MPH  2.9    Grade  2    Minutes  15    METs  4.02      NuStep   Level  3    SPM  80    Minutes  15    METs  3      REL-XR   Level  2    Speed  50    Minutes  15    METs  3      Prescription Details   Frequency (times per week)  2    Duration  Progress to 45 minutes of aerobic exercise without signs/symptoms of physical distress      Intensity   THRR 40-80% of Max Heartrate  113-142    Ratings of Perceived Exertion  11-13    Perceived Dyspnea  0-4      Progression   Progression  Continue to progress workloads to maintain intensity without signs/symptoms of physical distress.      Resistance Training   Training Prescription  Yes    Weight  3 lbs    Reps  10-15       Discharge Exercise Prescription (Final Exercise Prescription Changes): Exercise Prescription Changes - 01/21/17 1500      Response to Exercise   Blood Pressure (Admit)  124/74    Blood Pressure (Exercise)  134/80    Blood Pressure (Exit)  114/60    Heart Rate (Admit)  94 bpm    Heart Rate (Exercise)  122 bpm    Heart Rate (Exit)  100 bpm    Rating of Perceived Exertion  (Exercise)  12    Symptoms  none    Duration  Continue with 45 min of aerobic exercise without signs/symptoms of physical distress.    Intensity  THRR unchanged      Progression   Progression  Continue to progress workloads to maintain intensity without signs/symptoms of physical distress.    Average METs  4.41      Resistance Training   Training Prescription  Yes    Weight  4 lb    Reps  10-15      Interval Training   Interval Training  No      Treadmill   MPH  3    Grade  3    Minutes  15    METs  4.54      NuStep   Level  3    Minutes  15    METs  3.4      REL-XR   Level  5    Minutes  15    METs  5.3  Home Exercise Plan   Plans to continue exercise at  Home (comment) walking    Frequency  Add 4 additional days to program exercise sessions.    Initial Home Exercises Provided  12/05/16       Functional Capacity: 6 Minute Walk    Row Name 11/14/16 1549 01/21/17 0907       6 Minute Walk   Phase  Initial  Discharge    Distance  1570 feet  1635 feet    Distance % Change  -  4 %    Distance Feet Change  -  65 ft    Walk Time  6 minutes  6 minutes    # of Rest Breaks  0  0    MPH  2.97  3.09    METS  3.94  3.94    RPE  11  9    VO2 Peak  13.78  13.8    Symptoms  No  -    Resting HR  84 bpm  97 bpm    Resting BP  116/64  124/60    Resting Oxygen Saturation   97 %  97 %    Exercise Oxygen Saturation  during 6 min walk  98 %  97 %    Max Ex. HR  84 bpm  125 bpm    Max Ex. BP  146/64  144/66    2 Minute Post BP  128/64  -       Quality of Life: Quality of Life - 01/21/17 1013      Quality of Life Scores   Health/Function Pre  -- declined assessment       Personal Goals: Goals established at orientation with interventions provided to work toward goal. Personal Goals and Risk Factors at Admission - 11/14/16 1321      Core Components/Risk Factors/Patient Goals on Admission    Weight Management  Yes;Weight Loss    Intervention  Weight  Management: Develop a combined nutrition and exercise program designed to reach desired caloric intake, while maintaining appropriate intake of nutrient and fiber, sodium and fats, and appropriate energy expenditure required for the weight goal.;Weight Management: Provide education and appropriate resources to help participant work on and attain dietary goals.;Obesity: Provide education and appropriate resources to help participant work on and attain dietary goals.    Admit Weight  186 lb 1.6 oz (84.4 kg)    Goal Weight: Short Term  184 lb (83.5 kg)    Goal Weight: Long Term  180 lb (81.6 kg)    Expected Outcomes  Short Term: Continue to assess and modify interventions until short term weight is achieved;Long Term: Adherence to nutrition and physical activity/exercise program aimed toward attainment of established weight goal    Diabetes  Yes    Intervention  Provide education about signs/symptoms and action to take for hypo/hyperglycemia.;Provide education about proper nutrition, including hydration, and aerobic/resistive exercise prescription along with prescribed medications to achieve blood glucose in normal ranges: Fasting glucose 65-99 mg/dL    Expected Outcomes  Short Term: Participant verbalizes understanding of the signs/symptoms and immediate care of hyper/hypoglycemia, proper foot care and importance of medication, aerobic/resistive exercise and nutrition plan for blood glucose control.;Long Term: Attainment of HbA1C < 7%.    Hypertension  Yes    Intervention  Provide education on lifestyle modifcations including regular physical activity/exercise, weight management, moderate sodium restriction and increased consumption of fresh fruit, vegetables, and low fat dairy, alcohol moderation, and smoking cessation.;Monitor prescription  use compliance.    Expected Outcomes  Short Term: Continued assessment and intervention until BP is < 140/80m HG in hypertensive participants. < 130/812mHG in  hypertensive participants with diabetes, heart failure or chronic kidney disease.;Long Term: Maintenance of blood pressure at goal levels.    Lipids  Yes    Intervention  Provide education and support for participant on nutrition & aerobic/resistive exercise along with prescribed medications to achieve LDL <7061mHDL >95m75m  Expected Outcomes  Short Term: Participant states understanding of desired cholesterol values and is compliant with medications prescribed. Participant is following exercise prescription and nutrition guidelines.;Long Term: Cholesterol controlled with medications as prescribed, with individualized exercise RX and with personalized nutrition plan. Value goals: LDL < 70mg30mL > 40 mg.        Personal Goals Discharge: Goals and Risk Factor Review - 01/02/17 0910      Core Components/Risk Factors/Patient Goals Review   Personal Goals Review  Weight Management/Obesity;Hypertension;Lipids;Diabetes    Review  [name] Hedy Camarainues to be steady in his weight around 184lbs.   He has not noticed any swings with his blood sugars.  His meds have been working.  He continues to do well with his blood pressures.      Expected Outcomes  Short: Continue to maintain weight and blood pressures.  Long: Continue to monitor risk factor modificaitons.        Exercise Goals and Review: Exercise Goals    Row Name 11/14/16 1551             Exercise Goals   Increase Physical Activity  Yes       Intervention  Provide advice, education, support and counseling about physical activity/exercise needs.;Develop an individualized exercise prescription for aerobic and resistive training based on initial evaluation findings, risk stratification, comorbidities and participant's personal goals.       Expected Outcomes  Achievement of increased cardiorespiratory fitness and enhanced flexibility, muscular endurance and strength shown through measurements of functional capacity and personal statement of  participant.       Increase Strength and Stamina  Yes       Intervention  Provide advice, education, support and counseling about physical activity/exercise needs.;Develop an individualized exercise prescription for aerobic and resistive training based on initial evaluation findings, risk stratification, comorbidities and participant's personal goals.       Expected Outcomes  Achievement of increased cardiorespiratory fitness and enhanced flexibility, muscular endurance and strength shown through measurements of functional capacity and personal statement of participant.       Able to understand and use rate of perceived exertion (RPE) scale  Yes       Intervention  Provide education and explanation on how to use RPE scale       Expected Outcomes  Short Term: Able to use RPE daily in rehab to express subjective intensity level;Long Term:  Able to use RPE to guide intensity level when exercising independently       Able to understand and use Dyspnea scale  Yes       Intervention  Provide education and explanation on how to use Dyspnea scale       Expected Outcomes  Short Term: Able to use Dyspnea scale daily in rehab to express subjective sense of shortness of breath during exertion;Long Term: Able to use Dyspnea scale to guide intensity level when exercising independently       Knowledge and understanding of Target Heart Rate Range (THRR)  Yes  Intervention  Provide education and explanation of THRR including how the numbers were predicted and where they are located for reference       Expected Outcomes  Short Term: Able to state/look up THRR;Long Term: Able to use THRR to govern intensity when exercising independently;Short Term: Able to use daily as guideline for intensity in rehab       Able to check pulse independently  Yes       Intervention  Provide education and demonstration on how to check pulse in carotid and radial arteries.;Review the importance of being able to check your own pulse for  safety during independent exercise       Expected Outcomes  Short Term: Able to explain why pulse checking is important during independent exercise;Long Term: Able to check pulse independently and accurately       Understanding of Exercise Prescription  Yes       Intervention  Provide education, explanation, and written materials on patient's individual exercise prescription       Expected Outcomes  Short Term: Able to explain program exercise prescription;Long Term: Able to explain home exercise prescription to exercise independently          Nutrition & Weight - Outcomes: Pre Biometrics - 11/14/16 1552      Pre Biometrics   Height  5' 5.8" (1.671 m)    Weight  186 lb 1.6 oz (84.4 kg)    Waist Circumference  41 inches    Hip Circumference  40.5 inches    Waist to Hip Ratio  1.01 %    BMI (Calculated)  30.23    Single Leg Stand  7.67 seconds      Post Biometrics - 01/21/17 0906       Post  Biometrics   Height  5' 5.8" (1.671 m)    Weight  188 lb (85.3 kg)    Waist Circumference  40.5 inches    Hip Circumference  42 inches    Waist to Hip Ratio  0.96 %    BMI (Calculated)  30.54    Single Leg Stand  12.14 seconds       Nutrition: Nutrition Therapy & Goals - 12/24/16 1106      Nutrition Therapy   Diet  basic      Personal Nutrition Goals   Personal Goal #2  make small positive diet changes as able and ready.     Comments  Mr. Silas states he will not be making many, if any, diet changes. Encouraged small changes such as incorporating additional vegetables gradually. Discussed meal and menu options, saturated fat sources, portion control of carbs for blood sugar control.      Intervention Plan   Intervention  Prescribe, educate and counsel regarding individualized specific dietary modifications aiming towards targeted core components such as weight, hypertension, lipid management, diabetes, heart failure and other comorbidities.;Nutrition handout(s) given to patient.     Expected Outcomes  Short Term Goal: Understand basic principles of dietary content, such as calories, fat, sodium, cholesterol and nutrients.;Short Term Goal: A plan has been developed with personal nutrition goals set during dietitian appointment.;Long Term Goal: Adherence to prescribed nutrition plan.       Nutrition Discharge: Nutrition Assessments - 01/21/17 1012      MEDFICTS Scores   Post Score  -- declined assessment       Education Questionnaire Score: Knowledge Questionnaire Score - 01/21/17 1013      Knowledge Questionnaire Score   Pre Score  Declined  to complete this questionnaire    Post Score  declined assessment       Goals reviewed with patient; copy given to patient.

## 2017-02-04 NOTE — Progress Notes (Signed)
Daily Session Note  Patient Details  Name: Larry Mann MRN: [id number] Date of Birth: 06-12-1950 Referring Provider:     Cardiac Rehab from 11/14/2016 in [facility]  Referring Provider  Serafina Royals MD      Encounter Date: 02/04/2017  Check In: Session Check In - 02/04/17 0825      Check-In   Location  [facility]-Cardiac [facility] Pulmonary Rehab    Staff Present  Nada Maclachlan, BA, ACSM CEP, Exercise Physiologist;Joseph Christain [place], RN BSN;Adekunle Rohrbach Luan Pulling, [place], ACSM RCEP, Exercise Physiologist    Supervising physician immediately available to respond to emergencies  See telemetry face sheet for immediately available ER MD    Medication changes reported      No    Fall or balance concerns reported     No    Warm-up and Cool-down  Performed on first and last piece of equipment    Resistance Training Performed  Yes    VAD Patient?  No      Pain Assessment   Currently in Pain?  No/denies          Social History   Tobacco Use  Smoking Status Former Smoker  . Last attempt to quit: 05/19/2001  . Years since quitting: 15.7  Smokeless Tobacco Never Used    Goals Met:  Independence with exercise equipment Exercise tolerated well No report of cardiac concerns or symptoms Strength training completed today  Goals Unmet:  Not Applicable  Comments:  [name] graduated today from cardiac rehab with 35 sessions completed.  Details of the patient's exercise prescription and what He needs to do in order to continue the prescription and progress were discussed with patient.  Patient was given a copy of prescription and goals.  Patient verbalized understanding.  [name] plans to continue to exercise by walking at home.    Dr. Emily Filbert is Medical Director for [place] [facility] and [facility].

## 2017-04-23 ENCOUNTER — Ambulatory Visit: Payer: Medicare Other | Admitting: Anesthesiology

## 2017-04-23 ENCOUNTER — Ambulatory Visit
Admission: RE | Admit: 2017-04-23 | Discharge: 2017-04-23 | Disposition: A | Discharge status: Home / Self Care | Payer: Medicare Other | Source: Ambulatory Visit | Attending: Internal Medicine | Admitting: Internal Medicine

## 2017-04-23 ENCOUNTER — Encounter
Admission: RE | Disposition: A | Discharge status: Home / Self Care | Payer: Self-pay | Source: Ambulatory Visit | Attending: Internal Medicine

## 2017-04-23 ENCOUNTER — Other Ambulatory Visit: Payer: Self-pay

## 2017-04-23 ENCOUNTER — Encounter: Payer: Self-pay | Admitting: *Deleted

## 2017-04-23 DIAGNOSIS — E1122 Type 2 diabetes mellitus with diabetic chronic kidney disease: Secondary | ICD-10-CM | POA: Diagnosis not present

## 2017-04-23 DIAGNOSIS — Z7982 Long term (current) use of aspirin: Secondary | ICD-10-CM | POA: Diagnosis not present

## 2017-04-23 DIAGNOSIS — Z85828 Personal history of other malignant neoplasm of skin: Secondary | ICD-10-CM | POA: Insufficient documentation

## 2017-04-23 DIAGNOSIS — I252 Old myocardial infarction: Secondary | ICD-10-CM | POA: Insufficient documentation

## 2017-04-23 DIAGNOSIS — E119 Type 2 diabetes mellitus without complications: Secondary | ICD-10-CM | POA: Insufficient documentation

## 2017-04-23 DIAGNOSIS — Z87891 Personal history of nicotine dependence: Secondary | ICD-10-CM | POA: Insufficient documentation

## 2017-04-23 DIAGNOSIS — N189 Chronic kidney disease, unspecified: Secondary | ICD-10-CM | POA: Insufficient documentation

## 2017-04-23 DIAGNOSIS — I4891 Unspecified atrial fibrillation: Secondary | ICD-10-CM | POA: Insufficient documentation

## 2017-04-23 DIAGNOSIS — Z79899 Other long term (current) drug therapy: Secondary | ICD-10-CM | POA: Diagnosis not present

## 2017-04-23 DIAGNOSIS — Z7901 Long term (current) use of anticoagulants: Secondary | ICD-10-CM | POA: Diagnosis not present

## 2017-04-23 DIAGNOSIS — R0602 Shortness of breath: Secondary | ICD-10-CM | POA: Diagnosis present

## 2017-04-23 DIAGNOSIS — I129 Hypertensive chronic kidney disease with stage 1 through stage 4 chronic kidney disease, or unspecified chronic kidney disease: Secondary | ICD-10-CM | POA: Diagnosis not present

## 2017-04-23 DIAGNOSIS — I4892 Unspecified atrial flutter: Secondary | ICD-10-CM | POA: Diagnosis not present

## 2017-04-23 DIAGNOSIS — Z955 Presence of coronary angioplasty implant and graft: Secondary | ICD-10-CM | POA: Diagnosis not present

## 2017-04-23 DIAGNOSIS — Z7984 Long term (current) use of oral hypoglycemic drugs: Secondary | ICD-10-CM | POA: Insufficient documentation

## 2017-04-23 DIAGNOSIS — I2510 Atherosclerotic heart disease of native coronary artery without angina pectoris: Secondary | ICD-10-CM | POA: Diagnosis not present

## 2017-04-23 HISTORY — DX: Malignant neoplasm of bladder, unspecified: C67.9

## 2017-04-23 HISTORY — PX: CARDIOVERSION: EP1203

## 2017-04-23 LAB — GLUCOSE, CAPILLARY: GLUCOSE-CAPILLARY: 128 mg/dL — AB (ref 65–99)

## 2017-04-23 SURGERY — CARDIOVERSION (CATH LAB)
Anesthesia: General

## 2017-04-23 MED ORDER — PROPOFOL 10 MG/ML IV BOLUS
INTRAVENOUS | Status: DC | PRN
  Administered 2017-04-23: 70 mg via INTRAVENOUS

## 2017-04-23 MED ORDER — SODIUM CHLORIDE 0.9 % IV SOLN
INTRAVENOUS | Status: DC
  Administered 2017-04-23: 07:00:00 via INTRAVENOUS

## 2017-04-23 MED ORDER — PROPOFOL 10 MG/ML IV BOLUS
INTRAVENOUS | Status: AC
  Filled 2017-04-23: qty 40

## 2017-04-23 NOTE — Discharge Instructions (Signed)
Electrical Cardioversion, Care After °This sheet gives you information about how to care for yourself after your procedure. Your health care provider may also give you more specific instructions. If you have problems or questions, contact your health care provider. °What can I expect after the procedure? °After the procedure, it is common to have: °· Some redness on the skin where the shocks were given. ° °Follow these instructions at home: °· Do not drive for 24 hours if you were given a medicine to help you relax (sedative). °· Take over-the-counter and prescription medicines only as told by your health care provider. °· Ask your health care provider how to check your pulse. Check it often. °· Rest for 48 hours after the procedure or as told by your health care provider. °· Avoid or limit your caffeine use as told by your health care provider. °Contact a health care provider if: °· You feel like your heart is beating too quickly or your pulse is not regular. °· You have a serious muscle cramp that does not go away. °Get help right away if: °· You have discomfort in your chest. °· You are dizzy or you feel faint. °· You have trouble breathing or you are short of breath. °· Your speech is slurred. °· You have trouble moving an arm or leg on one side of your body. °· Your fingers or toes turn cold or blue. °This information is not intended to replace advice given to you by your health care provider. Make sure you discuss any questions you have with your health care provider. °Document Released: 12/16/2012 Document Revised: 09/29/2015 Document Reviewed: 09/01/2015 °Elsevier Interactive Patient Education © 2018 [employer] ° °

## 2017-04-23 NOTE — Anesthesia Post-op Follow-up Note (Signed)
Anesthesia QCDR form completed.        

## 2017-04-23 NOTE — Anesthesia Postprocedure Evaluation (Signed)
Anesthesia Post Note  Patient: Larry Mann  Procedure(s) Performed: CARDIOVERSION (N/A )  Patient location: specials recovery. Anesthesia Type: General Level of consciousness: awake and alert and oriented Pain management: pain level controlled Vital Signs Assessment: post-procedure vital signs reviewed and stable Respiratory status: spontaneous breathing, nonlabored ventilation and respiratory function stable Cardiovascular status: blood pressure returned to baseline and stable Postop Assessment: no signs of nausea or vomiting Anesthetic complications: no     Last Vitals:  Vitals:   04/23/17 0740 04/23/17 0741  BP:  118/68  Pulse: 79 99  Resp: 18   SpO2: 91%     Last Pain: There were no vitals filed for this visit.               Betina Puckett

## 2017-04-23 NOTE — Transfer of Care (Signed)
Immediate Anesthesia Transfer of Care Note  Patient: Larry Mann  Procedure(s) Performed: CARDIOVERSION (N/A )  Patient Location: Short Stay  Anesthesia Type:General  Level of Consciousness: awake, alert  and oriented  Airway & Oxygen Therapy: Patient connected to nasal cannula oxygen  Post-op Assessment: Post -op Vital signs reviewed and stable  Post vital signs: stable  Last Vitals:  Vitals:   04/23/17 0740 04/23/17 0741  BP:  118/68  Pulse: 79 99  Resp: 18   SpO2: 91%     Last Pain: There were no vitals filed for this visit.       Complications: No apparent anesthesia complications

## 2017-04-23 NOTE — CV Procedure (Signed)
Electrical Cardioversion Procedure Note Larry Mann [id number] 1950-11-06  Procedure: Electrical Cardioversion Indications:  Atrial Flutter  Procedure Details Consent: Risks of procedure as well as the alternatives and risks of each were explained to the (patient/caregiver).  Consent for procedure obtained. Time Out: Verified patient identification, verified procedure, site/side was marked, verified correct patient position, special equipment/implants available, medications/allergies/relevent history reviewed, required imaging and test results available.  Performed  Patient placed on cardiac monitor, pulse oximetry, supplemental oxygen as necessary.  Sedation given: Short-acting barbiturates Pacer pads placed anterior and posterior chest.  Cardioverted 1 time(s).  Cardioverted at 120J.  Evaluation Findings: Post procedure EKG shows: NSR Complications: None Patient did tolerate procedure well.   Corey Skains 04/23/2017, 7:42 AM

## 2017-04-23 NOTE — Anesthesia Preprocedure Evaluation (Signed)
Anesthesia Evaluation  Patient identified by MRN, date of birth, ID band Patient awake    Reviewed: Allergy & Precautions, NPO status , Patient's Chart, lab work & pertinent test results  History of Anesthesia Complications Negative for: history of anesthetic complications  Airway Mallampati: II  TM Distance: >3 FB Neck ROM: Full    Dental no notable dental hx.    Pulmonary neg sleep apnea, neg COPD, former smoker,    breath sounds clear to auscultation- rhonchi (-) wheezing      Cardiovascular hypertension, Pt. on medications + CAD, + Past MI and + Cardiac Stents (last stent August 2018)  (-) CABG + dysrhythmias Atrial Fibrillation  Rhythm:Regular Rate:Normal - Systolic murmurs and - Diastolic murmurs    Neuro/Psych  Headaches, negative psych ROS   GI/Hepatic negative GI ROS, Neg liver ROS,   Endo/Other  diabetes, Oral Hypoglycemic Agents  Renal/GU Renal disease: hx of nephrolithiasis.     Musculoskeletal negative musculoskeletal ROS (+)   Abdominal (+) - obese,   Peds  Hematology negative hematology ROS (+)   Anesthesia Other Findings Past Medical History: No date: Anginal pain (HCC) No date: Basal cell carcinoma No date: Bladder cancer (HCC) No date: Cancer ([place]) No date: Chicken pox No date: Chronic kidney disease No date: Coronary artery disease No date: Diabetes mellitus without complication (HCC) No date: Headache No date: Hypertension No date: Myocardial infarction ([place]) No date: Plantar fasciitis   Reproductive/Obstetrics                             Anesthesia Physical Anesthesia Plan  ASA: III  Anesthesia Plan: General   Post-op Pain Management:    Induction: Intravenous  PONV Risk Score and Plan: 1 and Propofol infusion  Airway Management Planned: Natural Airway  Additional Equipment:   Intra-op Plan:   Post-operative Plan:   Informed Consent: I have  reviewed the patients History and Physical, chart, labs and discussed the procedure including the risks, benefits and alternatives for the proposed anesthesia with the patient or authorized representative who has indicated his/her understanding and acceptance.   Dental advisory given  Plan Discussed with: CRNA and Anesthesiologist  Anesthesia Plan Comments:         Anesthesia Quick Evaluation

## 2018-03-19 ENCOUNTER — Encounter: Payer: Self-pay | Admitting: Certified Registered Nurse Anesthetist

## 2018-03-19 ENCOUNTER — Ambulatory Visit: Admission: RE | Admit: 2018-03-19 | Payer: Medicare Other | Source: Ambulatory Visit | Admitting: Internal Medicine

## 2018-03-19 ENCOUNTER — Encounter: Admission: RE | Payer: Self-pay | Source: Ambulatory Visit

## 2018-03-19 SURGERY — CARDIOVERSION (CATH LAB)
Anesthesia: General

## 2018-03-19 MED ORDER — SODIUM CHLORIDE 0.9 % IV SOLN: INTRAVENOUS | Status: DC

## 2018-03-19 NOTE — Progress Notes (Signed)
No orders for patient's procedure.  Paged dr. Nehemiah Massed and sent epic  message  Pt. Here in waiting room

## 2018-03-19 NOTE — Progress Notes (Signed)
Patient requests to leave and reschedule procedure.

## 2018-03-19 NOTE — Progress Notes (Signed)
Paged dr. Nehemiah Massed again for cardioversion orders.  Patient requests to leave and reschedule procedure.

## 2018-03-27 ENCOUNTER — Ambulatory Visit
Admission: RE | Admit: 2018-03-27 | Discharge: 2018-03-27 | Disposition: A | Discharge status: Home / Self Care | Payer: Medicare Other | Source: Ambulatory Visit | Attending: Internal Medicine | Admitting: Internal Medicine

## 2018-03-27 ENCOUNTER — Ambulatory Visit: Payer: Medicare Other | Admitting: Certified Registered Nurse Anesthetist

## 2018-03-27 ENCOUNTER — Other Ambulatory Visit: Payer: Self-pay

## 2018-03-27 ENCOUNTER — Encounter
Admission: RE | Disposition: A | Discharge status: Home / Self Care | Payer: Self-pay | Source: Ambulatory Visit | Attending: Internal Medicine

## 2018-03-27 DIAGNOSIS — Z7984 Long term (current) use of oral hypoglycemic drugs: Secondary | ICD-10-CM | POA: Insufficient documentation

## 2018-03-27 DIAGNOSIS — Z955 Presence of coronary angioplasty implant and graft: Secondary | ICD-10-CM | POA: Insufficient documentation

## 2018-03-27 DIAGNOSIS — I252 Old myocardial infarction: Secondary | ICD-10-CM | POA: Diagnosis not present

## 2018-03-27 DIAGNOSIS — I4892 Unspecified atrial flutter: Secondary | ICD-10-CM | POA: Diagnosis not present

## 2018-03-27 DIAGNOSIS — E119 Type 2 diabetes mellitus without complications: Secondary | ICD-10-CM | POA: Diagnosis not present

## 2018-03-27 DIAGNOSIS — Z85828 Personal history of other malignant neoplasm of skin: Secondary | ICD-10-CM | POA: Insufficient documentation

## 2018-03-27 DIAGNOSIS — I10 Essential (primary) hypertension: Secondary | ICD-10-CM | POA: Insufficient documentation

## 2018-03-27 DIAGNOSIS — Z87891 Personal history of nicotine dependence: Secondary | ICD-10-CM | POA: Diagnosis not present

## 2018-03-27 DIAGNOSIS — I2510 Atherosclerotic heart disease of native coronary artery without angina pectoris: Secondary | ICD-10-CM | POA: Insufficient documentation

## 2018-03-27 DIAGNOSIS — Z8551 Personal history of malignant neoplasm of bladder: Secondary | ICD-10-CM | POA: Diagnosis not present

## 2018-03-27 HISTORY — PX: CARDIOVERSION: EP1203

## 2018-03-27 LAB — GLUCOSE, CAPILLARY: GLUCOSE-CAPILLARY: 182 mg/dL — AB (ref 70–99)

## 2018-03-27 SURGERY — CARDIOVERSION (CATH LAB)
Anesthesia: General

## 2018-03-27 MED ORDER — PROPOFOL 10 MG/ML IV BOLUS
INTRAVENOUS | Status: DC | PRN
  Administered 2018-03-27: 70 mg via INTRAVENOUS

## 2018-03-27 MED ORDER — SODIUM CHLORIDE 0.9 % IV SOLN
INTRAVENOUS | Status: DC
  Administered 2018-03-27: 07:00:00 via INTRAVENOUS

## 2018-03-27 MED ORDER — PHENYLEPHRINE HCL 10 MG/ML IJ SOLN
INTRAMUSCULAR | Status: AC
  Filled 2018-03-27: qty 1

## 2018-03-27 MED ORDER — PROPOFOL 10 MG/ML IV BOLUS
INTRAVENOUS | Status: AC
  Filled 2018-03-27: qty 20

## 2018-03-27 NOTE — CV Procedure (Signed)
Electrical Cardioversion Procedure Note [facility]ICK [facility] [id number] 1950-03-22  Procedure: Electrical Cardioversion Indications:  Atrial Flutter  Procedure Details Consent: Risks of procedure as well as the alternatives and risks of each were explained to the (patient/caregiver).  Consent for procedure obtained. Time Out: Verified patient identification, verified procedure, site/side was marked, verified correct patient position, special equipment/implants available, medications/allergies/relevent history reviewed, required imaging and test results available.  Performed  Patient placed on cardiac monitor, pulse oximetry, supplemental oxygen as necessary.  Sedation given: Short-acting barbiturates Pacer pads placed anterior and posterior chest.  Cardioverted 1 time(s).  Cardioverted at 120J.  Evaluation Findings: Post procedure EKG shows: NSR Complications: None Patient did tolerate procedure well.   Corey Skains 03/27/2018, 7:49 AM

## 2018-03-27 NOTE — Anesthesia Procedure Notes (Signed)
Date/Time: 03/27/2018 7:30 AM Performed by: Johnna Acosta, CRNA Pre-anesthesia Checklist: Patient identified, Emergency Drugs available, Suction available, Patient being monitored and Timeout performed Patient Re-evaluated:Patient Re-evaluated prior to induction Oxygen Delivery Method: Nasal cannula Preoxygenation: Pre-oxygenation with 100% oxygen Induction Type: IV induction

## 2018-03-27 NOTE — Anesthesia Postprocedure Evaluation (Signed)
Anesthesia Post Note  Patient: Larry Mann  Procedure(s) Performed: CARDIOVERSION (CATH LAB) (N/A )  Patient location during evaluation: Other (specials recovery) Anesthesia Type: General Level of consciousness: awake and alert and oriented Pain management: pain level controlled Vital Signs Assessment: post-procedure vital signs reviewed and stable Respiratory status: spontaneous breathing, nonlabored ventilation and respiratory function stable Cardiovascular status: blood pressure returned to baseline and stable Postop Assessment: no signs of nausea or vomiting Anesthetic complications: no     Last Vitals:  Vitals:   03/27/18 0800 03/27/18 0815  BP: (!) 139/97 137/85  Pulse: 90 88  Resp: 16 15  Temp:    SpO2: 92% 93%    Last Pain:  Vitals:   03/27/18 0815  TempSrc:   PainSc: 0-No pain                 Jesiel Garate

## 2018-03-27 NOTE — Anesthesia Post-op Follow-up Note (Signed)
Anesthesia QCDR form completed.        

## 2018-03-27 NOTE — Transfer of Care (Signed)
Immediate Anesthesia Transfer of Care Note  Patient: Larry Mann  Procedure(s) Performed: CARDIOVERSION (CATH LAB) (N/A )  Patient Location: PACU  Anesthesia Type:General  Level of Consciousness: drowsy  Airway & Oxygen Therapy: Patient Spontanous Breathing and Patient connected to nasal cannula oxygen  Post-op Assessment: Report given to RN and Post -op Vital signs reviewed and stable  Post vital signs: Reviewed and stable  Last Vitals:  Vitals Value Taken Time  BP 132/94 03/27/2018  7:40 AM  Temp 36.5 C 03/27/2018  7:40 AM  Pulse 95 03/27/2018  7:41 AM  Resp 17 03/27/2018  7:41 AM  SpO2 96 % 03/27/2018  7:41 AM  Vitals shown include unvalidated device data.  Last Pain:  Vitals:   03/27/18 0740  TempSrc: Tympanic  PainSc:          Complications: No apparent anesthesia complications

## 2018-03-27 NOTE — Discharge Instructions (Signed)
Electrical Cardioversion, Care After °This sheet gives you information about how to care for yourself after your procedure. Your health care provider may also give you more specific instructions. If you have problems or questions, contact your health care provider. °What can I expect after the procedure? °After the procedure, it is common to have: °· Some redness on the skin where the shocks were given. °Follow these instructions at home: ° °· Do not drive for 24 hours if you were given a medicine to help you relax (sedative). °· Take over-the-counter and prescription medicines only as told by your health care provider. °· Ask your health care provider how to check your pulse. Check it often. °· Rest for 48 hours after the procedure or as told by your health care provider. °· Avoid or limit your caffeine use as told by your health care provider. °Contact a health care provider if: °· You feel like your heart is beating too quickly or your pulse is not regular. °· You have a serious muscle cramp that does not go away. °Get help right away if: ° °· You have discomfort in your chest. °· You are dizzy or you feel faint. °· You have trouble breathing or you are short of breath. °· Your speech is slurred. °· You have trouble moving an arm or leg on one side of your body. °· Your fingers or toes turn cold or blue. °This information is not intended to replace advice given to you by your health care provider. Make sure you discuss any questions you have with your health care provider. °Document Released: 12/16/2012 Document Revised: 09/29/2015 Document Reviewed: 09/01/2015 °Elsevier Interactive Patient Education © 2019 [employer] ° °

## 2018-03-27 NOTE — Anesthesia Preprocedure Evaluation (Signed)
Anesthesia Evaluation  Patient identified by MRN, date of birth, ID band Patient awake    Reviewed: Allergy & Precautions, NPO status , Patient's Chart, lab work & pertinent test results  History of Anesthesia Complications Negative for: history of anesthetic complications  Airway Mallampati: II  TM Distance: >3 FB Neck ROM: Full    Dental no notable dental hx.    Pulmonary neg sleep apnea, neg COPD, former smoker,    breath sounds clear to auscultation- rhonchi (-) wheezing      Cardiovascular hypertension, Pt. on medications (-) angina+ CAD, + Past MI and + Cardiac Stents (last stent August 2018)  (-) CABG + dysrhythmias Atrial Fibrillation  Rhythm:Irregular Rate:Normal - Systolic murmurs and - Diastolic murmurs    Neuro/Psych  Headaches, negative psych ROS   GI/Hepatic negative GI ROS, Neg liver ROS,   Endo/Other  diabetes, Oral Hypoglycemic Agents  Renal/GU Renal disease: hx of nephrolithiasis.     Musculoskeletal negative musculoskeletal ROS (+)   Abdominal (+) + obese,   Peds  Hematology negative hematology ROS (+)   Anesthesia Other Findings Past Medical History: No date: Anginal pain (HCC) No date: Basal cell carcinoma No date: Bladder cancer (HCC) No date: Cancer ([place]) No date: Chicken pox No date: Chronic kidney disease No date: Coronary artery disease No date: Diabetes mellitus without complication (HCC) No date: Headache No date: Hypertension No date: Myocardial infarction ([place]) No date: Plantar fasciitis    Reproductive/Obstetrics                             Anesthesia Physical  Anesthesia Plan  ASA: III  Anesthesia Plan: General   Post-op Pain Management:    Induction: Intravenous  PONV Risk Score and Plan: 1 and Propofol infusion  Airway Management Planned: Natural Airway  Additional Equipment:   Intra-op Plan:   Post-operative Plan:   Informed  Consent: I have reviewed the patients History and Physical, chart, labs and discussed the procedure including the risks, benefits and alternatives for the proposed anesthesia with the patient or authorized representative who has indicated his/her understanding and acceptance.     Dental advisory given  Plan Discussed with: CRNA and Anesthesiologist  Anesthesia Plan Comments:         Anesthesia Quick Evaluation

## 2018-05-31 MED ORDER — BUPIVACAINE HCL (PF) 0.5 % IJ SOLN
INTRAMUSCULAR | Status: AC
  Filled 2018-05-31: qty 30

## 2018-05-31 MED ORDER — LIDOCAINE HCL (PF) 1 % IJ SOLN
INTRAMUSCULAR | Status: AC
  Filled 2018-05-31: qty 30

## 2018-06-07 MED ORDER — BUPIVACAINE-EPINEPHRINE (PF) 0.25% -1:200000 IJ SOLN
INTRAMUSCULAR | Status: AC
  Filled 2018-06-07: qty 30

## 2018-06-07 MED ORDER — BUPIVACAINE LIPOSOME 1.3 % IJ SUSP
INTRAMUSCULAR | Status: AC
  Filled 2018-06-07: qty 20

## 2019-08-23 ENCOUNTER — Other Ambulatory Visit: Payer: Self-pay

## 2019-08-23 ENCOUNTER — Ambulatory Visit (INDEPENDENT_AMBULATORY_CARE_PROVIDER_SITE_OTHER): Payer: Medicare Other | Admitting: Dermatology

## 2019-08-23 DIAGNOSIS — D692 Other nonthrombocytopenic purpura: Secondary | ICD-10-CM

## 2019-08-23 DIAGNOSIS — L578 Other skin changes due to chronic exposure to nonionizing radiation: Secondary | ICD-10-CM

## 2019-08-23 DIAGNOSIS — Z1283 Encounter for screening for malignant neoplasm of skin: Secondary | ICD-10-CM

## 2019-08-23 DIAGNOSIS — Z85828 Personal history of other malignant neoplasm of skin: Secondary | ICD-10-CM | POA: Diagnosis not present

## 2019-08-23 DIAGNOSIS — L719 Rosacea, unspecified: Secondary | ICD-10-CM | POA: Diagnosis not present

## 2019-08-23 DIAGNOSIS — L209 Atopic dermatitis, unspecified: Secondary | ICD-10-CM | POA: Diagnosis not present

## 2019-08-23 DIAGNOSIS — L821 Other seborrheic keratosis: Secondary | ICD-10-CM

## 2019-08-23 DIAGNOSIS — L814 Other melanin hyperpigmentation: Secondary | ICD-10-CM

## 2019-08-23 DIAGNOSIS — D1801 Hemangioma of skin and subcutaneous tissue: Secondary | ICD-10-CM

## 2019-08-23 DIAGNOSIS — D229 Melanocytic nevi, unspecified: Secondary | ICD-10-CM

## 2019-08-23 DIAGNOSIS — I872 Venous insufficiency (chronic) (peripheral): Secondary | ICD-10-CM

## 2019-08-23 NOTE — Progress Notes (Signed)
   Follow-Up Visit   Subjective  Larry Mann is a 69 y.o. male who presents for the following: Annual Exam (Hx of BCC on the nasal tip - no new or changing moles, lesions, or spots). The patient presents for Total-Body Skin Exam (TBSE) for skin cancer screening and mole check.  The following portions of the chart were reviewed this encounter and updated as appropriate:  Tobacco  Allergies  Meds  Problems  Med Hx  Surg Hx  Fam Hx     Review of Systems:  No other skin or systemic complaints except as noted in HPI or Assessment and Plan.  Objective  Well appearing patient in no apparent distress; mood and affect are within normal limits.  A full examination was performed including scalp, head, eyes, ears, nose, lips, neck, chest, axillae, abdomen, back, buttocks, bilateral upper extremities, bilateral lower extremities, hands, feet, fingers, toes, fingernails, and toenails. All findings within normal limits unless otherwise noted below.  Objective  Face: Dilated vessels on the face   Objective  back: Clear   Objective  B/L leg: Erythematous, scaly patches involving the ankle and distal lower leg with associated lower leg edema.    Assessment & Plan    Rosacea Face  Benign, observe.   Atopic dermatitis Back Improving. 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   Lentigines - Scattered tan macules - Discussed due to sun exposure - Benign, observe - Call for any changes  Seborrheic Keratoses - Stuck-on, waxy, tan-brown papules and plaques  - Discussed benign etiology and prognosis. - Observe - Call for any changes  Melanocytic Nevi - Tan-brown and/or pink-flesh-colored symmetric macules and papules - Benign appearing on exam today - Observation - Call clinic for new or changing moles - Recommend daily use of broad spectrum spf 30+ sunscreen to  sun-exposed areas.   Hemangiomas - Red papules - Discussed benign nature - Observe - Call for any changes  Actinic Damage - diffuse scaly erythematous macules with underlying dyspigmentation - Recommend daily broad spectrum sunscreen SPF 30+ to sun-exposed areas, reapply every 2 hours as needed.  - Call for new or changing lesions.  Purpura - Violaceous macules and patches - Benign - Related to age, sun damage and/or use of blood thinners - Observe - Can use OTC arnica containing moisturizer such as Dermend Bruise Formula if desired - Call for worsening or other concerns  History of Basal Cell Carcinoma of the Skin - No evidence of recurrence today - Recommend regular full body skin exams - Recommend daily broad spectrum sunscreen SPF 30+ to sun-exposed areas, reapply every 2 hours as needed.  - Call if any new or changing lesions are noted between office visits   Skin cancer screening performed today.   Return in about 1 year (around 08/22/2020) for TBSE.  Luther Redo, CMA, am acting as scribe for [name], MD .  Documentation: I have reviewed the above documentation for accuracy and completeness, and I agree with the above.  [name], MD

## 2019-08-26 ENCOUNTER — Encounter: Payer: Self-pay | Admitting: Dermatology

## 2019-09-28 ENCOUNTER — Other Ambulatory Visit: Payer: Self-pay

## 2019-09-28 ENCOUNTER — Encounter (INDEPENDENT_AMBULATORY_CARE_PROVIDER_SITE_OTHER): Payer: Self-pay

## 2019-09-28 ENCOUNTER — Encounter: Payer: Self-pay | Admitting: Oncology

## 2019-09-28 ENCOUNTER — Inpatient Hospital Stay: Payer: Medicare Other | Attending: Oncology | Admitting: Oncology

## 2019-09-28 ENCOUNTER — Inpatient Hospital Stay: Payer: Medicare Other

## 2019-09-28 VITALS — BP 141/96 | HR 89 | Temp 96.8°F | Resp 18 | Wt 178.5 lb

## 2019-09-28 DIAGNOSIS — I130 Hypertensive heart and chronic kidney disease with heart failure and stage 1 through stage 4 chronic kidney disease, or unspecified chronic kidney disease: Secondary | ICD-10-CM | POA: Diagnosis not present

## 2019-09-28 DIAGNOSIS — Z7901 Long term (current) use of anticoagulants: Secondary | ICD-10-CM | POA: Insufficient documentation

## 2019-09-28 DIAGNOSIS — D649 Anemia, unspecified: Secondary | ICD-10-CM | POA: Insufficient documentation

## 2019-09-28 DIAGNOSIS — Z87891 Personal history of nicotine dependence: Secondary | ICD-10-CM | POA: Diagnosis not present

## 2019-09-28 DIAGNOSIS — I2510 Atherosclerotic heart disease of native coronary artery without angina pectoris: Secondary | ICD-10-CM | POA: Diagnosis not present

## 2019-09-28 DIAGNOSIS — Z8551 Personal history of malignant neoplasm of bladder: Secondary | ICD-10-CM | POA: Diagnosis not present

## 2019-09-28 DIAGNOSIS — D72810 Lymphocytopenia: Secondary | ICD-10-CM | POA: Insufficient documentation

## 2019-09-28 DIAGNOSIS — Z79899 Other long term (current) drug therapy: Secondary | ICD-10-CM | POA: Insufficient documentation

## 2019-09-28 DIAGNOSIS — Z8546 Personal history of malignant neoplasm of prostate: Secondary | ICD-10-CM | POA: Diagnosis not present

## 2019-09-28 DIAGNOSIS — E1122 Type 2 diabetes mellitus with diabetic chronic kidney disease: Secondary | ICD-10-CM | POA: Diagnosis not present

## 2019-09-28 DIAGNOSIS — N1832 Chronic kidney disease, stage 3b: Secondary | ICD-10-CM | POA: Insufficient documentation

## 2019-09-28 DIAGNOSIS — I252 Old myocardial infarction: Secondary | ICD-10-CM | POA: Diagnosis not present

## 2019-09-28 LAB — CBC WITH DIFFERENTIAL/PLATELET
Abs Immature Granulocytes: 0.05 10*3/uL (ref 0.00–0.07)
Basophils Absolute: 0 10*3/uL (ref 0.0–0.1)
Basophils Relative: 0 %
Eosinophils Absolute: 0 10*3/uL (ref 0.0–0.5)
Eosinophils Relative: 0 %
HCT: 40.9 % (ref 39.0–52.0)
Hemoglobin: 12.9 g/dL — ABNORMAL LOW (ref 13.0–17.0)
Immature Granulocytes: 1 %
Lymphocytes Relative: 3 %
Lymphs Abs: 0.2 10*3/uL — ABNORMAL LOW (ref 0.7–4.0)
MCH: 27.2 pg (ref 26.0–34.0)
MCHC: 31.5 g/dL (ref 30.0–36.0)
MCV: 86.3 fL (ref 80.0–100.0)
Monocytes Absolute: 0.3 10*3/uL (ref 0.1–1.0)
Monocytes Relative: 5 %
Neutro Abs: 4.8 10*3/uL (ref 1.7–7.7)
Neutrophils Relative %: 91 %
Platelets: 216 10*3/uL (ref 150–400)
RBC: 4.74 MIL/uL (ref 4.22–5.81)
RDW: 20.4 % — ABNORMAL HIGH (ref 11.5–15.5)
WBC: 5.4 10*3/uL (ref 4.0–10.5)
nRBC: 0 % (ref 0.0–0.2)

## 2019-09-28 LAB — RETIC PANEL
Immature Retic Fract: 17.2 % — ABNORMAL HIGH (ref 2.3–15.9)
RBC.: 4.7 MIL/uL (ref 4.22–5.81)
Retic Count, Absolute: 64.5 10*3/uL (ref 19.0–186.0)
Retic Ct Pct: 1.4 % (ref 0.4–3.1)
Reticulocyte Hemoglobin: 21.5 pg — ABNORMAL LOW (ref >27.9)

## 2019-09-28 LAB — TECHNOLOGIST SMEAR REVIEW: Plt Morphology: ADEQUATE

## 2019-09-28 LAB — FERRITIN: Ferritin: 53 ng/mL (ref 24–336)

## 2019-09-28 LAB — COMPREHENSIVE METABOLIC PANEL
ALT: 23 U/L (ref 0–44)
AST: 18 U/L (ref 15–41)
Albumin: 3.8 g/dL (ref 3.5–5.0)
Alkaline Phosphatase: 117 U/L (ref 38–126)
Anion gap: 7 (ref 5–15)
BUN: 44 mg/dL — ABNORMAL HIGH (ref 8–23)
CO2: 19 mmol/L — ABNORMAL LOW (ref 22–32)
Calcium: 8.9 mg/dL (ref 8.9–10.3)
Chloride: 114 mmol/L — ABNORMAL HIGH (ref 98–111)
Creatinine, Ser: 1.8 mg/dL — ABNORMAL HIGH (ref 0.61–1.24)
GFR calc Af Amer: 44 mL/min — ABNORMAL LOW (ref >60)
GFR calc non Af Amer: 38 mL/min — ABNORMAL LOW (ref >60)
Glucose, Bld: 147 mg/dL — ABNORMAL HIGH (ref 70–99)
Potassium: 4.1 mmol/L (ref 3.5–5.1)
Sodium: 140 mmol/L (ref 135–145)
Total Bilirubin: 1.2 mg/dL (ref 0.3–1.2)
Total Protein: 6.6 g/dL (ref 6.5–8.1)

## 2019-09-28 LAB — IRON AND TIBC
Iron: 32 ug/dL — ABNORMAL LOW (ref 45–182)
Saturation Ratios: 10 % — ABNORMAL LOW (ref 17.9–39.5)
TIBC: 336 ug/dL (ref 250–450)
UIBC: 304 ug/dL

## 2019-09-28 LAB — HEPATITIS PANEL, ACUTE
HCV Ab: NONREACTIVE
Hep A IgM: NONREACTIVE
Hep B C IgM: NONREACTIVE
Hepatitis B Surface Ag: NONREACTIVE

## 2019-09-28 LAB — LACTATE DEHYDROGENASE: LDH: 113 U/L (ref 98–192)

## 2019-09-28 LAB — HIV ANTIBODY (ROUTINE TESTING W REFLEX): HIV Screen 4th Generation wRfx: NONREACTIVE

## 2019-09-28 LAB — FOLATE: Folate: 8.7 ng/mL (ref >5.9)

## 2019-09-28 LAB — VITAMIN B12: Vitamin B-12: 459 pg/mL (ref 180–914)

## 2019-09-28 NOTE — Progress Notes (Signed)
Patient here to establish care for anemia. Pt is on prophylactic antibiotic for possible bronchitis.

## 2019-09-28 NOTE — Progress Notes (Signed)
Hematology/Oncology Consult note [facility] [phone] [phone]   Patient Care Team: Maryland Pink, MD as PCP - General (Family Medicine)  REFERRING PROVIDER: Maryland Pink, MD  CHIEF COMPLAINTS/REASON FOR VISIT:  Evaluation of anemia  HISTORY OF PRESENTING ILLNESS:   Larry Mann is a  69 y.o.  male with PMH listed below was seen in consultation at the request of  Maryland Pink, MD  for evaluation of anemia I reviewed patient's previous medical records via care everywhere. 09/20/2019 CBC showed white count of 2.9, hemoglobin 12.3, platelet 1 56,000, decreased lymphocyte, increased neutrophil percentage, reviewed his previous lab results. Anemia appears to be chronic onset, duration since at least 2018. Patient has history of chronic kidney disease, CHF on Lasix, bilateral lower extremity swelling, history of MI, diabetes, history of basal cell carcinoma.  He takes nitrofurantoin chronically for suppression of recurrent UTI.  On chronic anticoagulation with Eliquis 5 mg twice daily.  Denies any bleeding events.  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  Accession #:  [id number]  Diagnosis:  FSA: Ureter margin, right distal, biopsy  - Perpendicular cross sections of benign urothelium and ureter wall  - No urothelial dysplasia, papillary urothelial neoplasm or invasive carcinoma  identified  FSB: Ureter, left distal margin, biopsy  - Longitudinalsections of benign urothelium and ureter wall  - No urothelial dysplasia, papillary urothelial neoplasm or invasive carcinoma  identified  C: Lymph node, left external iliac, regional node dissection  - No metastatic carcinoma identified (0/1)   D: Lymph nodes, right external iliac, regional node dissection  - No metastatic carcinoma identified (0/4)   E: Lymph nodes, left obturator, regional node dissection  - No metastatic  carcinoma identified (0/4 lymph node fragments)    F: Lymph nodes, right obturator, regional node dissection  - No metastatic carcinoma identified (0/5 lymph node fragments)  G: Bladder and prostate, cystoprostatectomy   Tumor histologic type:   - Papillary urothelial carcinoma, high grade, and urothelial carcinoma in situ,  non-invasive.    Tumor histologic grade [facility]): high  Microscopic tumor size: grossly 2.5 cm diameter  Microscopic extent of invasion: non-invasive  Tumor site/focality of tumor:Right lateral wall at right UVJ grossly, but  multifocal microscopically  Angiolymphatic space invasion: not identified  Associated epithelial lesions: see above  Ureters: UCIS left UVJ  Histologic assessment of surgical margins:    Ureter, left:negative     Ureter, right: negative     Urethra: negative    Perivesicular soft tissue: negative   Other significant findings: necrotizing granulomatous inflammation, consistent  with prior BCG treatment  Lymph nodes:Separately submitted, see specimens C-F, above, all negative,  total 0/14  Diagnoses of other organs or structures: Prostate and seminal vesicles: no  carcinoma identified  AJCC Stage (bladder): pTa/pTis  pN0  pMx (current case only)  Patient follows up with [facility] urology and was last seen on 09/02/2019. 12/21/2013, he also has a history of kidney stone extraction-100% calcium oxalate monohydrate stones  Patient reports chronic fatigue.  No unintentional weight loss, night sweating, fever or chills.  Review of Systems  Constitutional: Positive for fatigue. Negative for appetite change, chills, fever and unexpected weight change.  HENT:   Negative for hearing loss and voice change.   Eyes: Negative for eye problems and icterus.  Respiratory: Negative for chest tightness, cough and shortness of breath.   Cardiovascular: Negative for chest pain and leg swelling.    Gastrointestinal: Negative  for abdominal distention and abdominal pain.  Endocrine: Negative for hot flashes.  Genitourinary: Negative for difficulty urinating, dysuria and frequency.   Musculoskeletal: Negative for arthralgias.  Skin: Negative for itching and rash.  Neurological: Negative for light-headedness and numbness.  Hematological: Negative for adenopathy. Does not bruise/bleed easily.  Psychiatric/Behavioral: Negative for confusion.    MEDICAL HISTORY:  Past Medical History:  Diagnosis Date  . Anginal pain ([place])   . Basal cell carcinoma 06/20/2009   R nose supratip  . Bladder cancer ([place])   . Cancer ([place])   . Chicken pox   . Chronic kidney disease   . Coronary artery disease   . Diabetes mellitus without complication ([place])   . Headache   . Hypertension   . Myocardial infarction ([place])   . Plantar fasciitis   . Prostate cancer [facility])     SURGICAL HISTORY: Past Surgical History:  Procedure Laterality Date  . CARDIOVERSION N/A 04/23/2017   Procedure: CARDIOVERSION;  Surgeon: Corey Skains, MD;  Location: [facility] ORS;  Service: Cardiovascular;  Laterality: N/A;  . CARDIOVERSION N/A 03/27/2018   Procedure: CARDIOVERSION (CATH LAB);  Surgeon: Corey Skains, MD;  Location: [facility] ORS;  Service: Cardiovascular;  Laterality: N/A;  . COLONOSCOPY    . COLONOSCOPY WITH PROPOFOL N/A 10/27/2014   Procedure: COLONOSCOPY WITH PROPOFOL;  Surgeon: Manya Silvas, MD;  Location: [facility] ENDOSCOPY;  Service: Endoscopy;  Laterality: N/A;  . CORONARY ANGIOPLASTY    . CYSTECTOMY    . HERNIA REPAIR    . LITHOTRIPSY    . MOHS SURGERY    . PROSTATE SURGERY      SOCIAL HISTORY: Social History   Socioeconomic History  . Marital status: Married    Spouse name: Not on file  . Number of children: Not on file  . Years of education: Not on file  . Highest education level: Not on file  Occupational History  . Not on file  Tobacco Use  . Smoking status: Former Smoker    Packs/day:  1.50    Years: 25.00    Pack years: 37.50    Quit date: 05/19/2001    Years since quitting: 18.3  . Smokeless tobacco: Never Used  Vaping Use  . Vaping Use: Never used  Substance and Sexual Activity  . Alcohol use: Yes    Comment: occasional  . Drug use: No  . Sexual activity: Not on file  Other Topics Concern  . Not on file  Social History Narrative  . Not on file   Social Determinants of Health   Financial Resource Strain:   . Difficulty of Paying Living Expenses:   Food Insecurity:   . Worried About Charity fundraiser in the Last Year:   . Arboriculturist in the Last Year:   Transportation Needs:   . Film/video editor (Medical):   Marland Kitchen Lack of Transportation (Non-Medical):   Physical Activity:   . Days of Exercise per Week:   . Minutes of Exercise per Session:   Stress:   . Feeling of Stress :   Social Connections:   . Frequency of Communication with Friends and Family:   . Frequency of Social Gatherings with Friends and Family:   . Attends Religious Services:   . Active Member of Clubs or Organizations:   . Attends Archivist Meetings:   Marland Kitchen Marital Status:   Intimate Partner Violence:   . Fear of Current or Ex-Partner:   . Emotionally Abused:   .  Physically Abused:   . Sexually Abused:     FAMILY HISTORY: Family History  Problem Relation Age of Onset  . Multiple myeloma Mother     ALLERGIES:  has No Known Allergies.  MEDICATIONS:  Current Outpatient Medications  Medication Sig Dispense Refill  . atorvastatin (LIPITOR) 20 MG tablet Take 10 mg by mouth daily.     . carvedilol (COREG) 12.5 MG tablet Take 12.5 mg by mouth 2 (two) times daily with a meal.    . dapagliflozin propanediol (FARXIGA) 10 MG TABS tablet Take by mouth.    Arne [place] 5 MG TABS tablet Take 5 mg by mouth 2 (two) times daily.  11  . fluticasone (FLONASE) 50 MCG/ACT nasal spray SHAKE LIQUID AND USE 1 SPRAY IN EACH NOSTRIL TWICE DAILY    . furosemide (LASIX) 40 MG tablet Take  20 mg by mouth daily.    Marland Kitchen glipiZIDE (GLUCOTROL XL) 5 MG 24 hr tablet Take 5 mg by mouth 2 (two) times daily.     . metFORMIN (GLUCOPHAGE) 1000 MG tablet Take 1,000 mg by mouth 2 (two) times daily with a meal.    . nitrofurantoin, macrocrystal-monohydrate, (MACROBID) 100 MG capsule Take 100 mg by mouth daily.    . predniSONE (DELTASONE) 10 MG tablet Take by mouth. Take 1 tablet (10 mg total) by mouth once daily for 10 days    . sacubitril-valsartan (ENTRESTO) 49-51 MG Take 1 tablet by mouth 2 (two) times daily.    . vitamin B-12 (CYANOCOBALAMIN) 1000 MCG tablet Take 1,000 mcg by mouth daily.     No current facility-administered medications for this visit.     PHYSICAL EXAMINATION: ECOG PERFORMANCE STATUS: 0 - Asymptomatic Vitals:   09/28/19 1131  BP: (!) 141/96  Pulse: 89  Resp: 18  Temp: (!) 96.8 F (36 C)   Filed Weights   09/28/19 1131  Weight: 178 lb 8 oz (81 kg)    Physical Exam Constitutional:      General: He is not in acute distress. HENT:     Head: Normocephalic and atraumatic.  Eyes:     General: No scleral icterus. Cardiovascular:     Rate and Rhythm: Normal rate and regular rhythm.     Heart sounds: Normal heart sounds.  Pulmonary:     Effort: Pulmonary effort is normal. No respiratory distress.     Breath sounds: No wheezing.  Abdominal:     General: Bowel sounds are normal. There is no distension.     Palpations: Abdomen is soft.     Comments: + Urostomy bag  Musculoskeletal:        General: Swelling present. No deformity. Normal range of motion.     Cervical back: Normal range of motion and neck supple.     Comments: Bilateral lower extremity 2+ edema  Skin:    General: Skin is warm and dry.     Findings: No erythema or rash.  Neurological:     Mental Status: He is alert and oriented to person, place, and time. Mental status is at baseline.     Cranial Nerves: No cranial nerve deficit.     Coordination: Coordination normal.  Psychiatric:         Mood and Affect: Mood normal.     LABORATORY DATA:  I have reviewed the data as listed Lab Results  Component Value Date   WBC 5.4 09/28/2019   HGB 12.9 (L) 09/28/2019   HCT 40.9 09/28/2019   MCV 86.3 09/28/2019  PLT 216 09/28/2019   Recent Labs    09/28/19 1203  NA 140  K 4.1  CL 114*  CO2 19*  GLUCOSE 147*  BUN 44*  CREATININE 1.80*  CALCIUM 8.9  GFRNONAA 38*  GFRAA 44*  PROT 6.6  ALBUMIN 3.8  AST 18  ALT 23  ALKPHOS 117  BILITOT 1.2   Iron/TIBC/Ferritin/ %Sat    Component Value Date/Time   IRON 32 (L) 09/28/2019 1203   TIBC 336 09/28/2019 1203   FERRITIN 53 09/28/2019 1203   IRONPCTSAT 10 (L) 09/28/2019 1203      RADIOGRAPHIC STUDIES: I have personally reviewed the radiological images as listed and agreed with the findings in the report. No results found.    ASSESSMENT & PLAN:  1. Anemia, unspecified type   2. Lymphocytopenia   3. Stage 3b chronic kidney disease   4. History of bladder cancer    Anemia: multifactorial with possible causes including chronic blood loss, hyper/hypothyroidism, nutritional deficiency, infection/chronic inflammation, hemolysis, underlying bone marrow disorders.  Patient has chronic kidney disease, which may contribute to his anemia. Will check CBC w differential, CMP, vitamin B12, Folate, iron/TIBC, ferritin, reticulocytes,  blood smear, TSH,  LDH, haptoglobin, monoclonal gammopathy evaluation.    Leukopenia, predominantly lymphocytopenia.  Check LDH, flow cytometry.  Hepatitis, HIV CKD stage IIIb, check multiple myeloma panel. History of bladder cancer, follow-up with urology. Orders Placed This Encounter  Procedures  . CBC with Differential/Platelet    Standing Status:   Future    Number of Occurrences:   1    Standing Expiration Date:   09/27/2020  . Comprehensive metabolic panel    Standing Status:   Future    Number of Occurrences:   1    Standing Expiration Date:   09/27/2020  . Lactate dehydrogenase     Standing Status:   Future    Number of Occurrences:   1    Standing Expiration Date:   09/27/2020  . Technologist smear review    Standing Status:   Future    Number of Occurrences:   1    Standing Expiration Date:   09/27/2020  . Folate    Standing Status:   Future    Number of Occurrences:   1    Standing Expiration Date:   09/27/2020  . Vitamin B12    Standing Status:   Future    Number of Occurrences:   1    Standing Expiration Date:   09/27/2020  . Hepatitis panel, acute    Standing Status:   Future    Number of Occurrences:   1    Standing Expiration Date:   09/27/2020  . HIV Antibody (routine testing w rflx)    Standing Status:   Future    Number of Occurrences:   1    Standing Expiration Date:   09/27/2020  . Ferritin    Standing Status:   Future    Number of Occurrences:   1    Standing Expiration Date:   09/27/2020  . Iron and TIBC    Standing Status:   Future    Number of Occurrences:   1    Standing Expiration Date:   09/27/2020  . Retic Panel    Standing Status:   Future    Number of Occurrences:   1    Standing Expiration Date:   09/27/2020  . Flow cytometry panel-leukemia/lymphoma work-up    Standing Status:   Future    Number of  Occurrences:   1    Standing Expiration Date:   09/27/2020  . Kappa/lambda light chains    Standing Status:   Future    Number of Occurrences:   1    Standing Expiration Date:   09/27/2020  . Multiple Myeloma Panel (SPEP&IFE w/QIG)    Standing Status:   Future    Number of Occurrences:   1    Standing Expiration Date:   09/27/2020    All questions were answered. The patient knows to call the clinic with any problems questions or concerns.  cc Maryland Pink, MD    Return of visit: 2 weeks Thank you for this kind referral and the opportunity to participate in the care of this patient. A copy of today's note is routed to referring provider    Earlie Server, MD, PhD Hematology Oncology [facility] Pager- [phone] 09/28/2019

## 2019-09-29 LAB — KAPPA/LAMBDA LIGHT CHAINS
Kappa free light chain: 50.3 mg/L — ABNORMAL HIGH (ref 3.3–19.4)
Kappa, lambda light chain ratio: 1.86 — ABNORMAL HIGH (ref 0.26–1.65)
Lambda free light chains: 27 mg/L — ABNORMAL HIGH (ref 5.7–26.3)

## 2019-09-30 LAB — COMP PANEL: LEUKEMIA/LYMPHOMA

## 2019-10-01 LAB — MULTIPLE MYELOMA PANEL, SERUM
Albumin SerPl Elph-Mcnc: 3.9 g/dL (ref 2.9–4.4)
Albumin/Glob SerPl: 1.7 (ref 0.7–1.7)
Alpha 1: 0.2 g/dL (ref 0.0–0.4)
Alpha2 Glob SerPl Elph-Mcnc: 0.6 g/dL (ref 0.4–1.0)
B-Globulin SerPl Elph-Mcnc: 0.9 g/dL (ref 0.7–1.3)
Gamma Glob SerPl Elph-Mcnc: 0.6 g/dL (ref 0.4–1.8)
Globulin, Total: 2.3 g/dL (ref 2.2–3.9)
IgA: 223 mg/dL (ref 61–437)
IgG (Immunoglobin G), Serum: 643 mg/dL (ref 603–1613)
IgM (Immunoglobulin M), Srm: 61 mg/dL (ref 20–172)
Total Protein ELP: 6.2 g/dL (ref 6.0–8.5)

## 2019-10-09 ENCOUNTER — Inpatient Hospital Stay
Admission: EM | Admit: 2019-10-09 | Discharge: 2019-10-12 | DRG: 872 | Disposition: A | Discharge status: Home / Self Care | Payer: Medicare Other | Attending: Internal Medicine | Admitting: Internal Medicine

## 2019-10-09 ENCOUNTER — Other Ambulatory Visit: Payer: Self-pay

## 2019-10-09 ENCOUNTER — Emergency Department: Payer: Medicare Other

## 2019-10-09 DIAGNOSIS — R7989 Other specified abnormal findings of blood chemistry: Secondary | ICD-10-CM

## 2019-10-09 DIAGNOSIS — I429 Cardiomyopathy, unspecified: Secondary | ICD-10-CM

## 2019-10-09 DIAGNOSIS — N179 Acute kidney failure, unspecified: Secondary | ICD-10-CM | POA: Diagnosis present

## 2019-10-09 DIAGNOSIS — E876 Hypokalemia: Secondary | ICD-10-CM | POA: Diagnosis not present

## 2019-10-09 DIAGNOSIS — Z8546 Personal history of malignant neoplasm of prostate: Secondary | ICD-10-CM

## 2019-10-09 DIAGNOSIS — Z87891 Personal history of nicotine dependence: Secondary | ICD-10-CM

## 2019-10-09 DIAGNOSIS — I10 Essential (primary) hypertension: Secondary | ICD-10-CM | POA: Diagnosis present

## 2019-10-09 DIAGNOSIS — R112 Nausea with vomiting, unspecified: Secondary | ICD-10-CM

## 2019-10-09 DIAGNOSIS — R509 Fever, unspecified: Secondary | ICD-10-CM

## 2019-10-09 DIAGNOSIS — R188 Other ascites: Secondary | ICD-10-CM | POA: Diagnosis present

## 2019-10-09 DIAGNOSIS — C679 Malignant neoplasm of bladder, unspecified: Secondary | ICD-10-CM | POA: Diagnosis present

## 2019-10-09 DIAGNOSIS — Z8551 Personal history of malignant neoplasm of bladder: Secondary | ICD-10-CM

## 2019-10-09 DIAGNOSIS — Z8616 Personal history of COVID-19: Secondary | ICD-10-CM

## 2019-10-09 DIAGNOSIS — N1832 Chronic kidney disease, stage 3b: Secondary | ICD-10-CM | POA: Diagnosis present

## 2019-10-09 DIAGNOSIS — E1169 Type 2 diabetes mellitus with other specified complication: Secondary | ICD-10-CM

## 2019-10-09 DIAGNOSIS — I5022 Chronic systolic (congestive) heart failure: Secondary | ICD-10-CM | POA: Diagnosis present

## 2019-10-09 DIAGNOSIS — A4151 Sepsis due to Escherichia coli [E. coli]: Principal | ICD-10-CM | POA: Diagnosis present

## 2019-10-09 DIAGNOSIS — R0902 Hypoxemia: Secondary | ICD-10-CM | POA: Diagnosis present

## 2019-10-09 DIAGNOSIS — I248 Other forms of acute ischemic heart disease: Secondary | ICD-10-CM | POA: Diagnosis present

## 2019-10-09 DIAGNOSIS — Z20822 Contact with and (suspected) exposure to covid-19: Secondary | ICD-10-CM | POA: Diagnosis present

## 2019-10-09 DIAGNOSIS — I130 Hypertensive heart and chronic kidney disease with heart failure and stage 1 through stage 4 chronic kidney disease, or unspecified chronic kidney disease: Secondary | ICD-10-CM | POA: Diagnosis present

## 2019-10-09 DIAGNOSIS — Z7901 Long term (current) use of anticoagulants: Secondary | ICD-10-CM

## 2019-10-09 DIAGNOSIS — Z9861 Coronary angioplasty status: Secondary | ICD-10-CM

## 2019-10-09 DIAGNOSIS — D696 Thrombocytopenia, unspecified: Secondary | ICD-10-CM | POA: Diagnosis present

## 2019-10-09 DIAGNOSIS — I2510 Atherosclerotic heart disease of native coronary artery without angina pectoris: Secondary | ICD-10-CM | POA: Diagnosis present

## 2019-10-09 DIAGNOSIS — R05 Cough: Secondary | ICD-10-CM

## 2019-10-09 DIAGNOSIS — A419 Sepsis, unspecified organism: Secondary | ICD-10-CM | POA: Diagnosis present

## 2019-10-09 DIAGNOSIS — R059 Cough, unspecified: Secondary | ICD-10-CM

## 2019-10-09 DIAGNOSIS — Z7984 Long term (current) use of oral hypoglycemic drugs: Secondary | ICD-10-CM

## 2019-10-09 DIAGNOSIS — E1122 Type 2 diabetes mellitus with diabetic chronic kidney disease: Secondary | ICD-10-CM | POA: Diagnosis present

## 2019-10-09 DIAGNOSIS — E119 Type 2 diabetes mellitus without complications: Secondary | ICD-10-CM

## 2019-10-09 DIAGNOSIS — I252 Old myocardial infarction: Secondary | ICD-10-CM

## 2019-10-09 DIAGNOSIS — Z79899 Other long term (current) drug therapy: Secondary | ICD-10-CM

## 2019-10-09 DIAGNOSIS — Z85828 Personal history of other malignant neoplasm of skin: Secondary | ICD-10-CM

## 2019-10-09 DIAGNOSIS — R778 Other specified abnormalities of plasma proteins: Secondary | ICD-10-CM

## 2019-10-09 DIAGNOSIS — R6520 Severe sepsis without septic shock: Secondary | ICD-10-CM | POA: Diagnosis present

## 2019-10-09 LAB — CBC WITH DIFFERENTIAL/PLATELET
Abs Immature Granulocytes: 0.08 10*3/uL — ABNORMAL HIGH (ref 0.00–0.07)
Basophils Absolute: 0 10*3/uL (ref 0.0–0.1)
Basophils Relative: 0 %
Eosinophils Absolute: 0 10*3/uL (ref 0.0–0.5)
Eosinophils Relative: 0 %
HCT: 41.2 % (ref 39.0–52.0)
Hemoglobin: 12.7 g/dL — ABNORMAL LOW (ref 13.0–17.0)
Immature Granulocytes: 1 %
Lymphocytes Relative: 1 %
Lymphs Abs: 0.2 10*3/uL — ABNORMAL LOW (ref 0.7–4.0)
MCH: 27 pg (ref 26.0–34.0)
MCHC: 30.8 g/dL (ref 30.0–36.0)
MCV: 87.5 fL (ref 80.0–100.0)
Monocytes Absolute: 0.7 10*3/uL (ref 0.1–1.0)
Monocytes Relative: 5 %
Neutro Abs: 14.4 10*3/uL — ABNORMAL HIGH (ref 1.7–7.7)
Neutrophils Relative %: 93 %
Platelets: 143 10*3/uL — ABNORMAL LOW (ref 150–400)
RBC: 4.71 MIL/uL (ref 4.22–5.81)
RDW: 20.1 % — ABNORMAL HIGH (ref 11.5–15.5)
WBC: 15.4 10*3/uL — ABNORMAL HIGH (ref 4.0–10.5)
nRBC: 0 % (ref 0.0–0.2)

## 2019-10-09 LAB — COMPREHENSIVE METABOLIC PANEL
ALT: 21 U/L (ref 0–44)
AST: 19 U/L (ref 15–41)
Albumin: 3.8 g/dL (ref 3.5–5.0)
Alkaline Phosphatase: 106 U/L (ref 38–126)
Anion gap: 10 (ref 5–15)
BUN: 40 mg/dL — ABNORMAL HIGH (ref 8–23)
CO2: 18 mmol/L — ABNORMAL LOW (ref 22–32)
Calcium: 8.4 mg/dL — ABNORMAL LOW (ref 8.9–10.3)
Chloride: 113 mmol/L — ABNORMAL HIGH (ref 98–111)
Creatinine, Ser: 2.24 mg/dL — ABNORMAL HIGH (ref 0.61–1.24)
GFR calc Af Amer: 34 mL/min — ABNORMAL LOW (ref >60)
GFR calc non Af Amer: 29 mL/min — ABNORMAL LOW (ref >60)
Glucose, Bld: 134 mg/dL — ABNORMAL HIGH (ref 70–99)
Potassium: 3.5 mmol/L (ref 3.5–5.1)
Sodium: 141 mmol/L (ref 135–145)
Total Bilirubin: 2.3 mg/dL — ABNORMAL HIGH (ref 0.3–1.2)
Total Protein: 6.5 g/dL (ref 6.5–8.1)

## 2019-10-09 LAB — LACTIC ACID, PLASMA: Lactic Acid, Venous: 1.3 mmol/L (ref 0.5–1.9)

## 2019-10-09 MED ORDER — SODIUM CHLORIDE 0.9% FLUSH
3.0000 mL | Freq: Once | INTRAVENOUS | Status: AC
  Administered 2019-10-11: 3 mL via INTRAVENOUS

## 2019-10-09 NOTE — ED Notes (Signed)
Per lab lactic not sent down on ice, went to recollect and pt was on his way to xray

## 2019-10-09 NOTE — ED Triage Notes (Signed)
Pt arrives via POV for reports of vomiting and fever since last night. Pt reports mild cough. Pt reports fevers up to 101. Pt has a urostomy bag but states he has had that for 5 years. Pt denies chest pain. Pt speaking with this RN in complete sentences without shob. Skin warm and dry.

## 2019-10-09 NOTE — ED Provider Notes (Signed)
[facility] Emergency Department Provider Note   ____________________________________________   None    (approximate)  I have reviewed the triage vital signs and the nursing notes.   HISTORY  Chief Complaint Fever and Emesis    HPI EDEM TIEGS is a 69 y.o. male who presents to the ED from home with a chief complaint of fever, vomiting and diarrhea.  Patient has a history of bladder cancer status post urostomy, CKD, diabetes, hypertension.  Symptoms onset last evening.  Had a "twinge" of left flank pain and thought he was having another kidney stone.  Vomited twice last night with a loose stool.  Reports fever up to 101 F.  Last Tylenol taken approximately noon.  Also notes mild nonproductive cough.  Denies chest pain, shortness of breath, abdominal pain.       Past Medical History:  Diagnosis Date  . Anginal pain ([place])   . Basal cell carcinoma 06/20/2009   R nose supratip  . Bladder cancer ([place])   . Cancer ([place])   . Chicken pox   . Chronic kidney disease   . Coronary artery disease   . Diabetes mellitus without complication ([place])   . Headache   . Hypertension   . Myocardial infarction ([place])   . Plantar fasciitis   . Prostate cancer [facility])     Patient Active Problem List   Diagnosis Date Noted  . Diabetes mellitus ([place]) 11/14/2016  . Diabetes mellitus type 2, uncomplicated ([place]) 70/26/3785  . HTN (hypertension) 11/14/2016  . 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[place]) 10/17/2016  . Benign essential hypertension 11/10/2014  . Bladder cancer ([place]) 10/18/2013    Past Surgical History:  Procedure Laterality Date  . CARDIOVERSION N/A 04/23/2017   Procedure: CARDIOVERSION;  Surgeon: Corey Skains, MD;  Location: [facility] ORS;  Service: Cardiovascular;  Laterality: N/A;  . CARDIOVERSION N/A 03/27/2018   Procedure: CARDIOVERSION (CATH LAB);  Surgeon: Corey Skains, MD;  Location: [facility] ORS;  Service: Cardiovascular;  Laterality: N/A;  .  COLONOSCOPY    . COLONOSCOPY WITH PROPOFOL N/A 10/27/2014   Procedure: COLONOSCOPY WITH PROPOFOL;  Surgeon: Manya Silvas, MD;  Location: [facility] ENDOSCOPY;  Service: Endoscopy;  Laterality: N/A;  . CORONARY ANGIOPLASTY    . CYSTECTOMY    . HERNIA REPAIR    . LITHOTRIPSY    . MOHS SURGERY    . PROSTATE SURGERY      Prior to Admission medications   Medication Sig Start Date End Date Taking? Authorizing Provider  atorvastatin (LIPITOR) 20 MG tablet Take 10 mg by mouth daily.     [provider]  carvedilol (COREG) 12.5 MG tablet Take 12.5 mg by mouth 2 (two) times daily with a meal.    [provider]  dapagliflozin propanediol (FARXIGA) 10 MG TABS tablet Take by mouth. 06/28/19 06/27/20  [provider]  ELIQUIS 5 MG TABS tablet Take 5 mg by mouth 2 (two) times daily. 03/24/17   [provider]  fluticasone (FLONASE) 50 MCG/ACT nasal spray SHAKE LIQUID AND USE 1 SPRAY IN EACH NOSTRIL TWICE DAILY 09/20/19   [provider]  furosemide (LASIX) 40 MG tablet Take 20 mg by mouth daily. 07/21/19   [provider]  glipiZIDE (GLUCOTROL XL) 5 MG 24 hr tablet Take 5 mg by mouth 2 (two) times daily.     [provider]  metFORMIN (GLUCOPHAGE) 1000 MG tablet Take 1,000 mg by mouth 2 (two) times daily with a meal.    [provider]  nitrofurantoin, macrocrystal-monohydrate, (MACROBID) 100 MG capsule Take 100 mg by mouth daily. 08/06/19   [provider]  sacubitril-valsartan (ENTRESTO) 49-51 MG Take 1 tablet by mouth 2 (two) times daily.    [provider]  vitamin B-12 (CYANOCOBALAMIN) 1000 MCG tablet Take 1,000 mcg by mouth daily. 09/02/19   [provider]    Allergies Patient has no known allergies.  Family History  Problem Relation Age of Onset  . Multiple myeloma Mother     Social History Social History   Tobacco Use  . Smoking status: Former Smoker    Packs/day: 1.50    Years: 25.00    Pack  years: 37.50    Quit date: 05/19/2001    Years since quitting: 18.4  . Smokeless tobacco: Never Used  Vaping Use  . Vaping Use: Never used  Substance Use Topics  . Alcohol use: Yes    Comment: occasional  . Drug use: No    Review of Systems  Constitutional: Positive for fever. Eyes: No visual changes. ENT: No sore throat. Cardiovascular: Denies chest pain. Respiratory: Positive for cough.  Denies shortness of breath. Gastrointestinal: No abdominal pain.  Positive for nausea, vomiting and diarrhea.  No constipation. Genitourinary: Negative for dysuria. Musculoskeletal: Negative for back pain. Skin: Negative for rash. Neurological: Negative for headaches, focal weakness or numbness.   ____________________________________________   PHYSICAL EXAM:  VITAL SIGNS: ED Triage Vitals  Enc Vitals Group     BP 10/09/19 1822 (!) 136/78     Pulse Rate 10/09/19 1822 (!) 108     Resp 10/09/19 1822 18     Temp 10/09/19 1822 99.2 F (37.3 C)     Temp Source 10/09/19 1822 Oral     SpO2 10/09/19 1822 93 %     Weight 10/09/19 1823 175 lb (79.4 kg)     Height 10/09/19 1823 5' 6"  (1.676 m)     Head Circumference --      Peak Flow --      Pain Score 10/09/19 1823 0     Pain Loc --      Pain Edu? --      Excl. in [place]? --     Constitutional: Alert and oriented. Well appearing and in no acute distress. Eyes: Conjunctivae are normal. PERRL. EOMI. Head: Atraumatic. Nose: No congestion/rhinnorhea. Mouth/Throat: Mucous membranes are mildly dry.   Neck: No stridor.   Cardiovascular: Normal rate, regular rhythm. Grossly normal heart sounds.  Good peripheral circulation. Respiratory: Normal respiratory effort.  No retractions. Lungs CTAB. Gastrointestinal: Soft and nontender to light or deep palpation.  Urostomy intact.  No distention. No abdominal bruits. No CVA tenderness. Musculoskeletal: No lower extremity tenderness nor edema.  No joint effusions. Neurologic:  Normal speech and  language. No gross focal neurologic deficits are appreciated.  Skin:  Skin is warm, dry and intact. No rash noted.  No petechiae. Psychiatric: Mood and affect are normal. Speech and behavior are normal.  ____________________________________________   LABS (all labs ordered are listed, but only abnormal results are displayed)  Labs Reviewed  COMPREHENSIVE METABOLIC PANEL - Abnormal; Notable for the following components:      Result Value   Chloride 113 (*)    CO2 18 (*)    Glucose, Bld 134 (*)    BUN 40 (*)    Creatinine, Ser 2.24 (*)    Calcium 8.4 (*)    Total Bilirubin 2.3 (*)    GFR calc non Af Amer 29 (*)  GFR calc Af Amer 34 (*)    All other components within normal limits  CBC WITH DIFFERENTIAL/PLATELET - Abnormal; Notable for the following components:   WBC 15.4 (*)    Hemoglobin 12.7 (*)    RDW 20.1 (*)    Platelets 143 (*)    Neutro Abs 14.4 (*)    Lymphs Abs 0.2 (*)    Abs Immature Granulocytes 0.08 (*)    All other components within normal limits  TROPONIN I (HIGH SENSITIVITY) - Abnormal; Notable for the following components:   Troponin I (High Sensitivity) 73 (*)    All other components within normal limits  URINE CULTURE  CULTURE, BLOOD (ROUTINE X 2)  CULTURE, BLOOD (ROUTINE X 2)  LACTIC ACID, PLASMA  URINALYSIS, COMPLETE (UACMP) WITH MICROSCOPIC  PROCALCITONIN   ____________________________________________  EKG  ED ECG REPORT I, Madolin Twaddle J, the attending physician, personally viewed and interpreted this ECG.   Date: 10/10/2019  EKG Time: 0028  Rate: 95  Rhythm: normal EKG, normal sinus rhythm  Axis: Normal  Intervals:none  ST&T Change: Nonspecific (EKG would not print or export; this is read directly off cardiac monitor)  ____________________________________________  RADIOLOGY  ED MD interpretation: No acute cardiopulmonary process; no obstructive uropathy, cholelithiasis without cholecystitis  Official radiology report(s): DG Chest 2  View  Result Date: 10/09/2019 CLINICAL DATA:  Cough and shortness of breath for 2 days EXAM: CHEST - 2 VIEW COMPARISON:  10/07/2012 FINDINGS: Cardiac shadow is enlarged. Defibrillator is noted. The lungs are well aerated bilaterally. No focal infiltrate or sizable effusion is seen. No bony abnormality is noted. IMPRESSION: No acute abnormality Electronically Signed   By: Inez Catalina M.D.   On: 10/09/2019 19:10   CT Renal Stone Study  Result Date: 10/10/2019 CLINICAL DATA:  Flank pain and fevers EXAM: CT ABDOMEN AND PELVIS WITHOUT CONTRAST TECHNIQUE: Multidetector CT imaging of the abdomen and pelvis was performed following the standard protocol without IV contrast. COMPARISON:  01/05/2011 FINDINGS: Lower chest: Small effusions are noted bilaterally right greater than left. No focal infiltrate is seen. Hepatobiliary: Gallbladder is well distended. Multiple dependent gallstones are seen. No pericholecystic fluid is noted. The liver demonstrates a normal appearance. Perihepatic fluid is noted consistent with ascites. Pancreas: Unremarkable. No pancreatic ductal dilatation or surrounding inflammatory changes. Spleen: Normal in size without focal abnormality. Adrenals/Urinary Tract: Adrenal glands are within normal limits. Kidneys demonstrate a normal appearance without obstructive change bilaterally. Lower pole renal stone is noted on the left measuring 8-9 mm. This is slightly smaller than that seen on the prior exam. Right renal cyst is again noted and stable. Mild fullness of the collecting systems is seen without obstructing stone. Bladder has been surgically removed. A ileal loop is noted in the right mid abdomen. Stomach/Bowel: Diverticular change of the colon is noted without evidence of diverticulitis. No obstructive or inflammatory changes of the colon are seen. The appendix is within normal limits. Small bowel and stomach are unremarkable with the exception of postsurgical changes related to the ileal  loop. Vascular/Lymphatic: Aortic atherosclerosis. No enlarged abdominal or pelvic lymph nodes. Reproductive: Prostate is not visualized consistent with a prior surgical history. Other: Free fluid is noted throughout the abdomen and pelvis. Some herniation of fluid through the ostomy loop in the right mid abdomen is noted. Musculoskeletal: No acute or significant osseous findings. IMPRESSION: New ascites. Cholelithiasis without complicating factors. Nonobstructing left renal stone. Ileal loop is seen without definitive obstructive change. Diverticulosis without diverticulitis. Small effusions. Electronically Signed  By: Inez Catalina M.D.   On: 10/10/2019 00:08    ____________________________________________   PROCEDURES  Procedure(s) performed (including Critical Care):  Procedures   ____________________________________________   INITIAL IMPRESSION / ASSESSMENT AND PLAN / ED COURSE  As part of my medical decision making, I reviewed the following data within the [place] History obtained from family, Nursing notes reviewed and incorporated, Labs reviewed, EKG interpreted, Old chart reviewed, Radiograph reviewed and Notes from prior ED visits     SEITH AIKEY was evaluated in Emergency Department on 10/10/2019 for the symptoms described in the history of present illness. He was evaluated in the context of the global COVID-19 pandemic, which necessitated consideration that the patient might be at risk for infection with the SARS-CoV-2 virus that causes COVID-19. Institutional protocols and algorithms that pertain to the evaluation of patients at risk for COVID-19 are in a state of rapid change based on information released by regulatory bodies including the CDC and federal and state organizations. These policies and algorithms were followed during the patient's care in the ED.    69 year old male presenting with fever, vomiting and cough.  Urostomy in place.  Differential  diagnosis includes but is not limited to sepsis, viral process, especially QASTM-19, ureteral colic, CAP, colitis, etc.  Laboratory results demonstrate moderate leukocytosis, AKI, normal lactic acid.  Will check blood cultures, procalcitonin, UA/culture.  Will obtain CT renal colic study given patient's history of nephrolithiasis.  Clinical Course as of Oct 10 47  Sat Oct 09, 2019  2339 Patient refuses Covid swab.  States he has both had Covid as well as the vaccinations.   [JS]  Sun Oct 10, 2019  0045 Updated patient and spouse on elevated troponin.  Noted elevated troponin which is likely stress leak.  Will administer empiric antibiotics.  Will discuss with hospitalist services for admission.  Patient continues to refuse Covid swab.   [JS]    Clinical Course User Index [JS] Paulette Blanch, MD     ____________________________________________   FINAL CLINICAL IMPRESSION(S) / ED DIAGNOSES  Final diagnoses:  AKI (acute kidney injury) ([place])  Elevated troponin  Non-intractable vomiting with nausea, unspecified vomiting type  Fever, unspecified fever cause  Cough     ED Discharge Orders    None       Note:  This document was prepared using Dragon voice recognition software and may include unintentional dictation errors.   Paulette Blanch, MD 10/10/19 [place]

## 2019-10-10 ENCOUNTER — Other Ambulatory Visit: Payer: Self-pay

## 2019-10-10 ENCOUNTER — Encounter: Payer: Self-pay | Admitting: Family Medicine

## 2019-10-10 DIAGNOSIS — Z9861 Coronary angioplasty status: Secondary | ICD-10-CM | POA: Diagnosis not present

## 2019-10-10 DIAGNOSIS — I5022 Chronic systolic (congestive) heart failure: Secondary | ICD-10-CM | POA: Diagnosis present

## 2019-10-10 DIAGNOSIS — D696 Thrombocytopenia, unspecified: Secondary | ICD-10-CM | POA: Diagnosis present

## 2019-10-10 DIAGNOSIS — Z79899 Other long term (current) drug therapy: Secondary | ICD-10-CM | POA: Diagnosis not present

## 2019-10-10 DIAGNOSIS — A419 Sepsis, unspecified organism: Secondary | ICD-10-CM

## 2019-10-10 DIAGNOSIS — A4151 Sepsis due to Escherichia coli [E. coli]: Secondary | ICD-10-CM | POA: Diagnosis present

## 2019-10-10 DIAGNOSIS — I248 Other forms of acute ischemic heart disease: Secondary | ICD-10-CM | POA: Diagnosis present

## 2019-10-10 DIAGNOSIS — E1121 Type 2 diabetes mellitus with diabetic nephropathy: Secondary | ICD-10-CM

## 2019-10-10 DIAGNOSIS — R7881 Bacteremia: Secondary | ICD-10-CM | POA: Diagnosis not present

## 2019-10-10 DIAGNOSIS — N179 Acute kidney failure, unspecified: Secondary | ICD-10-CM | POA: Diagnosis present

## 2019-10-10 DIAGNOSIS — Z20822 Contact with and (suspected) exposure to covid-19: Secondary | ICD-10-CM | POA: Diagnosis present

## 2019-10-10 DIAGNOSIS — I429 Cardiomyopathy, unspecified: Secondary | ICD-10-CM | POA: Diagnosis present

## 2019-10-10 DIAGNOSIS — Z87891 Personal history of nicotine dependence: Secondary | ICD-10-CM | POA: Diagnosis not present

## 2019-10-10 DIAGNOSIS — N1832 Chronic kidney disease, stage 3b: Secondary | ICD-10-CM | POA: Diagnosis present

## 2019-10-10 DIAGNOSIS — Z85828 Personal history of other malignant neoplasm of skin: Secondary | ICD-10-CM | POA: Diagnosis not present

## 2019-10-10 DIAGNOSIS — I130 Hypertensive heart and chronic kidney disease with heart failure and stage 1 through stage 4 chronic kidney disease, or unspecified chronic kidney disease: Secondary | ICD-10-CM | POA: Diagnosis present

## 2019-10-10 DIAGNOSIS — I2510 Atherosclerotic heart disease of native coronary artery without angina pectoris: Secondary | ICD-10-CM | POA: Diagnosis present

## 2019-10-10 DIAGNOSIS — Z8546 Personal history of malignant neoplasm of prostate: Secondary | ICD-10-CM | POA: Diagnosis not present

## 2019-10-10 DIAGNOSIS — R778 Other specified abnormalities of plasma proteins: Secondary | ICD-10-CM

## 2019-10-10 DIAGNOSIS — R6520 Severe sepsis without septic shock: Secondary | ICD-10-CM

## 2019-10-10 DIAGNOSIS — Z8616 Personal history of COVID-19: Secondary | ICD-10-CM | POA: Diagnosis not present

## 2019-10-10 DIAGNOSIS — R188 Other ascites: Secondary | ICD-10-CM | POA: Diagnosis present

## 2019-10-10 DIAGNOSIS — E876 Hypokalemia: Secondary | ICD-10-CM | POA: Diagnosis not present

## 2019-10-10 DIAGNOSIS — E1122 Type 2 diabetes mellitus with diabetic chronic kidney disease: Secondary | ICD-10-CM | POA: Diagnosis present

## 2019-10-10 DIAGNOSIS — Z7984 Long term (current) use of oral hypoglycemic drugs: Secondary | ICD-10-CM | POA: Diagnosis not present

## 2019-10-10 DIAGNOSIS — R0902 Hypoxemia: Secondary | ICD-10-CM | POA: Diagnosis present

## 2019-10-10 DIAGNOSIS — Z7901 Long term (current) use of anticoagulants: Secondary | ICD-10-CM | POA: Diagnosis not present

## 2019-10-10 DIAGNOSIS — I252 Old myocardial infarction: Secondary | ICD-10-CM | POA: Diagnosis not present

## 2019-10-10 DIAGNOSIS — R05 Cough: Secondary | ICD-10-CM | POA: Diagnosis present

## 2019-10-10 DIAGNOSIS — Z8551 Personal history of malignant neoplasm of bladder: Secondary | ICD-10-CM | POA: Diagnosis not present

## 2019-10-10 LAB — BASIC METABOLIC PANEL
Anion gap: 11 (ref 5–15)
BUN: 44 mg/dL — ABNORMAL HIGH (ref 8–23)
CO2: 16 mmol/L — ABNORMAL LOW (ref 22–32)
Calcium: 8.3 mg/dL — ABNORMAL LOW (ref 8.9–10.3)
Chloride: 111 mmol/L (ref 98–111)
Creatinine, Ser: 2.42 mg/dL — ABNORMAL HIGH (ref 0.61–1.24)
GFR calc Af Amer: 31 mL/min — ABNORMAL LOW (ref >60)
GFR calc non Af Amer: 27 mL/min — ABNORMAL LOW (ref >60)
Glucose, Bld: 90 mg/dL (ref 70–99)
Potassium: 3.2 mmol/L — ABNORMAL LOW (ref 3.5–5.1)
Sodium: 138 mmol/L (ref 135–145)

## 2019-10-10 LAB — CBC WITH DIFFERENTIAL/PLATELET
Abs Immature Granulocytes: 0.09 10*3/uL — ABNORMAL HIGH (ref 0.00–0.07)
Basophils Absolute: 0 10*3/uL (ref 0.0–0.1)
Basophils Relative: 0 %
Eosinophils Absolute: 0 10*3/uL (ref 0.0–0.5)
Eosinophils Relative: 0 %
HCT: 37.2 % — ABNORMAL LOW (ref 39.0–52.0)
Hemoglobin: 11.6 g/dL — ABNORMAL LOW (ref 13.0–17.0)
Immature Granulocytes: 1 %
Lymphocytes Relative: 1 %
Lymphs Abs: 0.1 10*3/uL — ABNORMAL LOW (ref 0.7–4.0)
MCH: 27.3 pg (ref 26.0–34.0)
MCHC: 31.2 g/dL (ref 30.0–36.0)
MCV: 87.5 fL (ref 80.0–100.0)
Monocytes Absolute: 0.6 10*3/uL (ref 0.1–1.0)
Monocytes Relative: 5 %
Neutro Abs: 10.8 10*3/uL — ABNORMAL HIGH (ref 1.7–7.7)
Neutrophils Relative %: 93 %
Platelets: 122 10*3/uL — ABNORMAL LOW (ref 150–400)
RBC: 4.25 MIL/uL (ref 4.22–5.81)
RDW: 20.3 % — ABNORMAL HIGH (ref 11.5–15.5)
WBC: 11.7 10*3/uL — ABNORMAL HIGH (ref 4.0–10.5)
nRBC: 0 % (ref 0.0–0.2)

## 2019-10-10 LAB — URINALYSIS, COMPLETE (UACMP) WITH MICROSCOPIC
Bacteria, UA: NONE SEEN
Bilirubin Urine: NEGATIVE
Glucose, UA: NEGATIVE mg/dL
Ketones, ur: NEGATIVE mg/dL
Nitrite: NEGATIVE
Protein, ur: 100 mg/dL — AB
Specific Gravity, Urine: 1.01 (ref 1.005–1.030)
Squamous Epithelial / HPF: NONE SEEN (ref 0–5)
WBC, UA: NONE SEEN WBC/hpf (ref 0–5)
pH: 8 (ref 5.0–8.0)

## 2019-10-10 LAB — BLOOD CULTURE ID PANEL (REFLEXED)

## 2019-10-10 LAB — SARS CORONAVIRUS 2 BY RT PCR (HOSPITAL ORDER, PERFORMED IN ~~LOC~~ HOSPITAL LAB): SARS Coronavirus 2: NEGATIVE

## 2019-10-10 LAB — PROCALCITONIN
Procalcitonin: 0.54 ng/mL
Procalcitonin: 1.05 ng/mL

## 2019-10-10 LAB — MAGNESIUM: Magnesium: 1.6 mg/dL — ABNORMAL LOW (ref 1.7–2.4)

## 2019-10-10 LAB — TROPONIN I (HIGH SENSITIVITY)
Troponin I (High Sensitivity): 73 ng/L — ABNORMAL HIGH (ref <18)
Troponin I (High Sensitivity): 72 ng/L — ABNORMAL HIGH (ref <18)

## 2019-10-10 LAB — GLUCOSE, CAPILLARY
Glucose-Capillary: 89 mg/dL (ref 70–99)
Glucose-Capillary: 98 mg/dL (ref 70–99)
Glucose-Capillary: 79 mg/dL (ref 70–99)

## 2019-10-10 LAB — HEMOGLOBIN A1C
Hgb A1c MFr Bld: 5.7 % — ABNORMAL HIGH (ref 4.8–5.6)
Mean Plasma Glucose: 116.89 mg/dL

## 2019-10-10 MED ORDER — SODIUM CHLORIDE 0.9 % IV SOLN
2.0000 g | Freq: Two times a day (BID) | INTRAVENOUS | Status: DC
  Filled 2019-10-10: qty 2

## 2019-10-10 MED ORDER — APIXABAN 5 MG PO TABS
5.0000 mg | ORAL_TABLET | Freq: Two times a day (BID) | ORAL | Status: DC
  Administered 2019-10-10 – 2019-10-12 (×5): 5 mg via ORAL
  Filled 2019-10-10 (×6): qty 1

## 2019-10-10 MED ORDER — VANCOMYCIN HCL 1750 MG/350ML IV SOLN
1750.0000 mg | Freq: Once | INTRAVENOUS | Status: AC
  Administered 2019-10-10: 1750 mg via INTRAVENOUS
  Filled 2019-10-10 (×2): qty 350

## 2019-10-10 MED ORDER — ONDANSETRON HCL 4 MG/2ML IJ SOLN
4.0000 mg | Freq: Four times a day (QID) | INTRAMUSCULAR | Status: DC | PRN
  Administered 2019-10-11: 4 mg via INTRAVENOUS
  Filled 2019-10-10: qty 2

## 2019-10-10 MED ORDER — PIPERACILLIN-TAZOBACTAM 3.375 G IVPB 30 MIN
3.3750 g | Freq: Once | INTRAVENOUS | Status: AC
  Administered 2019-10-10: 3.375 g via INTRAVENOUS
  Filled 2019-10-10: qty 50

## 2019-10-10 MED ORDER — SODIUM CHLORIDE 0.9 % IV SOLN
1.0000 g | Freq: Two times a day (BID) | INTRAVENOUS | Status: DC
  Administered 2019-10-10 – 2019-10-12 (×4): 1 g via INTRAVENOUS
  Filled 2019-10-10 (×7): qty 1

## 2019-10-10 MED ORDER — ACETAMINOPHEN 650 MG RE SUPP: 650.0000 mg | Freq: Four times a day (QID) | RECTAL | Status: DC | PRN

## 2019-10-10 MED ORDER — SODIUM CHLORIDE 0.9 % IV SOLN
250.0000 mL | INTRAVENOUS | Status: DC | PRN
  Administered 2019-10-10 – 2019-10-12 (×4): 250 mL via INTRAVENOUS

## 2019-10-10 MED ORDER — SODIUM CHLORIDE 0.9% FLUSH: 3.0000 mL | Freq: Two times a day (BID) | INTRAVENOUS | Status: DC

## 2019-10-10 MED ORDER — METRONIDAZOLE IN NACL 5-0.79 MG/ML-% IV SOLN
500.0000 mg | Freq: Three times a day (TID) | INTRAVENOUS | Status: DC
  Administered 2019-10-10: 500 mg via INTRAVENOUS
  Filled 2019-10-10 (×3): qty 100

## 2019-10-10 MED ORDER — INSULIN ASPART 100 UNIT/ML ~~LOC~~ SOLN
0.0000 [IU] | Freq: Three times a day (TID) | SUBCUTANEOUS | Status: DC
  Filled 2019-10-10: qty 1

## 2019-10-10 MED ORDER — CARVEDILOL 25 MG PO TABS
25.0000 mg | ORAL_TABLET | Freq: Two times a day (BID) | ORAL | Status: DC
  Administered 2019-10-10 – 2019-10-12 (×5): 25 mg via ORAL
  Filled 2019-10-10 (×6): qty 1

## 2019-10-10 MED ORDER — SODIUM CHLORIDE 0.9% FLUSH
3.0000 mL | Freq: Two times a day (BID) | INTRAVENOUS | Status: DC
  Administered 2019-10-10 – 2019-10-12 (×4): 3 mL via INTRAVENOUS

## 2019-10-10 MED ORDER — VANCOMYCIN HCL IN DEXTROSE 1-5 GM/200ML-% IV SOLN: 1000.0000 mg | Freq: Once | INTRAVENOUS | Status: DC

## 2019-10-10 MED ORDER — ACETAMINOPHEN 325 MG PO TABS: 650.0000 mg | ORAL_TABLET | Freq: Four times a day (QID) | ORAL | Status: DC | PRN

## 2019-10-10 MED ORDER — SENNOSIDES-DOCUSATE SODIUM 8.6-50 MG PO TABS: 1.0000 | ORAL_TABLET | Freq: Every evening | ORAL | Status: DC | PRN

## 2019-10-10 MED ORDER — FUROSEMIDE 20 MG PO TABS
20.0000 mg | ORAL_TABLET | Freq: Every day | ORAL | Status: DC
  Administered 2019-10-10 – 2019-10-11 (×2): 20 mg via ORAL
  Filled 2019-10-10 (×2): qty 1

## 2019-10-10 MED ORDER — POTASSIUM CHLORIDE CRYS ER 20 MEQ PO TBCR
40.0000 meq | EXTENDED_RELEASE_TABLET | Freq: Once | ORAL | Status: AC
  Administered 2019-10-10: 40 meq via ORAL
  Filled 2019-10-10: qty 2

## 2019-10-10 MED ORDER — VANCOMYCIN HCL IN DEXTROSE 1-5 GM/200ML-% IV SOLN: 1000.0000 mg | INTRAVENOUS | Status: DC

## 2019-10-10 MED ORDER — ATORVASTATIN CALCIUM 10 MG PO TABS
10.0000 mg | ORAL_TABLET | Freq: Every day | ORAL | Status: DC
  Administered 2019-10-10 – 2019-10-11 (×2): 10 mg via ORAL
  Filled 2019-10-10 (×3): qty 1

## 2019-10-10 MED ORDER — SODIUM CHLORIDE 0.9 % IV BOLUS
1000.0000 mL | Freq: Once | INTRAVENOUS | Status: AC
  Administered 2019-10-10: 1000 mL via INTRAVENOUS

## 2019-10-10 MED ORDER — ONDANSETRON HCL 4 MG PO TABS
4.0000 mg | ORAL_TABLET | Freq: Four times a day (QID) | ORAL | Status: DC | PRN
  Filled 2019-10-10 (×2): qty 1

## 2019-10-10 MED ORDER — SODIUM CHLORIDE 0.9% FLUSH
3.0000 mL | INTRAVENOUS | Status: DC | PRN
  Administered 2019-10-12: 3 mL via INTRAVENOUS

## 2019-10-10 MED ORDER — SODIUM CHLORIDE 0.9 % IV SOLN
2.0000 g | Freq: Two times a day (BID) | INTRAVENOUS | Status: DC
  Administered 2019-10-10: 2 g via INTRAVENOUS
  Filled 2019-10-10 (×2): qty 2

## 2019-10-10 NOTE — Progress Notes (Signed)
CRITICAL VALUE ALERT  Critical Value:  5 beats Vtach  Date & Time Notied:  10/10/2019 1313  Provider Notified: Lorella Nimrod  Orders Received/Actions taken: Pt stable, no signs of symptoms of distress

## 2019-10-10 NOTE — ED Notes (Signed)
Pt given water and ginger ale and updated on food tray to come.

## 2019-10-10 NOTE — H&P (Signed)
History and Physical    Larry Mann BPZ:[id number] DOB: 10/31/1950 DOA: 10/09/2019  PCP: Maryland Pink, MD   Patient coming from: Home   Chief Complaint: Fever, malaise, vomiting   HPI: Larry Mann is a 69 y.o. male with medical history significant for bladder cancer status post cystectomy with ilial conduit in 2015, CAD, cardiomyopathy with reduced EF, type 2 diabetes mellitus, and chronic kidney disease stage IIIb, presenting to the emergency department with fever and malaise.  Patient reports that he developed fevers the night of 10/08/2019, had a mild cough recently that seems to have essentially resolved, denies any shortness of breath, denies any abdominal pain at this time but reports some mild fleeting pain earlier near the left flank.  He denies any neck stiffness, headache, wounds, or rash.  He has had some nausea with a couple episodes of nonbloody vomiting and 1 loose stool.   ED Course: Upon arrival to the ED, patient is found to be afebrile, tachycardic to 108, saturating well on room air, and with stable blood pressure.  EKG features sinus rhythm with LVH and repolarization abnormality.  Chest x-rays negative for acute cardiopulmonary disease.  CT stone study is negative for obstruction or other acute process but notable for new ascites.  Troponin was elevated to 73.  Lactic acid reassuringly normal.  CBC with leukocytosis to 15,400 and slight thrombocytopenia.  Chemistry panel features a creatinine of 2.24, up from 1.80 several days earlier and 1.5 in June 2021.  Blood and urine cultures were collected in the ED, a liter of IV fluid was given as a bolus, and the patient was treated with Zosyn.  He refused Covid testing and was placed on isolation.  Review of Systems:  All other systems reviewed and apart from HPI, are negative.  Past Medical History:  Diagnosis Date  . Anginal pain ([place])   . Basal cell carcinoma 06/20/2009   R nose supratip  . Bladder cancer ([place])     . Cancer ([place])   . Chicken pox   . Chronic kidney disease   . Coronary artery disease   . Diabetes mellitus without complication ([place])   . Headache   . Hypertension   . Myocardial infarction ([place])   . Plantar fasciitis   . Prostate cancer [facility])     Past Surgical History:  Procedure Laterality Date  . CARDIOVERSION N/A 04/23/2017   Procedure: CARDIOVERSION;  Surgeon: Corey Skains, MD;  Location: [facility] ORS;  Service: Cardiovascular;  Laterality: N/A;  . CARDIOVERSION N/A 03/27/2018   Procedure: CARDIOVERSION (CATH LAB);  Surgeon: Corey Skains, MD;  Location: [facility] ORS;  Service: Cardiovascular;  Laterality: N/A;  . COLONOSCOPY    . COLONOSCOPY WITH PROPOFOL N/A 10/27/2014   Procedure: COLONOSCOPY WITH PROPOFOL;  Surgeon: Manya Silvas, MD;  Location: [facility] ENDOSCOPY;  Service: Endoscopy;  Laterality: N/A;  . CORONARY ANGIOPLASTY    . CYSTECTOMY    . HERNIA REPAIR    . LITHOTRIPSY    . MOHS SURGERY    . PROSTATE SURGERY      Social History:   reports that he quit smoking about 18 years ago. He has a 37.50 pack-year smoking history. He has never used smokeless tobacco. He reports current alcohol use. He reports that he does not use drugs.  No Known Allergies  Family History  Problem Relation Age of Onset  . Multiple myeloma Mother      Prior to Admission medications   Medication Sig Start Date  End Date Taking? Authorizing Provider  atorvastatin (LIPITOR) 20 MG tablet Take 10 mg by mouth daily.    Yes [provider]  carvedilol (COREG) 12.5 MG tablet Take 25 mg by mouth 2 (two) times daily with a meal.    Yes [provider]  dapagliflozin propanediol (FARXIGA) 10 MG TABS tablet Take 10 mg by mouth daily.  06/28/19 06/27/20 Yes [provider]  ELIQUIS 5 MG TABS tablet Take 5 mg by mouth 2 (two) times daily. 03/24/17  Yes [provider]  furosemide (LASIX) 40 MG tablet Take 20 mg by mouth daily. 07/21/19  Yes [provider]   glipiZIDE (GLUCOTROL XL) 5 MG 24 hr tablet Take 5 mg by mouth 2 (two) times daily.    Yes [provider]  metFORMIN (GLUCOPHAGE) 1000 MG tablet Take 1,000 mg by mouth 2 (two) times daily with a meal.   Yes [provider]  nitrofurantoin, macrocrystal-monohydrate, (MACROBID) 100 MG capsule Take 100 mg by mouth daily. 08/06/19  Yes [provider]  sacubitril-valsartan (ENTRESTO) 49-51 MG Take 1 tablet by mouth 2 (two) times daily.   Yes [provider]  vitamin B-12 (CYANOCOBALAMIN) 1000 MCG tablet Take 1,000 mcg by mouth daily. 09/02/19  Yes [provider]  fluticasone (FLONASE) 50 MCG/ACT nasal spray SHAKE LIQUID AND USE 1 SPRAY IN EACH NOSTRIL TWICE DAILY Patient not taking: Reported on 10/10/2019 09/20/19   [provider]    Physical Exam: Vitals:   10/10/19 0120 10/10/19 0150 10/10/19 0200 10/10/19 0319  BP:   128/80 (!) 133/81  Pulse: 95 95 92 100  Resp: 20 20 19 20   Temp:    99.1 F (37.3 C)  TempSrc:    Oral  SpO2: 98% 98% 94% 99%  Weight:      Height:        Constitutional: NAD, no diaphoresis or pallor  Eyes: PERTLA, lids and conjunctivae normal ENMT: Mucous membranes are moist. Posterior pharynx clear of any exudate or lesions.   Neck: normal, supple, no masses, no thyromegaly Respiratory:  no wheezing, no crackles. No accessory muscle use.  Cardiovascular: S1 & S2 heard, regular rate and rhythm. Pretibial pitting edema bilaterally.  Abdomen: No distension, no tenderness, soft. Bowel sounds active.  Musculoskeletal: no clubbing / cyanosis. No joint deformity upper and lower extremities.   Skin: no significant rashes, lesions, ulcers. Warm, dry, well-perfused. Neurologic: CN 2-12 grossly intact. Sensation intact Moving all extremities.  Psychiatric: Alert and oriented to person, place, and situation. Calm.    Labs and Imaging on Admission: I have personally reviewed following labs and imaging studies  CBC: Recent  Labs  Lab 10/09/19 1826  WBC 15.4*  NEUTROABS 14.4*  HGB 12.7*  HCT 41.2  MCV 87.5  PLT 166*   Basic Metabolic Panel: Recent Labs  Lab 10/09/19 1826  NA 141  K 3.5  CL 113*  CO2 18*  GLUCOSE 134*  BUN 40*  CREATININE 2.24*  CALCIUM 8.4*   GFR: Estimated Creatinine Clearance: 31.7 mL/min (A) (by C-G formula based on SCr of 2.24 mg/dL (H)). Liver Function Tests: Recent Labs  Lab 10/09/19 1826  AST 19  ALT 21  ALKPHOS 106  BILITOT 2.3*  PROT 6.5  ALBUMIN 3.8   No results for input(s): LIPASE, AMYLASE in the last 168 hours. No results for input(s): AMMONIA in the last 168 hours. Coagulation Profile: No results for input(s): INR, PROTIME in the last 168 hours. Cardiac Enzymes: No results for input(s): CKTOTAL,  CKMB, CKMBINDEX, TROPONINI in the last 168 hours. BNP (last 3 results) No results for input(s): PROBNP in the last 8760 hours. HbA1C: No results for input(s): HGBA1C in the last 72 hours. CBG: No results for input(s): GLUCAP in the last 168 hours. Lipid Profile: No results for input(s): CHOL, HDL, LDLCALC, TRIG, CHOLHDL, LDLDIRECT in the last 72 hours. Thyroid Function Tests: No results for input(s): TSH, T4TOTAL, FREET4, T3FREE, THYROIDAB in the last 72 hours. Anemia Panel: No results for input(s): VITAMINB12, FOLATE, FERRITIN, TIBC, IRON, RETICCTPCT in the last 72 hours. Urine analysis:    Component Value Date/Time   COLORURINE YELLOW (A) 10/10/2019 0031   APPEARANCEUR TURBID (A) 10/10/2019 0031   LABSPEC 1.010 10/10/2019 0031   PHURINE 8.0 10/10/2019 0031   GLUCOSEU NEGATIVE 10/10/2019 0031   HGBUR MODERATE (A) 10/10/2019 0031   BILIRUBINUR NEGATIVE 10/10/2019 0031   KETONESUR NEGATIVE 10/10/2019 0031   PROTEINUR 100 (A) 10/10/2019 0031   NITRITE NEGATIVE 10/10/2019 0031   LEUKOCYTESUR SMALL (A) 10/10/2019 0031   Sepsis Labs: @LABRCNTIP (procalcitonin:4,lacticidven:4) )No results found for this or any previous visit (from the past 240  hour(s)).   Radiological Exams on Admission: DG Chest 2 View  Result Date: 10/09/2019 CLINICAL DATA:  Cough and shortness of breath for 2 days EXAM: CHEST - 2 VIEW COMPARISON:  10/07/2012 FINDINGS: Cardiac shadow is enlarged. Defibrillator is noted. The lungs are well aerated bilaterally. No focal infiltrate or sizable effusion is seen. No bony abnormality is noted. IMPRESSION: No acute abnormality Electronically Signed   By: Inez Catalina M.D.   On: 10/09/2019 19:10   CT Renal Stone Study  Result Date: 10/10/2019 CLINICAL DATA:  Flank pain and fevers EXAM: CT ABDOMEN AND PELVIS WITHOUT CONTRAST TECHNIQUE: Multidetector CT imaging of the abdomen and pelvis was performed following the standard protocol without IV contrast. COMPARISON:  01/05/2011 FINDINGS: Lower chest: Small effusions are noted bilaterally right greater than left. No focal infiltrate is seen. Hepatobiliary: Gallbladder is well distended. Multiple dependent gallstones are seen. No pericholecystic fluid is noted. The liver demonstrates a normal appearance. Perihepatic fluid is noted consistent with ascites. Pancreas: Unremarkable. No pancreatic ductal dilatation or surrounding inflammatory changes. Spleen: Normal in size without focal abnormality. Adrenals/Urinary Tract: Adrenal glands are within normal limits. Kidneys demonstrate a normal appearance without obstructive change bilaterally. Lower pole renal stone is noted on the left measuring 8-9 mm. This is slightly smaller than that seen on the prior exam. Right renal cyst is again noted and stable. Mild fullness of the collecting systems is seen without obstructing stone. Bladder has been surgically removed. A ileal loop is noted in the right mid abdomen. Stomach/Bowel: Diverticular change of the colon is noted without evidence of diverticulitis. No obstructive or inflammatory changes of the colon are seen. The appendix is within normal limits. Small bowel and stomach are unremarkable with  the exception of postsurgical changes related to the ileal loop. Vascular/Lymphatic: Aortic atherosclerosis. No enlarged abdominal or pelvic lymph nodes. Reproductive: Prostate is not visualized consistent with a prior surgical history. Other: Free fluid is noted throughout the abdomen and pelvis. Some herniation of fluid through the ostomy loop in the right mid abdomen is noted. Musculoskeletal: No acute or significant osseous findings. IMPRESSION: New ascites. Cholelithiasis without complicating factors. Nonobstructing left renal stone. Ileal loop is seen without definitive obstructive change. Diverticulosis without diverticulitis. Small effusions. Electronically Signed   By: Inez Catalina M.D.   On: 10/10/2019 00:08    EKG: Independently reviewed.Sinus rhythm, LVH with repolarization  abnormality.   Assessment/Plan   1. Severe sepsis  - Presents with fever of 101 F, treated with APAP prior to arrival, and was found to have tachycardia, leukocytosis, and AKI on CKD  - CXR is clear and urine does no bacteria or wbc seen in urine; no wounds or meningismus; abdominal exam benign  - He refused COVID test  - Lactate reassuring normal and BP has been stable  - He reports similar sxs previously that developed to septic shock with long ICU admission at an outside hospital  - Blood and urine were cultured in ED, IVF bolus was administered, and he was started on broad-spectrum antibiotics  - Continue broad spectrum antibiotics for now, follow cultures and clinical course   2. Acute kidney injury superimposed on CKD IIIb  - SCr is 2.24 in ED, up from 1.80 eleven days earlier and and 1.5 in June 2021  - No evidence for obstruction on CT  - He was given a liter of IVF in ED  - Hold diuretics and Entresto initially, renally-dose medications, repeat chem panel in am   3. Elevated troponin; CAD  - Patient has hx of CAD and mild troponin elevation without chest pain  - Continue cardiac monitoring for now,  repeat troponin, continue statin and beta-blocker    4. Chronic systolic CHF  - Appears compensated  - He was given IVF in ED in light of increased creatinine and possible sepsis  - Hold diuretics and Entresto for now, follow daily wt and I/Os   5. Type II DM  - Check CBGs, use low-intensity SSI for now    DVT prophylaxis: Eliquis  Code Status: Full  Family Communication: Wife updated at bedside  Disposition Plan:  Patient is from: Home  Anticipated d/c is to: TBD Anticipated d/c date is: 2-3 days  Patient currently: Pending cultures and repeat troponin Consults called: None  Admission status: Inpatient     Vianne Bulls, MD [facility]  10/10/2019, 4:13 AM

## 2019-10-10 NOTE — Consult Note (Signed)
PHARMACY - PHYSICIAN COMMUNICATION CRITICAL VALUE ALERT - BLOOD CULTURE IDENTIFICATION (BCID)  Larry Mann is an 69 y.o. male who presented to [facility] on 10/09/2019 with a chief complaint of fever, malaise, vomiting   Assessment:  2 out 4 bottles (same set) growing GNR. BCID positive for Ecoli.  KPC negative. No source identified.   Name of physician (or Provider) Contacted: Dr. Reesa Chew  Current antibiotics: cefepime, vancomycin, and flagyl  Changes to prescribed antibiotics recommended: d/c all antibiotics and start meropenem.  Recommendations accepted by provider  Results for orders placed or performed during the hospital encounter of 10/09/19  Blood Culture ID Panel (Reflexed) (Collected: 10/09/2019  6:26 PM)  Result Value Ref Range   Enterococcus species NOT DETECTED NOT DETECTED   Listeria monocytogenes NOT DETECTED NOT DETECTED   Staphylococcus species NOT DETECTED NOT DETECTED   Staphylococcus aureus (BCID) NOT DETECTED NOT DETECTED   Streptococcus species NOT DETECTED NOT DETECTED   Streptococcus agalactiae NOT DETECTED NOT DETECTED   Streptococcus pneumoniae NOT DETECTED NOT DETECTED   Streptococcus pyogenes NOT DETECTED NOT DETECTED   Acinetobacter baumannii NOT DETECTED NOT DETECTED   Enterobacteriaceae species DETECTED (A) NOT DETECTED   Enterobacter cloacae complex NOT DETECTED NOT DETECTED   Escherichia coli DETECTED (A) NOT DETECTED   Klebsiella oxytoca NOT DETECTED NOT DETECTED   Klebsiella pneumoniae NOT DETECTED NOT DETECTED   Proteus species NOT DETECTED NOT DETECTED   Serratia marcescens NOT DETECTED NOT DETECTED   Carbapenem resistance NOT DETECTED NOT DETECTED   Haemophilus influenzae NOT DETECTED NOT DETECTED   Neisseria meningitidis NOT DETECTED NOT DETECTED   Pseudomonas aeruginosa NOT DETECTED NOT DETECTED   Candida albicans NOT DETECTED NOT DETECTED   Candida glabrata NOT DETECTED NOT DETECTED   Candida krusei NOT DETECTED NOT DETECTED   Candida  parapsilosis NOT DETECTED NOT DETECTED   Candida tropicalis NOT DETECTED NOT DETECTED    Oswald Hillock, PharmD, BCPS 10/10/2019  11:46 AM

## 2019-10-10 NOTE — Progress Notes (Signed)
PROGRESS NOTE    Larry Mann  GYK:[id number] DOB: [birth year] DOA: 10/09/2019 PCP: [place] Pink, MD   Brief Narrative:  Larry Mann is a 69 y.o. male with medical history significant for bladder cancer status post cystectomy with ilial conduit in 2015, CAD, cardiomyopathy with reduced EF, type 2 diabetes mellitus, and chronic kidney disease stage IIIb, presenting to the emergency department with fever and malaise, associated with nausea, vomiting and 1 loose stool. Patient is vaccinated and COVID-19 was negative. Blood cultures growing E. Coli.  Subjective: Patient was complaining of poor appetite, denies any more nausea or vomiting.  Having generalized malaise but no specific pain.  Assessment & Plan:   Principal Problem:   Severe sepsis ([place]) Active Problems:   Bladder cancer ([place])   Diabetes mellitus type 2, uncomplicated (HCC)   HTN (hypertension)   Acute renal failure superimposed on stage 3b chronic kidney disease (HCC)   Coronary artery disease   Cardiomyopathy ([place])   Elevated troponin  E. coli bacteremia.  Initially met sepsis criteria with fever, leukocytosis, tachycardia and AKI.  He was started on broad-spectrum antibiotics with Zosyn, vancomycin and Flagyl.  Later switched to cefepime, vancomycin and Flagyl on admission. Blood culture with gram-negative rods, E. coli identified. Urine cultures pending. -Switch antibiotics with meropenem according to protocol-we will de-escalate once susceptibility results are available.  Elevated troponin with history of CAD.  Patient has mildly elevated troponin in 70s with a flat curve.  Most likely demand.  No chest pain or acute change on EKG. -Continue home dose of statin and beta-blocker.  AKI with CKD stage IIIb.  Creatinine elevated at 2.42 with baseline around 1.6-1.8.  Patient did receive some IV fluid in ED. initially home dose of diuretic and Entresto was held due to concern of sepsis.  Patient appears volume  up. -Restart home diuretic. -Keep holding Entresto. -Avoid excessive IV fluid due to history of HFrEF. -Continue to monitor. -Avoid nephrotoxins.  Chronic HFrEF.  Patient with significant lower extremity edema.  Shortness of breath or hypoxia.  He did receive IV fluid in ED for possible sepsis. -Restart home diuretic. -Check BNP -Daily BMPs -Strict intake and output. -Daily weight.  Type 2 diabetes mellitus. -Continue SSI.  Chronic anticoagulation.  Patient is on Eliquis, could not find the reason.  When asked according to patient he was started on that medication by his cardiologist.  Unable to find any reason in cardiology note. -Continue Eliquis. -Patient needs to discuss with his cardiologist about the need of continuation of anticoagulation.  Objective: Vitals:   10/10/19 0200 10/10/19 0319 10/10/19 0500 10/10/19 0657  BP: 128/80 (!) 133/81  126/80  Pulse: 92 100  103  Resp: _0 Temp:  99.1 F (37.3 C)  98.4 F (36.9 C)  TempSrc:  Oral  Oral  SpO2: 94% 99%  96%  Weight:   78.9 kg   Height:        Intake/Output Summary (Last 24 hours) at 10/10/2019 1338 Last data filed at 10/10/2019 0658 Gross per 24 hour  Intake 205.03 ml  Output 525 ml  Net -319.97 ml   Filed Weights   10/09/19 1823 10/10/19 0500  Weight: 79.4 kg 78.9 kg    Examination:  General exam: Appears calm and comfortable  Respiratory system: Clear to auscultation. Respiratory effort normal. Cardiovascular system: S1 & S2 heard, RRR. No JVD, murmurs, rubs, gallops or clicks. Gastrointestinal system: Soft, nontender, nondistended, bowel sounds positive. Central nervous system: Alert and  oriented. No focal neurological deficits. Extremities: 2+ LE edema, no cyanosis, pulses intact and symmetrical. Psychiatry: Judgement and insight appear normal. Mood & affect appropriate.    DVT prophylaxis: Eliquis Code Status: Full Family Communication: Discussed with patient. Disposition Plan:  Status  is: Inpatient  Remains inpatient appropriate because:Inpatient level of care appropriate due to severity of illness   Dispo: The patient is from: Home              Anticipated d/c is to: Home              Anticipated d/c date is: 2 days              Patient currently is not medically stable to d/c.  Consultants:   None  Procedures:  Antimicrobials:  Meropenem  Data Reviewed: I have personally reviewed following labs and imaging studies  CBC: Recent Labs  Lab 10/09/19 1826 10/10/19 0630  WBC 15.4* 11.7*  NEUTROABS 14.4* 10.8*  HGB 12.7* 11.6*  HCT 41.2 37.2*  MCV 87.5 87.5  PLT 143* 076*   Basic Metabolic Panel: Recent Labs  Lab 10/09/19 1826 10/10/19 0630  NA 141 138  K 3.5 3.2*  CL 113* 111  CO2 18* 16*  GLUCOSE 134* 90  BUN 40* 44*  CREATININE 2.24* 2.42*  CALCIUM 8.4* 8.3*  MG  --  1.6*   GFR: Estimated Creatinine Clearance: 29.2 mL/min (A) (by C-G formula based on SCr of 2.42 mg/dL (H)). Liver Function Tests: Recent Labs  Lab 10/09/19 1826  AST 19  ALT 21  ALKPHOS 106  BILITOT 2.3*  PROT 6.5  ALBUMIN 3.8   No results for input(s): LIPASE, AMYLASE in the last 168 hours. No results for input(s): AMMONIA in the last 168 hours. Coagulation Profile: No results for input(s): INR, PROTIME in the last 168 hours. Cardiac Enzymes: No results for input(s): CKTOTAL, CKMB, CKMBINDEX, TROPONINI in the last 168 hours. BNP (last 3 results) No results for input(s): PROBNP in the last 8760 hours. HbA1C: No results for input(s): HGBA1C in the last 72 hours. CBG: Recent Labs  Lab 10/10/19 0923 10/10/19 1325  GLUCAP 89 98   Lipid Profile: No results for input(s): CHOL, HDL, LDLCALC, TRIG, CHOLHDL, LDLDIRECT in the last 72 hours. Thyroid Function Tests: No results for input(s): TSH, T4TOTAL, FREET4, T3FREE, THYROIDAB in the last 72 hours. Anemia Panel: No results for input(s): VITAMINB12, FOLATE, FERRITIN, TIBC, IRON, RETICCTPCT in the last 72  hours. Sepsis Labs: Recent Labs  Lab 10/09/19 1826 10/09/19 1906 10/10/19 0630  PROCALCITON 0.54  --  1.05  LATICACIDVEN  --  1.3  --     Recent Results (from the past 240 hour(s))  Blood Culture (routine x 2)     Status: None (Preliminary result)   Collection Time: 10/09/19  6:26 PM   Specimen: BLOOD  Result Value Ref Range Status   Specimen Description BLOOD LEFT AC  Final   Special Requests   Final    BOTTLES DRAWN AEROBIC AND ANAEROBIC Blood Culture adequate volume   Culture  Setup Time   Final    Organism ID to follow GRAM NEGATIVE RODS IN BOTH AEROBIC AND ANAEROBIC BOTTLES CRITICAL RESULT CALLED TO, READ BACK BY AND VERIFIED WITH:  Winfield Rast PATEL ON 10/10/2019 AT 1037 TIK Performed at [facility], [address], [place], [place] [address]    Culture GRAM NEGATIVE RODS  Final   Report Status PENDING  Incomplete  Blood Culture ID Panel (Reflexed)  Status: Abnormal   Collection Time: 10/09/19  6:26 PM  Result Value Ref Range Status   Enterococcus species NOT DETECTED NOT DETECTED Final   Listeria monocytogenes NOT DETECTED NOT DETECTED Final   Staphylococcus species NOT DETECTED NOT DETECTED Final   Staphylococcus aureus (BCID) NOT DETECTED NOT DETECTED Final   Streptococcus species NOT DETECTED NOT DETECTED Final   Streptococcus agalactiae NOT DETECTED NOT DETECTED Final   Streptococcus pneumoniae NOT DETECTED NOT DETECTED Final   Streptococcus pyogenes NOT DETECTED NOT DETECTED Final   Acinetobacter baumannii NOT DETECTED NOT DETECTED Final   Enterobacteriaceae species DETECTED (A) NOT DETECTED Final    Comment: Enterobacteriaceae represent a large family of gram-negative bacteria, not a single organism. CRITICAL RESULT CALLED TO, READ BACK BY AND VERIFIED WITH: KISHAN PATEL ON 10/10/19 AT 1037 TIK    Enterobacter cloacae complex NOT DETECTED NOT DETECTED Final   Escherichia coli DETECTED (A) NOT DETECTED Final    Comment: CRITICAL RESULT CALLED TO,  READ BACK BY AND VERIFIED WITH: KISHAN PATEL ON 10/10/19 AT 1037 TIK    Klebsiella oxytoca NOT DETECTED NOT DETECTED Final   Klebsiella pneumoniae NOT DETECTED NOT DETECTED Final   Proteus species NOT DETECTED NOT DETECTED Final   Serratia marcescens NOT DETECTED NOT DETECTED Final   Carbapenem resistance NOT DETECTED NOT DETECTED Final   Haemophilus influenzae NOT DETECTED NOT DETECTED Final   Neisseria meningitidis NOT DETECTED NOT DETECTED Final   Pseudomonas aeruginosa NOT DETECTED NOT DETECTED Final   Candida albicans NOT DETECTED NOT DETECTED Final   Candida glabrata NOT DETECTED NOT DETECTED Final   Candida krusei NOT DETECTED NOT DETECTED Final   Candida parapsilosis NOT DETECTED NOT DETECTED Final   Candida tropicalis NOT DETECTED NOT DETECTED Final    Comment: Performed at [facility], [address] [place], [place] [address]  Blood Culture (routine x 2)     Status: None (Preliminary result)   Collection Time: 10/10/19 12:31 AM   Specimen: BLOOD  Result Value Ref Range Status   Specimen Description BLOOD LEFT AC  Final   Special Requests   Final    BOTTLES DRAWN AEROBIC AND ANAEROBIC Blood Culture adequate volume   Culture   Final    NO GROWTH < 12 HOURS Performed at [facility], [address] [place], [place] [address]    Report Status PENDING  Incomplete  SARS Coronavirus 2 by RT PCR (hospital order, performed in [place] hospital lab) Nasopharyngeal Nasopharyngeal Swab     Status: None   Collection Time: 10/10/19 11:29 AM   Specimen: Nasopharyngeal Swab  Result Value Ref Range Status   SARS Coronavirus 2 NEGATIVE NEGATIVE Final    Comment: (NOTE) SARS-CoV-2 target nucleic acids are NOT DETECTED.  The SARS-CoV-2 RNA is generally detectable in upper and lower respiratory specimens during the acute phase of infection. The lowest concentration of SARS-CoV-2 viral copies this assay can detect is 250 copies / mL. A negative result does not  preclude SARS-CoV-2 infection and should not be used as the sole basis for treatment or other patient management decisions.  A negative result may occur with improper specimen collection / handling, submission of specimen other than nasopharyngeal swab, presence of viral mutation(s) within the areas targeted by this assay, and inadequate number of viral copies (<250 copies / mL). A negative result must be combined with clinical observations, patient history, and epidemiological information.  Fact Sheet for Patients:   StrictlyIdeas.no  Fact Sheet for Healthcare Providers: BankingDealers.co.za  This test is not yet approved or  cleared by the [place] and has been authorized for detection and/or diagnosis of SARS-CoV-2 by FDA under an Emergency Use Authorization (EUA).  This EUA will remain in effect (meaning this test can be used) for the duration of the COVID-19 declaration under Section 564(b)(1) of the Act, 21 U.S.C. section 360bbb-3(b)(1), unless the authorization is terminated or revoked sooner.  Performed at [facility], [address] [place], [place] [address]      Radiology Studies: DG Chest 2 View  Result Date: 10/09/2019 CLINICAL DATA:  Cough and shortness of breath for 2 days EXAM: CHEST - 2 VIEW COMPARISON:  10/07/2012 FINDINGS: Cardiac shadow is enlarged. Defibrillator is noted. The lungs are well aerated bilaterally. No focal infiltrate or sizable effusion is seen. No bony abnormality is noted. IMPRESSION: No acute abnormality Electronically Signed   By: Alcide Clever M.D.   On: 10/09/2019 19:10   CT Renal Stone Study  Result Date: 10/10/2019 CLINICAL DATA:  Flank pain and fevers EXAM: CT ABDOMEN AND PELVIS WITHOUT CONTRAST TECHNIQUE: Multidetector CT imaging of the abdomen and pelvis was performed following the standard protocol without IV contrast. COMPARISON:  01/05/2011 FINDINGS: Lower chest: Small  effusions are noted bilaterally right greater than left. No focal infiltrate is seen. Hepatobiliary: Gallbladder is well distended. Multiple dependent gallstones are seen. No pericholecystic fluid is noted. The liver demonstrates a normal appearance. Perihepatic fluid is noted consistent with ascites. Pancreas: Unremarkable. No pancreatic ductal dilatation or surrounding inflammatory changes. Spleen: Normal in size without focal abnormality. Adrenals/Urinary Tract: Adrenal glands are within normal limits. Kidneys demonstrate a normal appearance without obstructive change bilaterally. Lower pole renal stone is noted on the left measuring 8-9 mm. This is slightly smaller than that seen on the prior exam. Right renal cyst is again noted and stable. Mild fullness of the collecting systems is seen without obstructing stone. Bladder has been surgically removed. A ileal loop is noted in the right mid abdomen. Stomach/Bowel: Diverticular change of the colon is noted without evidence of diverticulitis. No obstructive or inflammatory changes of the colon are seen. The appendix is within normal limits. Small bowel and stomach are unremarkable with the exception of postsurgical changes related to the ileal loop. Vascular/Lymphatic: Aortic atherosclerosis. No enlarged abdominal or pelvic lymph nodes. Reproductive: Prostate is not visualized consistent with a prior surgical history. Other: Free fluid is noted throughout the abdomen and pelvis. Some herniation of fluid through the ostomy loop in the right mid abdomen is noted. Musculoskeletal: No acute or significant osseous findings. IMPRESSION: New ascites. Cholelithiasis without complicating factors. Nonobstructing left renal stone. Ileal loop is seen without definitive obstructive change. Diverticulosis without diverticulitis. Small effusions. Electronically Signed   By: Alcide Clever M.D.   On: 10/10/2019 00:08    Scheduled Meds: . apixaban  5 mg Oral BID  . atorvastatin   10 mg Oral Daily  . carvedilol  25 mg Oral BID WC  . insulin aspart  0-9 Units Subcutaneous TID WC  . sodium chloride flush  3 mL Intravenous Once  . sodium chloride flush  3 mL Intravenous Q12H   Continuous Infusions: . sodium chloride Stopped (10/10/19 0641)  . meropenem (MERREM) IV 1 g (10/10/19 1331)     LOS: 0 days   Time spent: 45 minutes.  I personally reviewed his chart and images.  Arnetha Courser, MD [facility]  If 7PM-7AM, please contact night-coverage [email or web address]  10/10/2019, 1:38 PM   This  record has been created using Systems analyst. Errors have been sought and corrected,but may not always be located. Such creation errors do not reflect on the standard of care.

## 2019-10-10 NOTE — Progress Notes (Signed)
Pt had a six beat run of [place] @ 0645. MD is aware. No new orders given. Will pass on to oncoming nurse.

## 2019-10-10 NOTE — Progress Notes (Signed)
CODE SEPSIS - PHARMACY COMMUNICATION  **Broad Spectrum Antibiotics should be administered within 1 hour of Sepsis diagnosis**  Time Code Sepsis Called/Page Received: 2325  Antibiotics Ordered: vanc/zosyn  Time of 1st antibiotic administration: 0119  Additional action taken by pharmacy:   If necessary, Name of Provider/Nurse Contacted:     Tobie Lords ,PharmD Clinical Pharmacist  10/10/2019  4:28 AM

## 2019-10-10 NOTE — Progress Notes (Signed)
Pharmacy Antibiotic Note  Larry Mann is a 69 y.o. male admitted on 10/09/2019 with sepsis w/ h/o bladder cancer s/p cystectomy w/ iliat conduit, CHF< CKD IIIb presenting w/ fever/malaise. Meeting 2/4 SIRS (tachycardia, WBC 15.4), PCT 0.54, left shift on differential, elevated kappa/lambda light chains; possible multiple myeloma??  Pharmacy has been consulted for vancomycin/cefepime dosing. Patient received vanc 1.75g IV load and cefepime 2g IV x 1.  Plan: Will plan to start vanc 1g IV q24h and continue cefepime 2g IV q12h per CrCl 30 - 60 ml/min.  Will continue to monitor renal function and clinical s/sx and will adjust doses per changes in renal function. Will not order levels for now as patient may not be on vanc long-term.  Ke 0.0307 T1/2 ~ 24 hours  Height: 5' 6"  (167.6 cm) Weight: 79.4 kg (175 lb) IBW/kg (Calculated) : 63.8  Temp (24hrs), Avg:99.1 F (37.3 C), Min:98.9 F (37.2 C), Max:99.2 F (37.3 C)  Recent Labs  Lab 10/09/19 1826 10/09/19 1906  WBC 15.4*  --   CREATININE 2.24*  --   LATICACIDVEN  --  1.3    Estimated Creatinine Clearance: 31.7 mL/min (A) (by C-G formula based on SCr of 2.24 mg/dL (H)).    No Known Allergies  Thank you for allowing pharmacy to be a part of this patient's care.  Tobie Lords, PharmD, BCPS Clinical Pharmacist 10/10/2019 6:33 AM

## 2019-10-11 DIAGNOSIS — E876 Hypokalemia: Secondary | ICD-10-CM

## 2019-10-11 DIAGNOSIS — R7881 Bacteremia: Secondary | ICD-10-CM

## 2019-10-11 LAB — CBC WITH DIFFERENTIAL/PLATELET
Abs Immature Granulocytes: 0.05 10*3/uL (ref 0.00–0.07)
Basophils Absolute: 0 10*3/uL (ref 0.0–0.1)
Basophils Relative: 0 %
Eosinophils Absolute: 0 10*3/uL (ref 0.0–0.5)
Eosinophils Relative: 0 %
HCT: 35.6 % — ABNORMAL LOW (ref 39.0–52.0)
Hemoglobin: 11.1 g/dL — ABNORMAL LOW (ref 13.0–17.0)
Immature Granulocytes: 1 %
Lymphocytes Relative: 3 %
Lymphs Abs: 0.2 10*3/uL — ABNORMAL LOW (ref 0.7–4.0)
MCH: 27.1 pg (ref 26.0–34.0)
MCHC: 31.2 g/dL (ref 30.0–36.0)
MCV: 87 fL (ref 80.0–100.0)
Monocytes Absolute: 0.5 10*3/uL (ref 0.1–1.0)
Monocytes Relative: 7 %
Neutro Abs: 6.4 10*3/uL (ref 1.7–7.7)
Neutrophils Relative %: 89 %
Platelets: 103 10*3/uL — ABNORMAL LOW (ref 150–400)
RBC: 4.09 MIL/uL — ABNORMAL LOW (ref 4.22–5.81)
RDW: 20.1 % — ABNORMAL HIGH (ref 11.5–15.5)
WBC: 7.2 10*3/uL (ref 4.0–10.5)
nRBC: 0 % (ref 0.0–0.2)

## 2019-10-11 LAB — GLUCOSE, CAPILLARY
Glucose-Capillary: 61 mg/dL — ABNORMAL LOW (ref 70–99)
Glucose-Capillary: 99 mg/dL (ref 70–99)
Glucose-Capillary: 99 mg/dL (ref 70–99)
Glucose-Capillary: 91 mg/dL (ref 70–99)
Glucose-Capillary: 86 mg/dL (ref 70–99)

## 2019-10-11 LAB — BASIC METABOLIC PANEL
Anion gap: 9 (ref 5–15)
BUN: 52 mg/dL — ABNORMAL HIGH (ref 8–23)
CO2: 19 mmol/L — ABNORMAL LOW (ref 22–32)
Calcium: 8 mg/dL — ABNORMAL LOW (ref 8.9–10.3)
Chloride: 113 mmol/L — ABNORMAL HIGH (ref 98–111)
Creatinine, Ser: 2.89 mg/dL — ABNORMAL HIGH (ref 0.61–1.24)
GFR calc Af Amer: 25 mL/min — ABNORMAL LOW (ref >60)
GFR calc non Af Amer: 21 mL/min — ABNORMAL LOW (ref >60)
Glucose, Bld: 60 mg/dL — ABNORMAL LOW (ref 70–99)
Potassium: 3.3 mmol/L — ABNORMAL LOW (ref 3.5–5.1)
Sodium: 141 mmol/L (ref 135–145)

## 2019-10-11 LAB — PROCALCITONIN: Procalcitonin: 1.56 ng/mL

## 2019-10-11 LAB — URINE CULTURE

## 2019-10-11 MED ORDER — GUAIFENESIN ER 600 MG PO TB12
600.0000 mg | ORAL_TABLET | Freq: Two times a day (BID) | ORAL | Status: DC
  Administered 2019-10-11 – 2019-10-12 (×2): 600 mg via ORAL
  Filled 2019-10-11 (×3): qty 1

## 2019-10-11 MED ORDER — ENSURE ENLIVE PO LIQD
237.0000 mL | Freq: Three times a day (TID) | ORAL | Status: DC
  Administered 2019-10-11 – 2019-10-12 (×3): 237 mL via ORAL

## 2019-10-11 NOTE — Evaluation (Signed)
Physical Therapy Evaluation Patient Details Name: Larry Mann MRN: [id number] DOB: September 24, 1950 Today's Date: 10/11/2019   History of Present Illness  Pt is a 69 y.o. male presenting to hospital 7/31 with fever, vomiting, and diarrhea.  Pt admitted with severe sepsis, AKI superimposed on CKD IIIb, elevated troponin (most likely demand ischemia per MD note), and chronic systolic CHF.  PMH includes bladder CA s/p urostomy, CKD, DM, htn, anginal pain, MI, plantar fasciitis, cardioversion, h/o MOHS surgery.  Clinical Impression  Prior to hospital admission, pt was independent with ambulation; lives with his wife in 1 level home with 3 STE.  Currently pt is independent with transfers and ambulation around nursing loop; SBA with navigating 4 steps (no railing).  Pt appears to be at baseline level of functional mobility per pt description; no acute PT needs identified (will sign off).      Follow Up Recommendations No PT follow up    Equipment Recommendations  None recommended by PT    Recommendations for Other Services       Precautions / Restrictions Precautions Precautions: Fall Precaution Comments: R UQ urostomy bag Restrictions Weight Bearing Restrictions: No      Mobility  Bed Mobility               General bed mobility comments: Deferred (pt sitting on edge of bed upon PT arrival; pt sitting in recliner end of session)  Transfers Overall transfer level: Independent Equipment used: None             General transfer comment: steady safe transfers noted  Ambulation/Gait Ambulation/Gait assistance: Independent Gait Distance (Feet): 250 Feet Assistive device: None Gait Pattern/deviations: Step-through pattern;WFL(Within Functional Limits)     General Gait Details: steady ambulation  Stairs Stairs: Yes Stairs assistance: Supervision Stair Management: No rails;Forwards Number of Stairs: 4 General stair comments: alternating pattern ascending; step to  pattern descending  Wheelchair Mobility    Modified Rankin (Stroke Patients Only)       Balance Overall balance assessment: Independent Sitting-balance support: No upper extremity supported;Feet supported Sitting balance-Leahy Scale: Normal Sitting balance - Comments: steady sitting reaching outside BOS   Standing balance support: No upper extremity supported;During functional activity Standing balance-Leahy Scale: Normal Standing balance comment: no loss of balance with functional mobility                             Pertinent Vitals/Pain Pain Assessment: No/denies pain     Home Living Family/patient expects to be discharged to:: Private residence Living Arrangements: Spouse/significant other Available Help at Discharge: Family Type of Home: House Home Access: Stairs to enter Entrance Stairs-Rails: None Technical brewer of Steps: 3 Home Layout: One level Home Equipment: Civil engineer, contracting - built in      Prior Function Level of Independence: Independent               Journalist, newspaper        Extremity/Trunk Assessment   Upper Extremity Assessment Upper Extremity Assessment: Overall WFL for tasks assessed    Lower Extremity Assessment Lower Extremity Assessment: Overall WFL for tasks assessed    Cervical / Trunk Assessment Cervical / Trunk Assessment: Normal  Communication   Communication: No difficulties  Cognition Arousal/Alertness: Awake/alert Behavior During Therapy: WFL for tasks assessed/performed Overall Cognitive Status: Within Functional Limits for tasks assessed  General Comments   Nursing cleared pt for participation in physical therapy.  Pt agreeable to PT session.  Pt sitting on edge of bed (bed alarm NOT on) upon PT arrival; pt reporting he has been ambulating to bathroom and in his room on his own without difficulty.    Exercises     Assessment/Plan    PT Assessment  Patent does not need any further PT services  PT Problem List         PT Treatment Interventions      PT Goals (Current goals can be found in the Care Plan section)  Acute Rehab PT Goals Patient Stated Goal: to go home PT Goal Formulation: With patient Time For Goal Achievement: 10/25/19 Potential to Achieve Goals: Good    Frequency     Barriers to discharge        Co-evaluation               AM-PAC PT "6 Clicks" Mobility  Outcome Measure Help needed turning from your back to your side while in a flat bed without using bedrails?: None Help needed moving from lying on your back to sitting on the side of a flat bed without using bedrails?: None Help needed moving to and from a bed to a chair (including a wheelchair)?: None Help needed standing up from a chair using your arms (e.g., wheelchair or bedside chair)?: None Help needed to walk in hospital room?: None Help needed climbing 3-5 steps with a railing? : A Little 6 Click Score: 23    End of Session Equipment Utilized During Treatment: Gait belt (up high away from urostomy bag) Activity Tolerance: Patient tolerated treatment well Patient left: in chair;with call bell/phone within reach;Other (comment) (pt refusing chair alarm--nurse notified) Nurse Communication: Mobility status;Precautions (pt refusing chair alarm) PT Visit Diagnosis: Muscle weakness (generalized) (M62.81)    Time: 1028-1050 PT Time Calculation (min) (ACUTE ONLY): 22 min   Charges:   PT Evaluation $PT Eval Low Complexity: 1 Low         Egidio Lofgren, PT 10/11/19, 11:25 AM

## 2019-10-11 NOTE — Progress Notes (Signed)
Patient reports he feels as if his BG is low. He reports not having an appetite earlier today due to nausea. BG at 86. Patient requested regular ginger ale. Provided to patient as requested.

## 2019-10-11 NOTE — Progress Notes (Signed)
Patient refused low beds and mats. Education given regarding home meds increases bleeding risk and mats and bed reduces injury. Patient assumes fall risks.

## 2019-10-11 NOTE — Progress Notes (Signed)
Pt worked with patient and refused bed alarm. Patient is steady on feet and no recommendations from therapy. Low bed recommendation still apply

## 2019-10-11 NOTE — Progress Notes (Addendum)
OT Screen Note  Patient Details Name: Larry Mann MRN: [id number] DOB: January 19, 1951   Cancelled Treatment:    Reason Eval/Treat Not Completed: OT screened, no needs identified, will sign off. Consult received, chart reviewed. Met with pt. No skilled OT needs. Pt in agreement. Will sign off. Please re-consult if additional needs arise.   Jeni Salles, MPH, MS, OTR/L ascom [phone] 10/11/19, 2:26 PM

## 2019-10-11 NOTE — Progress Notes (Signed)
PROGRESS NOTE    Larry Mann  IEP:[id number] DOB: 15-Feb-1951 DOA: 10/09/2019 PCP: Maryland Pink, MD    Assessment & Plan:   Principal Problem:   Severe sepsis [facility]) Active Problems:   Bladder cancer ([place])   Diabetes mellitus type 2, uncomplicated ([place])   HTN (hypertension)   Acute renal failure superimposed on stage 3b chronic kidney disease ([place])   Coronary artery disease   Cardiomyopathy ([place])   Elevated troponin   E. coli bacteremia: continue on IV merem. Blood cxs sens pending. Repeat blood cx NGTD  CAD: w/ elevated troponins. Likely secondary to demand ischemia.  No chest pain or acute change on EKG. Continue on home dose of statin, carvedilol  AKI on CKDIIIb: baseline Cr around 1.6-1.8. Cr is trending up today. Avoid nephrotoxic meds. Will hold lasix tomorrow if Cr continues to trend up  Hypokalemia: KCl repleted. Will continue to monitor   Chronic systolic CHF: continue on home dose of lasix. Monitor I/Os.   DM2: continue on SSI w/ accuchecks   Chronic anticoagulation: etiology unclear, put on this by the pt's cardiologist for unknown reason. Continue on eliquis. Will need to further discuss w/ his cardiologist  Thrombocytopenia: etiology unclear. Will continue to monitor    DVT prophylaxis: eliquis Code Status: full  Family Communication: discussed pt's care w/ pt's wife, and answered her questions Disposition Plan: d/c back home   Consultants:      Procedures:   Antimicrobials: merem   Subjective: Pt c/o malaise   Objective: Vitals:   10/10/19 0657 10/10/19 1445 10/11/19 0017 10/11/19 0500  BP: 126/80 108/71 105/67   Pulse: 103 97 82   Resp: 20  18   Temp: 98.4 F (36.9 C)  98 F (36.7 C)   TempSrc: Oral  Oral   SpO2: 96% 98% 97%   Weight:    79.7 kg  Height:        Intake/Output Summary (Last 24 hours) at 10/11/2019 0751 Last data filed at 10/11/2019 0018 Gross per 24 hour  Intake 200 ml  Output 500 ml  Net -300 ml    Filed Weights   10/09/19 1823 10/10/19 0500 10/11/19 0500  Weight: 79.4 kg 78.9 kg 79.7 kg    Examination:  General exam: Appears calm and comfortable  Respiratory system: Clear to auscultation. No wheezes, rales Cardiovascular system: S1 & S2 +. No rubs, gallops or clicks.  Gastrointestinal system: Abdomen is nondistended, soft and nontender. Hypoactive bowel sounds heard. Central nervous system: Alert and oriented. No focal neurological deficits. Psychiatry: Judgement and insight appear normal. Mood & affect appropriate.     Data Reviewed: I have personally reviewed following labs and imaging studies  CBC: Recent Labs  Lab 10/09/19 1826 10/10/19 0630 10/11/19 0523  WBC 15.4* 11.7* 7.2  NEUTROABS 14.4* 10.8* 6.4  HGB 12.7* 11.6* 11.1*  HCT 41.2 37.2* 35.6*  MCV 87.5 87.5 87.0  PLT 143* 122* 660*   Basic Metabolic Panel: Recent Labs  Lab 10/09/19 1826 10/10/19 0630 10/11/19 0523  NA 141 138 141  K 3.5 3.2* 3.3*  CL 113* 111 113*  CO2 18* 16* 19*  GLUCOSE 134* 90 60*  BUN 40* 44* 52*  CREATININE 2.24* 2.42* 2.89*  CALCIUM 8.4* 8.3* 8.0*  MG  --  1.6*  --    GFR: Estimated Creatinine Clearance: 24.6 mL/min (A) (by C-G formula based on SCr of 2.89 mg/dL (H)). Liver Function Tests: Recent Labs  Lab 10/09/19 1826  AST 19  ALT 21  ALKPHOS 106  BILITOT 2.3*  PROT 6.5  ALBUMIN 3.8   No results for input(s): LIPASE, AMYLASE in the last 168 hours. No results for input(s): AMMONIA in the last 168 hours. Coagulation Profile: No results for input(s): INR, PROTIME in the last 168 hours. Cardiac Enzymes: No results for input(s): CKTOTAL, CKMB, CKMBINDEX, TROPONINI in the last 168 hours. BNP (last 3 results) No results for input(s): PROBNP in the last 8760 hours. HbA1C: Recent Labs    10/10/19 0630  HGBA1C 5.7*   CBG: Recent Labs  Lab 10/10/19 0923 10/10/19 1325 10/10/19 2044  GLUCAP 89 98 79   Lipid Profile: No results for input(s): CHOL, HDL,  LDLCALC, TRIG, CHOLHDL, LDLDIRECT in the last 72 hours. Thyroid Function Tests: No results for input(s): TSH, T4TOTAL, FREET4, T3FREE, THYROIDAB in the last 72 hours. Anemia Panel: No results for input(s): VITAMINB12, FOLATE, FERRITIN, TIBC, IRON, RETICCTPCT in the last 72 hours. Sepsis Labs: Recent Labs  Lab 10/09/19 1826 10/09/19 1906 10/10/19 0630 10/11/19 0523  PROCALCITON 0.54  --  1.05 1.56  LATICACIDVEN  --  1.3  --   --     Recent Results (from the past 240 hour(s))  Blood Culture (routine x 2)     Status: None (Preliminary result)   Collection Time: 10/09/19  6:26 PM   Specimen: BLOOD  Result Value Ref Range Status   Specimen Description BLOOD LEFT AC  Final   Special Requests   Final    BOTTLES DRAWN AEROBIC AND ANAEROBIC Blood Culture adequate volume   Culture  Setup Time   Final    Organism ID to follow GRAM NEGATIVE RODS IN BOTH AEROBIC AND ANAEROBIC BOTTLES CRITICAL RESULT CALLED TO, READ BACK BY AND VERIFIED WITH:  KISHAN PATEL ON 10/10/2019 AT 1037 TIK Performed at [facility], [address] [place], [place] [address]    Culture GRAM NEGATIVE RODS  Final   Report Status PENDING  Incomplete  Blood Culture ID Panel (Reflexed)     Status: Abnormal   Collection Time: 10/09/19  6:26 PM  Result Value Ref Range Status   Enterococcus species NOT DETECTED NOT DETECTED Final   Listeria monocytogenes NOT DETECTED NOT DETECTED Final   Staphylococcus species NOT DETECTED NOT DETECTED Final   Staphylococcus aureus (BCID) NOT DETECTED NOT DETECTED Final   Streptococcus species NOT DETECTED NOT DETECTED Final   Streptococcus agalactiae NOT DETECTED NOT DETECTED Final   Streptococcus pneumoniae NOT DETECTED NOT DETECTED Final   Streptococcus pyogenes NOT DETECTED NOT DETECTED Final   Acinetobacter baumannii NOT DETECTED NOT DETECTED Final   Enterobacteriaceae species DETECTED (A) NOT DETECTED Final    Comment: Enterobacteriaceae represent a large family of  gram-negative bacteria, not a single organism. CRITICAL RESULT CALLED TO, READ BACK BY AND VERIFIED WITH: KISHAN PATEL ON 10/10/19 AT 1037 TIK    Enterobacter cloacae complex NOT DETECTED NOT DETECTED Final   Escherichia coli DETECTED (A) NOT DETECTED Final    Comment: CRITICAL RESULT CALLED TO, READ BACK BY AND VERIFIED WITH: KISHAN PATEL ON 10/10/19 AT 1037 TIK    Klebsiella oxytoca NOT DETECTED NOT DETECTED Final   Klebsiella pneumoniae NOT DETECTED NOT DETECTED Final   Proteus species NOT DETECTED NOT DETECTED Final   Serratia marcescens NOT DETECTED NOT DETECTED Final   Carbapenem resistance NOT DETECTED NOT DETECTED Final   Haemophilus influenzae NOT DETECTED NOT DETECTED Final   Neisseria meningitidis NOT DETECTED NOT DETECTED Final   Pseudomonas aeruginosa NOT DETECTED NOT DETECTED Final  Candida albicans NOT DETECTED NOT DETECTED Final   Candida glabrata NOT DETECTED NOT DETECTED Final   Candida krusei NOT DETECTED NOT DETECTED Final   Candida parapsilosis NOT DETECTED NOT DETECTED Final   Candida tropicalis NOT DETECTED NOT DETECTED Final    Comment: Performed at [facility], [address] [place], [place] [address]  Blood Culture (routine x 2)     Status: None (Preliminary result)   Collection Time: 10/10/19 12:31 AM   Specimen: BLOOD  Result Value Ref Range Status   Specimen Description BLOOD LEFT [facility]  Final   Special Requests   Final    BOTTLES DRAWN AEROBIC AND ANAEROBIC Blood Culture adequate volume   Culture   Final    NO GROWTH 1 DAY Performed at [facility], [address], [place], [place] [address]    Report Status PENDING  Incomplete  SARS Coronavirus 2 by RT PCR (hospital order, performed in [place] hospital lab) Nasopharyngeal Nasopharyngeal Swab     Status: None   Collection Time: 10/10/19 11:29 AM   Specimen: Nasopharyngeal Swab  Result Value Ref Range Status   SARS Coronavirus 2 NEGATIVE NEGATIVE Final    Comment:  (NOTE) SARS-CoV-2 target nucleic acids are NOT DETECTED.  The SARS-CoV-2 RNA is generally detectable in upper and lower respiratory specimens during the acute phase of infection. The lowest concentration of SARS-CoV-2 viral copies this assay can detect is 250 copies / mL. A negative result does not preclude SARS-CoV-2 infection and should not be used as the sole basis for treatment or other patient management decisions.  A negative result may occur with improper specimen collection / handling, submission of specimen other than nasopharyngeal swab, presence of viral mutation(s) within the areas targeted by this assay, and inadequate number of viral copies (<250 copies / mL). A negative result must be combined with clinical observations, patient history, and epidemiological information.  Fact Sheet for Patients:   StrictlyIdeas.no  Fact Sheet for Healthcare Providers: BankingDealers.co.za  This test is not yet approved or  cleared by the [place] FDA and has been authorized for detection and/or diagnosis of SARS-CoV-2 by FDA under an Emergency Use Authorization (EUA).  This EUA will remain in effect (meaning this test can be used) for the duration of the COVID-19 declaration under Section 564(b)(1) of the Act, 21 U.S.C. section 360bbb-3(b)(1), unless the authorization is terminated or revoked sooner.  Performed at [facility], [address] [place], [place] [address]          Radiology Studies: DG Chest 2 View  Result Date: 10/09/2019 CLINICAL DATA:  Cough and shortness of breath for 2 days EXAM: CHEST - 2 VIEW COMPARISON:  10/07/2012 FINDINGS: Cardiac shadow is enlarged. Defibrillator is noted. The lungs are well aerated bilaterally. No focal infiltrate or sizable effusion is seen. No bony abnormality is noted. IMPRESSION: No acute abnormality Electronically Signed   By: Inez Catalina M.D.   On: 10/09/2019 19:10    CT Renal Stone Study  Result Date: 10/10/2019 CLINICAL DATA:  Flank pain and fevers EXAM: CT ABDOMEN AND PELVIS WITHOUT CONTRAST TECHNIQUE: Multidetector CT imaging of the abdomen and pelvis was performed following the standard protocol without IV contrast. COMPARISON:  01/05/2011 FINDINGS: Lower chest: Small effusions are noted bilaterally right greater than left. No focal infiltrate is seen. Hepatobiliary: Gallbladder is well distended. Multiple dependent gallstones are seen. No pericholecystic fluid is noted. The liver demonstrates a normal appearance. Perihepatic fluid is noted consistent with ascites.  Pancreas: Unremarkable. No pancreatic ductal dilatation or surrounding inflammatory changes. Spleen: Normal in size without focal abnormality. Adrenals/Urinary Tract: Adrenal glands are within normal limits. Kidneys demonstrate a normal appearance without obstructive change bilaterally. Lower pole renal stone is noted on the left measuring 8-9 mm. This is slightly smaller than that seen on the prior exam. Right renal cyst is again noted and stable. Mild fullness of the collecting systems is seen without obstructing stone. Bladder has been surgically removed. A ileal loop is noted in the right mid abdomen. Stomach/Bowel: Diverticular change of the colon is noted without evidence of diverticulitis. No obstructive or inflammatory changes of the colon are seen. The appendix is within normal limits. Small bowel and stomach are unremarkable with the exception of postsurgical changes related to the ileal loop. Vascular/Lymphatic: Aortic atherosclerosis. No enlarged abdominal or pelvic lymph nodes. Reproductive: Prostate is not visualized consistent with a prior surgical history. Other: Free fluid is noted throughout the abdomen and pelvis. Some herniation of fluid through the ostomy loop in the right mid abdomen is noted. Musculoskeletal: No acute or significant osseous findings. IMPRESSION: New ascites.  Cholelithiasis without complicating factors. Nonobstructing left renal stone. Ileal loop is seen without definitive obstructive change. Diverticulosis without diverticulitis. Small effusions. Electronically Signed   By: Inez Catalina M.D.   On: 10/10/2019 00:08        Scheduled Meds: . apixaban  5 mg Oral BID  . atorvastatin  10 mg Oral Daily  . carvedilol  25 mg Oral BID WC  . furosemide  20 mg Oral Daily  . insulin aspart  0-9 Units Subcutaneous TID WC  . sodium chloride flush  3 mL Intravenous Once  . sodium chloride flush  3 mL Intravenous Q12H   Continuous Infusions: . sodium chloride Stopped (10/11/19 0018)  . meropenem (MERREM) IV 1 g (10/10/19 2233)     LOS: 1 day    Time spent: 33 mins    Wyvonnia Dusky, MD [facility] Pager [phone]  If 7PM-7AM, please contact night-coverage [email or web address] 10/11/2019, 7:51 AM

## 2019-10-12 LAB — BASIC METABOLIC PANEL
Anion gap: 10 (ref 5–15)
BUN: 60 mg/dL — ABNORMAL HIGH (ref 8–23)
CO2: 16 mmol/L — ABNORMAL LOW (ref 22–32)
Calcium: 8 mg/dL — ABNORMAL LOW (ref 8.9–10.3)
Chloride: 111 mmol/L (ref 98–111)
Creatinine, Ser: 2.64 mg/dL — ABNORMAL HIGH (ref 0.61–1.24)
GFR calc Af Amer: 28 mL/min — ABNORMAL LOW (ref >60)
GFR calc non Af Amer: 24 mL/min — ABNORMAL LOW (ref >60)
Glucose, Bld: 69 mg/dL — ABNORMAL LOW (ref 70–99)
Potassium: 3.1 mmol/L — ABNORMAL LOW (ref 3.5–5.1)
Sodium: 137 mmol/L (ref 135–145)

## 2019-10-12 LAB — CBC WITH DIFFERENTIAL/PLATELET
Abs Immature Granulocytes: 0.02 10*3/uL (ref 0.00–0.07)
Basophils Absolute: 0 10*3/uL (ref 0.0–0.1)
Basophils Relative: 0 %
Eosinophils Absolute: 0 10*3/uL (ref 0.0–0.5)
Eosinophils Relative: 0 %
HCT: 34.4 % — ABNORMAL LOW (ref 39.0–52.0)
Hemoglobin: 10.8 g/dL — ABNORMAL LOW (ref 13.0–17.0)
Immature Granulocytes: 0 %
Lymphocytes Relative: 4 %
Lymphs Abs: 0.2 10*3/uL — ABNORMAL LOW (ref 0.7–4.0)
MCH: 27.3 pg (ref 26.0–34.0)
MCHC: 31.4 g/dL (ref 30.0–36.0)
MCV: 87.1 fL (ref 80.0–100.0)
Monocytes Absolute: 0.5 10*3/uL (ref 0.1–1.0)
Monocytes Relative: 10 %
Neutro Abs: 4.3 10*3/uL (ref 1.7–7.7)
Neutrophils Relative %: 86 %
Platelets: 113 10*3/uL — ABNORMAL LOW (ref 150–400)
RBC: 3.95 MIL/uL — ABNORMAL LOW (ref 4.22–5.81)
RDW: 20 % — ABNORMAL HIGH (ref 11.5–15.5)
WBC: 5 10*3/uL (ref 4.0–10.5)
nRBC: 0 % (ref 0.0–0.2)

## 2019-10-12 LAB — GLUCOSE, CAPILLARY
Glucose-Capillary: 64 mg/dL — ABNORMAL LOW (ref 70–99)
Glucose-Capillary: 64 mg/dL — ABNORMAL LOW (ref 70–99)
Glucose-Capillary: 108 mg/dL — ABNORMAL HIGH (ref 70–99)
Glucose-Capillary: 64 mg/dL — ABNORMAL LOW (ref 70–99)
Glucose-Capillary: 64 mg/dL — ABNORMAL LOW (ref 70–99)

## 2019-10-12 LAB — CULTURE, BLOOD (ROUTINE X 2): Special Requests: ADEQUATE

## 2019-10-12 MED ORDER — CEFDINIR 300 MG PO CAPS
600.0000 mg | ORAL_CAPSULE | Freq: Two times a day (BID) | ORAL | 0 refills | Status: AC
Start: 2019-10-12 — End: 2019-10-17

## 2019-10-12 MED ORDER — POTASSIUM CHLORIDE CRYS ER 20 MEQ PO TBCR
EXTENDED_RELEASE_TABLET | ORAL | Status: AC
  Administered 2019-10-12: 20 meq
  Filled 2019-10-12: qty 1

## 2019-10-12 MED ORDER — POTASSIUM CHLORIDE CRYS ER 20 MEQ PO TBCR
40.0000 meq | EXTENDED_RELEASE_TABLET | Freq: Once | ORAL | Status: AC
  Administered 2019-10-12: 40 meq via ORAL
  Filled 2019-10-12: qty 2

## 2019-10-12 NOTE — Progress Notes (Signed)
Discharge summary reviewed with verbal understanding. Answered all questions.  

## 2019-10-12 NOTE — Progress Notes (Signed)
Patient continues to refuse bed alarm and floor mats.

## 2019-10-12 NOTE — Discharge Summary (Signed)
Physician Discharge Summary  [name] DZH:[id number] DOB: 03/10/1951 DOA: 10/09/2019  PCP: Maryland Pink, MD  Admit date: 10/09/2019 Discharge date: 10/12/2019  Admitted From: home  Disposition:  home  Recommendations for Outpatient Follow-up:  1. Follow up with PCP in 1-2 weeks   Home Health: no  Equipment/Devices:  Discharge Condition: stable CODE STATUS: full  Diet recommendation: Heart Healthy / Carb Modified   Brief/Interim Summary: HPI was taken from Dr. Myna Hidalgo: Larry Mann is a 69 y.o. male with medical history significant for bladder cancer status post cystectomy with ilial conduit in 2015, CAD, cardiomyopathy with reduced EF, type 2 diabetes mellitus, and chronic kidney disease stage IIIb, presenting to the emergency department with fever and malaise.  Patient reports that he developed fevers the night of 10/08/2019, had a mild cough recently that seems to have essentially resolved, denies any shortness of breath, denies any abdominal pain at this time but reports some mild fleeting pain earlier near the left flank.  He denies any neck stiffness, headache, wounds, or rash.  He has had some nausea with a couple episodes of nonbloody vomiting and 1 loose stool.   ED Course: Upon arrival to the ED, patient is found to be afebrile, tachycardic to 108, saturating well on room air, and with stable blood pressure.  EKG features sinus rhythm with LVH and repolarization abnormality.  Chest x-rays negative for acute cardiopulmonary disease.  CT stone study is negative for obstruction or other acute process but notable for new ascites.  Troponin was elevated to 73.  Lactic acid reassuringly normal.  CBC with leukocytosis to 15,400 and slight thrombocytopenia.  Chemistry panel features a creatinine of 2.24, up from 1.80 several days earlier and 1.5 in June 2021.  Blood and urine cultures were collected in the ED, a liter of IV fluid was given as a bolus, and the patient was treated  with Zosyn.  He refused Covid testing and was placed on isolation.  Hospital Course from Dr. Lenise Herald 8/2-10/12/19: Pt was found to have bacteremia of unknown source. Pt was initially started on IV zosyn, vanco & flagyl, then the pt was changed to IV meropenem and at d/c pt was changed to po cefdinir. Repeat blood cxs were NGTD x 2days. Also, pt was found to have AKI on CKDIIIb and pt's Cr on d/c 2.64 and trending down from day prior. Furthermore, PT/OT saw pt and did not recommend any f/u therapy as pt was ambulating and transferring independently. For more information please see previous progress notes.    Discharge Diagnoses:  Principal Problem:   Severe sepsis ([place]) Active Problems:   Bladder cancer ([place])   Diabetes mellitus type 2, uncomplicated ([place])   HTN (hypertension)   Acute renal failure superimposed on stage 3b chronic kidney disease (HCC)   Coronary artery disease   Cardiomyopathy ([place])   Elevated troponin   E. coli bacteremia: transition to po cefdinir.  Repeat blood cx NGTD. Unknown source   CAD: w/ elevated troponins. Likely secondary to demand ischemia.  No chest pain or acute change on EKG. Continue on home dose of statin, carvedilol  AKI on CKDIIIb: baseline Cr around 1.6-1.8. Cr is trending up today. Avoid nephrotoxic meds. Will hold lasix tomorrow if Cr continues to trend up  Hypokalemia: KCl repleted again. Will continue to monitor   Chronic systolic CHF: continue on home dose of lasix. Monitor I/Os.   DM2: continue on SSI w/ accuchecks   Chronic anticoagulation: etiology unclear, put on  this by the pt's cardiologist for unknown reason. Continue on eliquis. Will need to further discuss w/ his cardiologist  Thrombocytopenia: etiology unclear. Will continue to monitor   Discharge Instructions  Discharge Instructions    Diet - low sodium heart healthy   Complete by: As directed    Diet Carb Modified   Complete by: As directed    Discharge instructions    Complete by: As directed    F/u PCP in 1 week.   Increase activity slowly   Complete by: As directed      Allergies as of 10/12/2019   No Known Allergies     Medication List    STOP taking these medications   nitrofurantoin (macrocrystal-monohydrate) 100 MG capsule Commonly known as: MACROBID     TAKE these medications   atorvastatin 20 MG tablet Commonly known as: LIPITOR Take 10 mg by mouth daily.   carvedilol 12.5 MG tablet Commonly known as: COREG Take 25 mg by mouth 2 (two) times daily with a meal.   cefdinir 300 MG capsule Commonly known as: OMNICEF Take 2 capsules (600 mg total) by mouth 2 (two) times daily for 5 days.   dapagliflozin propanediol 10 MG Tabs tablet Commonly known as: FARXIGA Take 10 mg by mouth daily. Notes to patient: Not given in hospital   Eliquis 5 MG Tabs tablet Generic drug: apixaban Take 5 mg by mouth 2 (two) times daily.   Entresto 49-51 MG Generic drug: sacubitril-valsartan Take 1 tablet by mouth 2 (two) times daily. Notes to patient: Not given in hospital   fluticasone 50 MCG/ACT nasal spray Commonly known as: FLONASE SHAKE LIQUID AND USE 1 SPRAY IN EACH NOSTRIL TWICE DAILY Notes to patient: Not given in hospital   furosemide 40 MG tablet Commonly known as: LASIX Take 20 mg by mouth daily.   glipiZIDE 5 MG 24 hr tablet Commonly known as: GLUCOTROL XL Take 5 mg by mouth 2 (two) times daily. Notes to patient: Not given in hospital   metFORMIN 1000 MG tablet Commonly known as: GLUCOPHAGE Take 1,000 mg by mouth 2 (two) times daily with a meal. Notes to patient: Not given in hospital   vitamin B-12 1000 MCG tablet Commonly known as: CYANOCOBALAMIN Take 1,000 mcg by mouth daily. Notes to patient: Not given in hospital       No Known Allergies  Consultations:     Procedures/Studies: DG Chest 2 View  Result Date: 10/09/2019 CLINICAL DATA:  Cough and shortness of breath for 2 days EXAM: CHEST - 2 VIEW COMPARISON:   10/07/2012 FINDINGS: Cardiac shadow is enlarged. Defibrillator is noted. The lungs are well aerated bilaterally. No focal infiltrate or sizable effusion is seen. No bony abnormality is noted. IMPRESSION: No acute abnormality Electronically Signed   By: Inez Catalina M.D.   On: 10/09/2019 19:10   CT Renal Stone Study  Result Date: 10/10/2019 CLINICAL DATA:  Flank pain and fevers EXAM: CT ABDOMEN AND PELVIS WITHOUT CONTRAST TECHNIQUE: Multidetector CT imaging of the abdomen and pelvis was performed following the standard protocol without IV contrast. COMPARISON:  01/05/2011 FINDINGS: Lower chest: Small effusions are noted bilaterally right greater than left. No focal infiltrate is seen. Hepatobiliary: Gallbladder is well distended. Multiple dependent gallstones are seen. No pericholecystic fluid is noted. The liver demonstrates a normal appearance. Perihepatic fluid is noted consistent with ascites. Pancreas: Unremarkable. No pancreatic ductal dilatation or surrounding inflammatory changes. Spleen: Normal in size without focal abnormality. Adrenals/Urinary Tract: Adrenal glands are within normal limits. Kidneys  demonstrate a normal appearance without obstructive change bilaterally. Lower pole renal stone is noted on the left measuring 8-9 mm. This is slightly smaller than that seen on the prior exam. Right renal cyst is again noted and stable. Mild fullness of the collecting systems is seen without obstructing stone. Bladder has been surgically removed. A ileal loop is noted in the right mid abdomen. Stomach/Bowel: Diverticular change of the colon is noted without evidence of diverticulitis. No obstructive or inflammatory changes of the colon are seen. The appendix is within normal limits. Small bowel and stomach are unremarkable with the exception of postsurgical changes related to the ileal loop. Vascular/Lymphatic: Aortic atherosclerosis. No enlarged abdominal or pelvic lymph nodes. Reproductive: Prostate is not  visualized consistent with a prior surgical history. Other: Free fluid is noted throughout the abdomen and pelvis. Some herniation of fluid through the ostomy loop in the right mid abdomen is noted. Musculoskeletal: No acute or significant osseous findings. IMPRESSION: New ascites. Cholelithiasis without complicating factors. Nonobstructing left renal stone. Ileal loop is seen without definitive obstructive change. Diverticulosis without diverticulitis. Small effusions. Electronically Signed   By: Inez Catalina M.D.   On: 10/10/2019 00:08    (Echo, Carotid, EGD, Colonoscopy, ERCP)    Subjective: Pt c/o malaise    Discharge Exam: Vitals:   10/11/19 2315 10/12/19 0805  BP: 105/72 122/83  Pulse: 76 80  Resp: 19 14  Temp: 98.2 F (36.8 C) 98.7 F (37.1 C)  SpO2: 96% 98%   Vitals:   10/11/19 1950 10/11/19 2315 10/12/19 0427 10/12/19 0805  BP: 110/75 105/72  122/83  Pulse: 90 76  80  Resp:  19  14  Temp: 99.2 F (37.3 C) 98.2 F (36.8 C)  98.7 F (37.1 C)  TempSrc: Oral Oral  Oral  SpO2: 98% 96%  98%  Weight:   81.1 kg   Height:        General: Pt is alert, awake, not in acute distress Cardiovascular:  S1/S2 +, no rubs, no gallops Respiratory: CTA bilaterally, no wheezing, no rhonchi Abdominal: Soft, NT, ND, bowel sounds + Extremities: no edema, no cyanosis    The results of significant diagnostics from this hospitalization (including imaging, microbiology, ancillary and laboratory) are listed below for reference.     Microbiology: Recent Results (from the past 240 hour(s))  Blood Culture (routine x 2)     Status: Abnormal   Collection Time: 10/09/19  6:26 PM   Specimen: BLOOD  Result Value Ref Range Status   Specimen Description   Final    BLOOD LEFT [facility] Performed at [facility], [address], [place], [place] [address]    Special Requests   Final    BOTTLES DRAWN AEROBIC AND ANAEROBIC Blood Culture adequate volume Performed at [facility],  [address] [place], [place] [address]    Culture  Setup Time   Final    Organism ID to follow GRAM NEGATIVE RODS IN BOTH AEROBIC AND ANAEROBIC BOTTLES CRITICAL RESULT CALLED TO, READ BACK BY AND VERIFIED WITH:  Winfield Rast PATEL ON 10/10/2019 AT 1037 TIK Performed at [facility], [address] [place], [place] [address]    Culture ESCHERICHIA COLI (A)  Final   Report Status 10/12/2019 FINAL  Final   Organism ID, Bacteria ESCHERICHIA COLI  Final      Susceptibility   Escherichia coli - MIC*    AMPICILLIN >=32 RESISTANT Resistant     CEFAZOLIN <=4 SENSITIVE Sensitive  CEFEPIME <=0.12 SENSITIVE Sensitive     CEFTAZIDIME <=1 SENSITIVE Sensitive     CEFTRIAXONE <=0.25 SENSITIVE Sensitive     CIPROFLOXACIN >=4 RESISTANT Resistant     GENTAMICIN <=1 SENSITIVE Sensitive     IMIPENEM <=0.25 SENSITIVE Sensitive     TRIMETH/SULFA >=320 RESISTANT Resistant     AMPICILLIN/SULBACTAM >=32 RESISTANT Resistant     PIP/TAZO <=4 SENSITIVE Sensitive     * ESCHERICHIA COLI  Blood Culture ID Panel (Reflexed)     Status: Abnormal   Collection Time: 10/09/19  6:26 PM  Result Value Ref Range Status   Enterococcus species NOT DETECTED NOT DETECTED Final   Listeria monocytogenes NOT DETECTED NOT DETECTED Final   Staphylococcus species NOT DETECTED NOT DETECTED Final   Staphylococcus aureus (BCID) NOT DETECTED NOT DETECTED Final   Streptococcus species NOT DETECTED NOT DETECTED Final   Streptococcus agalactiae NOT DETECTED NOT DETECTED Final   Streptococcus pneumoniae NOT DETECTED NOT DETECTED Final   Streptococcus pyogenes NOT DETECTED NOT DETECTED Final   Acinetobacter baumannii NOT DETECTED NOT DETECTED Final   Enterobacteriaceae species DETECTED (A) NOT DETECTED Final    Comment: Enterobacteriaceae represent a large family of gram-negative bacteria, not a single organism. CRITICAL RESULT CALLED TO, READ BACK BY AND VERIFIED WITH: KISHAN PATEL ON 10/10/19 AT 1037 TIK     Enterobacter cloacae complex NOT DETECTED NOT DETECTED Final   Escherichia coli DETECTED (A) NOT DETECTED Final    Comment: CRITICAL RESULT CALLED TO, READ BACK BY AND VERIFIED WITH: KISHAN PATEL ON 10/10/19 AT 1037 TIK    Klebsiella oxytoca NOT DETECTED NOT DETECTED Final   Klebsiella pneumoniae NOT DETECTED NOT DETECTED Final   Proteus species NOT DETECTED NOT DETECTED Final   Serratia marcescens NOT DETECTED NOT DETECTED Final   Carbapenem resistance NOT DETECTED NOT DETECTED Final   Haemophilus influenzae NOT DETECTED NOT DETECTED Final   Neisseria meningitidis NOT DETECTED NOT DETECTED Final   Pseudomonas aeruginosa NOT DETECTED NOT DETECTED Final   Candida albicans NOT DETECTED NOT DETECTED Final   Candida glabrata NOT DETECTED NOT DETECTED Final   Candida krusei NOT DETECTED NOT DETECTED Final   Candida parapsilosis NOT DETECTED NOT DETECTED Final   Candida tropicalis NOT DETECTED NOT DETECTED Final    Comment: Performed at [facility], [address] [place], [place] [address]  Urine culture     Status: Abnormal   Collection Time: 10/10/19 12:30 AM   Specimen: In/Out Cath Urine  Result Value Ref Range Status   Specimen Description   Final    IN/OUT CATH URINE Performed at [facility], [address] [place], [place] [address]    Special Requests   Final    NONE Performed at [facility], [address] [place], [place] [address]    Culture MULTIPLE SPECIES PRESENT, SUGGEST RECOLLECTION (A)  Final   Report Status 10/11/2019 FINAL  Final  Blood Culture (routine x 2)     Status: None (Preliminary result)   Collection Time: 10/10/19 12:31 AM   Specimen: BLOOD  Result Value Ref Range Status   Specimen Description BLOOD LEFT [facility]  Final   Special Requests   Final    BOTTLES DRAWN AEROBIC AND ANAEROBIC Blood Culture adequate volume   Culture   Final    NO GROWTH 2 DAYS Performed at [facility], [address], [place],   [address]    Report Status PENDING  Incomplete  SARS Coronavirus 2 by RT PCR (hospital order,  performed in [facility] hospital lab) Nasopharyngeal Nasopharyngeal Swab     Status: None   Collection Time: 10/10/19 11:29 AM   Specimen: Nasopharyngeal Swab  Result Value Ref Range Status   SARS Coronavirus 2 NEGATIVE NEGATIVE Final    Comment: (NOTE) SARS-CoV-2 target nucleic acids are NOT DETECTED.  The SARS-CoV-2 RNA is generally detectable in upper and lower respiratory specimens during the acute phase of infection. The lowest concentration of SARS-CoV-2 viral copies this assay can detect is 250 copies / mL. A negative result does not preclude SARS-CoV-2 infection and should not be used as the sole basis for treatment or other patient management decisions.  A negative result may occur with improper specimen collection / handling, submission of specimen other than nasopharyngeal swab, presence of viral mutation(s) within the areas targeted by this assay, and inadequate number of viral copies (<250 copies / mL). A negative result must be combined with clinical observations, patient history, and epidemiological information.  Fact Sheet for Patients:   StrictlyIdeas.no  Fact Sheet for Healthcare Providers: BankingDealers.co.za  This test is not yet approved or  cleared by the [place] FDA and has been authorized for detection and/or diagnosis of SARS-CoV-2 by FDA under an Emergency Use Authorization (EUA).  This EUA will remain in effect (meaning this test can be used) for the duration of the COVID-19 declaration under Section 564(b)(1) of the Act, 21 U.S.C. section 360bbb-3(b)(1), unless the authorization is terminated or revoked sooner.  Performed at [facility], [address] [place], [place] [address]      Labs: BNP (last 3 results) No results for input(s): BNP in the last 8760 hours. Basic Metabolic  Panel: Recent Labs  Lab 10/09/19 1826 10/10/19 0630 10/11/19 0523 10/12/19 0431  NA 141 138 141 137  K 3.5 3.2* 3.3* 3.1*  CL 113* 111 113* 111  CO2 18* 16* 19* 16*  GLUCOSE 134* 90 60* 69*  BUN 40* 44* 52* 60*  CREATININE 2.24* 2.42* 2.89* 2.64*  CALCIUM 8.4* 8.3* 8.0* 8.0*  MG  --  1.6*  --   --    Liver Function Tests: Recent Labs  Lab 10/09/19 1826  AST 19  ALT 21  ALKPHOS 106  BILITOT 2.3*  PROT 6.5  ALBUMIN 3.8   No results for input(s): LIPASE, AMYLASE in the last 168 hours. No results for input(s): AMMONIA in the last 168 hours. CBC: Recent Labs  Lab 10/09/19 1826 10/10/19 0630 10/11/19 0523 10/12/19 0431  WBC 15.4* 11.7* 7.2 5.0  NEUTROABS 14.4* 10.8* 6.4 4.3  HGB 12.7* 11.6* 11.1* 10.8*  HCT 41.2 37.2* 35.6* 34.4*  MCV 87.5 87.5 87.0 87.1  PLT 143* 122* 103* 113*   Cardiac Enzymes: No results for input(s): CKTOTAL, CKMB, CKMBINDEX, TROPONINI in the last 168 hours. BNP: Invalid input(s): POCBNP CBG: Recent Labs  Lab 10/11/19 1634 10/11/19 2010 10/12/19 0801 10/12/19 0834 10/12/19 0914  GLUCAP 91 86 64*   64* 64*   64* 108*   D-Dimer No results for input(s): DDIMER in the last 72 hours. Hgb A1c Recent Labs    10/10/19 0630  HGBA1C 5.7*   Lipid Profile No results for input(s): CHOL, HDL, LDLCALC, TRIG, CHOLHDL, LDLDIRECT in the last 72 hours. Thyroid function studies No results for input(s): TSH, T4TOTAL, T3FREE, THYROIDAB in the last 72 hours.  Invalid input(s): FREET3 Anemia work up No results for input(s): VITAMINB12, FOLATE, FERRITIN, TIBC, IRON, RETICCTPCT in the last 72 hours. Urinalysis    Component Value Date/Time  COLORURINE YELLOW (A) 10/10/2019 0031   APPEARANCEUR TURBID (A) 10/10/2019 0031   LABSPEC 1.010 10/10/2019 0031   PHURINE 8.0 10/10/2019 0031   GLUCOSEU NEGATIVE 10/10/2019 0031   HGBUR MODERATE (A) 10/10/2019 0031   BILIRUBINUR NEGATIVE 10/10/2019 0031   KETONESUR NEGATIVE 10/10/2019 0031   PROTEINUR 100  (A) 10/10/2019 0031   NITRITE NEGATIVE 10/10/2019 0031   LEUKOCYTESUR SMALL (A) 10/10/2019 0031   Sepsis Labs Invalid input(s): PROCALCITONIN,  WBC,  LACTICIDVEN Microbiology Recent Results (from the past 240 hour(s))  Blood Culture (routine x 2)     Status: Abnormal   Collection Time: 10/09/19  6:26 PM   Specimen: BLOOD  Result Value Ref Range Status   Specimen Description   Final    BLOOD LEFT [facility] Performed at [facility], [address] [place], [place] [address]    Special Requests   Final    BOTTLES DRAWN AEROBIC AND ANAEROBIC Blood Culture adequate volume Performed at [facility], [address] [place],  [address]    Culture  Setup Time   Final    Organism ID to follow GRAM NEGATIVE RODS IN BOTH AEROBIC AND ANAEROBIC BOTTLES CRITICAL RESULT CALLED TO, READ BACK BY AND VERIFIED WITH:  Winfield Rast PATEL ON 10/10/2019 AT 1037 TIK Performed at [facility], [address] [place],  [address]    Culture ESCHERICHIA COLI (A)  Final   Report Status 10/12/2019 FINAL  Final   Organism ID, Bacteria ESCHERICHIA COLI  Final      Susceptibility   Escherichia coli - MIC*    AMPICILLIN >=32 RESISTANT Resistant     CEFAZOLIN <=4 SENSITIVE Sensitive     CEFEPIME <=0.12 SENSITIVE Sensitive     CEFTAZIDIME <=1 SENSITIVE Sensitive     CEFTRIAXONE <=0.25 SENSITIVE Sensitive     CIPROFLOXACIN >=4 RESISTANT Resistant     GENTAMICIN <=1 SENSITIVE Sensitive     IMIPENEM <=0.25 SENSITIVE Sensitive     TRIMETH/SULFA >=320 RESISTANT Resistant     AMPICILLIN/SULBACTAM >=32 RESISTANT Resistant     PIP/TAZO <=4 SENSITIVE Sensitive     * ESCHERICHIA COLI  Blood Culture ID Panel (Reflexed)     Status: Abnormal   Collection Time: 10/09/19  6:26 PM  Result Value Ref Range Status   Enterococcus species NOT DETECTED NOT DETECTED Final   Listeria monocytogenes NOT DETECTED NOT DETECTED Final   Staphylococcus species NOT DETECTED NOT DETECTED Final    Staphylococcus aureus (BCID) NOT DETECTED NOT DETECTED Final   Streptococcus species NOT DETECTED NOT DETECTED Final   Streptococcus agalactiae NOT DETECTED NOT DETECTED Final   Streptococcus pneumoniae NOT DETECTED NOT DETECTED Final   Streptococcus pyogenes NOT DETECTED NOT DETECTED Final   Acinetobacter baumannii NOT DETECTED NOT DETECTED Final   Enterobacteriaceae species DETECTED (A) NOT DETECTED Final    Comment: Enterobacteriaceae represent a large family of gram-negative bacteria, not a single organism. CRITICAL RESULT CALLED TO, READ BACK BY AND VERIFIED WITH: KISHAN PATEL ON 10/10/19 AT 1037 TIK    Enterobacter cloacae complex NOT DETECTED NOT DETECTED Final   Escherichia coli DETECTED (A) NOT DETECTED Final    Comment: CRITICAL RESULT CALLED TO, READ BACK BY AND VERIFIED WITH: KISHAN PATEL ON 10/10/19 AT 1037 TIK    Klebsiella oxytoca NOT DETECTED NOT DETECTED Final   Klebsiella pneumoniae NOT DETECTED NOT DETECTED Final   Proteus species NOT DETECTED NOT DETECTED Final   Serratia marcescens NOT DETECTED NOT DETECTED Final   Carbapenem resistance NOT DETECTED NOT  DETECTED Final   Haemophilus influenzae NOT DETECTED NOT DETECTED Final   Neisseria meningitidis NOT DETECTED NOT DETECTED Final   Pseudomonas aeruginosa NOT DETECTED NOT DETECTED Final   Candida albicans NOT DETECTED NOT DETECTED Final   Candida glabrata NOT DETECTED NOT DETECTED Final   Candida krusei NOT DETECTED NOT DETECTED Final   Candida parapsilosis NOT DETECTED NOT DETECTED Final   Candida tropicalis NOT DETECTED NOT DETECTED Final    Comment: Performed at [facility], [address] [place], [place] [address]  Urine culture     Status: Abnormal   Collection Time: 10/10/19 12:30 AM   Specimen: In/Out Cath Urine  Result Value Ref Range Status   Specimen Description   Final    IN/OUT CATH URINE Performed at [facility], [address] [place], [place] [address]    Special  Requests   Final    NONE Performed at [facility], [address] [place], [place] [address]    Culture MULTIPLE SPECIES PRESENT, SUGGEST RECOLLECTION (A)  Final   Report Status 10/11/2019 FINAL  Final  Blood Culture (routine x 2)     Status: None (Preliminary result)   Collection Time: 10/10/19 12:31 AM   Specimen: BLOOD  Result Value Ref Range Status   Specimen Description BLOOD LEFT AC  Final   Special Requests   Final    BOTTLES DRAWN AEROBIC AND ANAEROBIC Blood Culture adequate volume   Culture   Final    NO GROWTH 2 DAYS Performed at [facility], [address], [place], [place] [address]    Report Status PENDING  Incomplete  SARS Coronavirus 2 by RT PCR (hospital order, performed in [facility] hospital lab) Nasopharyngeal Nasopharyngeal Swab     Status: None   Collection Time: 10/10/19 11:29 AM   Specimen: Nasopharyngeal Swab  Result Value Ref Range Status   SARS Coronavirus 2 NEGATIVE NEGATIVE Final    Comment: (NOTE) SARS-CoV-2 target nucleic acids are NOT DETECTED.  The SARS-CoV-2 RNA is generally detectable in upper and lower respiratory specimens during the acute phase of infection. The lowest concentration of SARS-CoV-2 viral copies this assay can detect is 250 copies / mL. A negative result does not preclude SARS-CoV-2 infection and should not be used as the sole basis for treatment or other patient management decisions.  A negative result may occur with improper specimen collection / handling, submission of specimen other than nasopharyngeal swab, presence of viral mutation(s) within the areas targeted by this assay, and inadequate number of viral copies (<250 copies / mL). A negative result must be combined with clinical observations, patient history, and epidemiological information.  Fact Sheet for Patients:   StrictlyIdeas.no  Fact Sheet for Healthcare  Providers: BankingDealers.co.za  This test is not yet approved or  cleared by the [place] FDA and has been authorized for detection and/or diagnosis of SARS-CoV-2 by FDA under an Emergency Use Authorization (EUA).  This EUA will remain in effect (meaning this test can be used) for the duration of the COVID-19 declaration under Section 564(b)(1) of the Act, 21 U.S.C. section 360bbb-3(b)(1), unless the authorization is terminated or revoked sooner.  Performed at [facility], [address] [place], [place] [address]      Time coordinating discharge: Over 30 minutes  SIGNED:   Wyvonnia Dusky, MD  [facility] 10/12/2019, 1:22 PM Pager   If 7PM-7AM, please contact night-coverage [email or web address]

## 2019-10-14 ENCOUNTER — Inpatient Hospital Stay: Payer: Medicare Other | Admitting: Oncology

## 2019-10-15 ENCOUNTER — Encounter: Payer: Self-pay | Admitting: Oncology

## 2019-10-15 ENCOUNTER — Other Ambulatory Visit: Payer: Self-pay

## 2019-10-15 ENCOUNTER — Inpatient Hospital Stay: Payer: Medicare Other | Attending: Oncology | Admitting: Oncology

## 2019-10-15 VITALS — BP 134/81 | HR 84 | Temp 95.2°F | Resp 18 | Wt 178.5 lb

## 2019-10-15 DIAGNOSIS — I130 Hypertensive heart and chronic kidney disease with heart failure and stage 1 through stage 4 chronic kidney disease, or unspecified chronic kidney disease: Secondary | ICD-10-CM | POA: Diagnosis not present

## 2019-10-15 DIAGNOSIS — Z85828 Personal history of other malignant neoplasm of skin: Secondary | ICD-10-CM | POA: Diagnosis not present

## 2019-10-15 DIAGNOSIS — D649 Anemia, unspecified: Secondary | ICD-10-CM | POA: Diagnosis not present

## 2019-10-15 DIAGNOSIS — I252 Old myocardial infarction: Secondary | ICD-10-CM | POA: Diagnosis not present

## 2019-10-15 DIAGNOSIS — D72810 Lymphocytopenia: Secondary | ICD-10-CM | POA: Diagnosis not present

## 2019-10-15 DIAGNOSIS — Z87891 Personal history of nicotine dependence: Secondary | ICD-10-CM | POA: Insufficient documentation

## 2019-10-15 DIAGNOSIS — N1832 Chronic kidney disease, stage 3b: Secondary | ICD-10-CM | POA: Diagnosis not present

## 2019-10-15 DIAGNOSIS — Z8546 Personal history of malignant neoplasm of prostate: Secondary | ICD-10-CM | POA: Insufficient documentation

## 2019-10-15 DIAGNOSIS — Z79899 Other long term (current) drug therapy: Secondary | ICD-10-CM | POA: Diagnosis not present

## 2019-10-15 DIAGNOSIS — Z8551 Personal history of malignant neoplasm of bladder: Secondary | ICD-10-CM | POA: Diagnosis not present

## 2019-10-15 DIAGNOSIS — I2510 Atherosclerotic heart disease of native coronary artery without angina pectoris: Secondary | ICD-10-CM | POA: Insufficient documentation

## 2019-10-15 DIAGNOSIS — E1122 Type 2 diabetes mellitus with diabetic chronic kidney disease: Secondary | ICD-10-CM | POA: Insufficient documentation

## 2019-10-15 LAB — CULTURE, BLOOD (ROUTINE X 2)
Culture: NO GROWTH
Special Requests: ADEQUATE

## 2019-10-15 NOTE — Progress Notes (Signed)
Hematology/Oncology Consult note [facility] Telephone:([phone] [phone]   Patient Care Team: Maryland Pink, MD as PCP - General (Family Medicine)  REFERRING PROVIDER: Maryland Pink, MD  CHIEF COMPLAINTS/REASON FOR VISIT:  Follow-up foranemia  HISTORY OF PRESENTING ILLNESS:   Larry Mann is a  69 y.o.  male with PMH listed below was seen in consultation at the request of  Maryland Pink, MD  for evaluation of anemia I reviewed patient's previous medical records via care everywhere. 09/20/2019 CBC showed white count of 2.9, hemoglobin 12.3, platelet 156,000, decreased lymphocyte, increased neutrophil percentage, reviewed his previous lab results. Anemia appears to be chronic onset, duration since at least 2018. Patient has history of chronic kidney disease, CHF on Lasix, bilateral lower extremity swelling, history of MI, diabetes, history of basal cell carcinoma.  He takes nitrofurantoin chronically for suppression of recurrent UTI.  On chronic anticoagulation with Eliquis 5 mg twice daily.  Denies any bleeding events.  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  Accession #:  [id number]  Diagnosis:  FSA: Ureter margin, right distal, biopsy  - Perpendicular cross sections of benign urothelium and ureter wall  - No urothelial dysplasia, papillary urothelial neoplasm or invasive carcinoma  identified  FSB: Ureter, left distal margin, biopsy  - Longitudinalsections of benign urothelium and ureter wall  - No urothelial dysplasia, papillary urothelial neoplasm or invasive carcinoma  identified  C: Lymph node, left external iliac, regional node dissection  - No metastatic carcinoma identified (0/1)   D: Lymph nodes, right external iliac, regional node dissection  - No metastatic carcinoma identified (0/4)   E: Lymph nodes, left obturator, regional node dissection  - No metastatic  carcinoma identified (0/4 lymph node fragments)    F: Lymph nodes, right obturator, regional node dissection  - No metastatic carcinoma identified (0/5 lymph node fragments)  G: Bladder and prostate, cystoprostatectomy   Tumor histologic type:   - Papillary urothelial carcinoma, high grade, and urothelial carcinoma in situ,  non-invasive.    Tumor histologic grade [facility]): high  Microscopic tumor size: grossly 2.5 cm diameter  Microscopic extent of invasion: non-invasive  Tumor site/focality of tumor:Right lateral wall at right UVJ grossly, but  multifocal microscopically  Angiolymphatic space invasion: not identified  Associated epithelial lesions: see above  Ureters: UCIS left UVJ  Histologic assessment of surgical margins:    Ureter, left:negative     Ureter, right: negative     Urethra: negative    Perivesicular soft tissue: negative   Other significant findings: necrotizing granulomatous inflammation, consistent  with prior BCG treatment  Lymph nodes:Separately submitted, see specimens C-F, above, all negative,  total 0/14  Diagnoses of other organs or structures: Prostate and seminal vesicles: no  carcinoma identified  AJCC Stage (bladder): pTa/pTis  pN0  pMx (current case only)  Patient follows up with [facility] urology and was last seen on 09/02/2019. 12/21/2013, he also has a history of kidney stone extraction-100% calcium oxalate monohydrate stones  INTERVAL HISTORY [name] is a 69 y.o. male who has above history reviewed by me today presents for follow up visit for anemia Problems and complaints are listed below: Patient presents to discuss blood work results. During interval, patient was admitted due to E. coli sepsis.  Patient was treated with antibiotics.  He is currently on Omnicef. After he finished that, he will resume nitrofurantoin for antibiotic prophylaxis.  He has no new complaints  today.  No  fever, chills,  Review of Systems  Constitutional: Positive for fatigue. Negative for appetite change, chills, fever and unexpected weight change.  HENT:   Negative for hearing loss and voice change.   Eyes: Negative for eye problems and icterus.  Respiratory: Negative for chest tightness, cough and shortness of breath.   Cardiovascular: Negative for chest pain and leg swelling.  Gastrointestinal: Negative for abdominal distention and abdominal pain.  Endocrine: Negative for hot flashes.  Genitourinary: Negative for difficulty urinating, dysuria and frequency.   Musculoskeletal: Negative for arthralgias.  Skin: Negative for itching and rash.  Neurological: Negative for light-headedness and numbness.  Hematological: Negative for adenopathy. Does not bruise/bleed easily.  Psychiatric/Behavioral: Negative for confusion.    MEDICAL HISTORY:  Past Medical History:  Diagnosis Date  . Anginal pain ([place])   . Basal cell carcinoma 06/20/2009   R nose supratip  . Bladder cancer ([place])   . Cancer ([place])   . Chicken pox   . Chronic kidney disease   . Coronary artery disease   . Diabetes mellitus without complication ([place])   . Headache   . Hypertension   . Myocardial infarction ([place])   . Plantar fasciitis   . Prostate cancer [facility])     SURGICAL HISTORY: Past Surgical History:  Procedure Laterality Date  . CARDIOVERSION N/A 04/23/2017   Procedure: CARDIOVERSION;  Surgeon: Corey Skains, MD;  Location: [facility] ORS;  Service: Cardiovascular;  Laterality: N/A;  . CARDIOVERSION N/A 03/27/2018   Procedure: CARDIOVERSION (CATH LAB);  Surgeon: Corey Skains, MD;  Location: [facility] ORS;  Service: Cardiovascular;  Laterality: N/A;  . COLONOSCOPY    . COLONOSCOPY WITH PROPOFOL N/A 10/27/2014   Procedure: COLONOSCOPY WITH PROPOFOL;  Surgeon: Manya Silvas, MD;  Location: [facility] ENDOSCOPY;  Service: Endoscopy;  Laterality: N/A;  . CORONARY ANGIOPLASTY    . CYSTECTOMY    . HERNIA REPAIR     . LITHOTRIPSY    . MOHS SURGERY    . PROSTATE SURGERY      SOCIAL HISTORY: Social History   Socioeconomic History  . Marital status: Married    Spouse name: Not on file  . Number of children: Not on file  . Years of education: Not on file  . Highest education level: Not on file  Occupational History  . Not on file  Tobacco Use  . Smoking status: Former Smoker    Packs/day: 1.50    Years: 25.00    Pack years: 37.50    Quit date: 05/19/2001    Years since quitting: 18.4  . Smokeless tobacco: Never Used  Vaping Use  . Vaping Use: Never used  Substance and Sexual Activity  . Alcohol use: Yes    Comment: occasional  . Drug use: No  . Sexual activity: Not on file  Other Topics Concern  . Not on file  Social History Narrative  . Not on file   Social Determinants of Health   Financial Resource Strain:   . Difficulty of Paying Living Expenses:   Food Insecurity:   . Worried About Charity fundraiser in the Last Year:   . Arboriculturist in the Last Year:   Transportation Needs:   . Film/video editor (Medical):   Marland Kitchen Lack of Transportation (Non-Medical):   Physical Activity:   . Days of Exercise per Week:   . Minutes of Exercise per Session:   Stress:   . Feeling of Stress :   Social Connections:   . Frequency  of Communication with Friends and Family:   . Frequency of Social Gatherings with Friends and Family:   . Attends Religious Services:   . Active Member of Clubs or Organizations:   . Attends Archivist Meetings:   Marland Kitchen Marital Status:   Intimate Partner Violence:   . Fear of Current or Ex-Partner:   . Emotionally Abused:   Marland Kitchen Physically Abused:   . Sexually Abused:     FAMILY HISTORY: Family History  Problem Relation Age of Onset  . Multiple myeloma Mother     ALLERGIES:  has No Known Allergies.  MEDICATIONS:  Current Outpatient Medications  Medication Sig Dispense Refill  . atorvastatin (LIPITOR) 20 MG tablet Take 10 mg by mouth  daily.     . carvedilol (COREG) 12.5 MG tablet Take 25 mg by mouth 2 (two) times daily with a meal.     . cefdinir (OMNICEF) 300 MG capsule Take 2 capsules (600 mg total) by mouth 2 (two) times daily for 5 days. 20 capsule 0  . dapagliflozin propanediol (FARXIGA) 10 MG TABS tablet Take 10 mg by mouth daily.     Marland Kitchen ELIQUIS 5 MG TABS tablet Take 5 mg by mouth 2 (two) times daily.  11  . furosemide (LASIX) 40 MG tablet Take 20 mg by mouth daily.    Marland Kitchen glipiZIDE (GLUCOTROL XL) 5 MG 24 hr tablet Take 5 mg by mouth 2 (two) times daily.     . metFORMIN (GLUCOPHAGE) 1000 MG tablet Take 1,000 mg by mouth 2 (two) times daily with a meal.    . sacubitril-valsartan (ENTRESTO) 49-51 MG Take 1 tablet by mouth 2 (two) times daily.    . vitamin B-12 (CYANOCOBALAMIN) 1000 MCG tablet Take 1,000 mcg by mouth daily.     No current facility-administered medications for this visit.     PHYSICAL EXAMINATION: ECOG PERFORMANCE STATUS: 1 - Symptomatic but completely ambulatory Vitals:   10/15/19 1021  BP: 134/81  Pulse: 84  Resp: 18  Temp: (!) 95.2 F (35.1 C)  SpO2: 100%   Filed Weights   10/15/19 1021  Weight: 178 lb 8 oz (81 kg)    Physical Exam Constitutional:      General: He is not in acute distress. HENT:     Head: Normocephalic and atraumatic.  Eyes:     General: No scleral icterus. Cardiovascular:     Rate and Rhythm: Normal rate and regular rhythm.     Heart sounds: Normal heart sounds.  Pulmonary:     Effort: Pulmonary effort is normal. No respiratory distress.     Breath sounds: No wheezing.  Abdominal:     General: Bowel sounds are normal. There is no distension.     Palpations: Abdomen is soft.     Comments: + Urostomy bag  Musculoskeletal:        General: Swelling present. No deformity. Normal range of motion.     Cervical back: Normal range of motion and neck supple.     Comments: Bilateral lower extremity 2+ edema  Skin:    General: Skin is warm and dry.     Findings: No  erythema or rash.  Neurological:     Mental Status: He is alert and oriented to person, place, and time. Mental status is at baseline.     Cranial Nerves: No cranial nerve deficit.     Coordination: Coordination normal.  Psychiatric:        Mood and Affect: Mood normal.  LABORATORY DATA:  I have reviewed the data as listed Lab Results  Component Value Date   WBC 5.0 10/12/2019   HGB 10.8 (L) 10/12/2019   HCT 34.4 (L) 10/12/2019   MCV 87.1 10/12/2019   PLT 113 (L) 10/12/2019   Recent Labs    09/28/19 1203 09/28/19 1203 10/09/19 1826 10/09/19 1826 10/10/19 0630 10/11/19 0523 10/12/19 0431  NA 140   < > 141   < > 138 141 137  K 4.1   < > 3.5   < > 3.2* 3.3* 3.1*  CL 114*   < > 113*   < > 111 113* 111  CO2 19*   < > 18*   < > 16* 19* 16*  GLUCOSE 147*   < > 134*   < > 90 60* 69*  BUN 44*   < > 40*   < > 44* 52* 60*  CREATININE 1.80*   < > 2.24*   < > 2.42* 2.89* 2.64*  CALCIUM 8.9   < > 8.4*   < > 8.3* 8.0* 8.0*  GFRNONAA 38*   < > 29*   < > 27* 21* 24*  GFRAA 44*   < > 34*   < > 31* 25* 28*  PROT 6.6  --  6.5  --   --   --   --   ALBUMIN 3.8  --  3.8  --   --   --   --   AST 18  --  19  --   --   --   --   ALT 23  --  21  --   --   --   --   ALKPHOS 117  --  106  --   --   --   --   BILITOT 1.2  --  2.3*  --   --   --   --    < > = values in this interval not displayed.   Iron/TIBC/Ferritin/ %Sat    Component Value Date/Time   IRON 32 (L) 09/28/2019 1203   TIBC 336 09/28/2019 1203   FERRITIN 53 09/28/2019 1203   IRONPCTSAT 10 (L) 09/28/2019 1203      RADIOGRAPHIC STUDIES: I have personally reviewed the radiological images as listed and agreed with the findings in the report. DG Chest 2 View  Result Date: 10/09/2019 CLINICAL DATA:  Cough and shortness of breath for 2 days EXAM: CHEST - 2 VIEW COMPARISON:  10/07/2012 FINDINGS: Cardiac shadow is enlarged. Defibrillator is noted. The lungs are well aerated bilaterally. No focal infiltrate or sizable effusion  is seen. No bony abnormality is noted. IMPRESSION: No acute abnormality Electronically Signed   By: Inez Catalina M.D.   On: 10/09/2019 19:10   CT Renal Stone Study  Result Date: 10/10/2019 CLINICAL DATA:  Flank pain and fevers EXAM: CT ABDOMEN AND PELVIS WITHOUT CONTRAST TECHNIQUE: Multidetector CT imaging of the abdomen and pelvis was performed following the standard protocol without IV contrast. COMPARISON:  01/05/2011 FINDINGS: Lower chest: Small effusions are noted bilaterally right greater than left. No focal infiltrate is seen. Hepatobiliary: Gallbladder is well distended. Multiple dependent gallstones are seen. No pericholecystic fluid is noted. The liver demonstrates a normal appearance. Perihepatic fluid is noted consistent with ascites. Pancreas: Unremarkable. No pancreatic ductal dilatation or surrounding inflammatory changes. Spleen: Normal in size without focal abnormality. Adrenals/Urinary Tract: Adrenal glands are within normal limits. Kidneys demonstrate a normal appearance without obstructive change bilaterally. Lower pole renal  stone is noted on the left measuring 8-9 mm. This is slightly smaller than that seen on the prior exam. Right renal cyst is again noted and stable. Mild fullness of the collecting systems is seen without obstructing stone. Bladder has been surgically removed. A ileal loop is noted in the right mid abdomen. Stomach/Bowel: Diverticular change of the colon is noted without evidence of diverticulitis. No obstructive or inflammatory changes of the colon are seen. The appendix is within normal limits. Small bowel and stomach are unremarkable with the exception of postsurgical changes related to the ileal loop. Vascular/Lymphatic: Aortic atherosclerosis. No enlarged abdominal or pelvic lymph nodes. Reproductive: Prostate is not visualized consistent with a prior surgical history. Other: Free fluid is noted throughout the abdomen and pelvis. Some herniation of fluid through the  ostomy loop in the right mid abdomen is noted. Musculoskeletal: No acute or significant osseous findings. IMPRESSION: New ascites. Cholelithiasis without complicating factors. Nonobstructing left renal stone. Ileal loop is seen without definitive obstructive change. Diverticulosis without diverticulitis. Small effusions. Electronically Signed   By: Inez Catalina M.D.   On: 10/10/2019 00:08      ASSESSMENT & PLAN:  1. Anemia, unspecified type   2. Lymphocytopenia   3. History of bladder cancer   4. Stage 3b chronic kidney disease    Anemia: Likely combination of iron deficiency and anemia secondary to chronic disease. Labs are reviewed with patient.  Smear showed burr cells and anisocytosis.  Versus likely secondary to chronic kidney disease.  CBC showed hemoglobin of 12.9, lymphocyte 0.2.  LDH 113, peripheral blood flow cytometry showed no significant immunophenotypic abnormality detected.  Normal folate and vitamin B12 No M protein on myeloma labs.  Increased free light chain ratio likely secondary to chronic kidney disease Discussed with patient that I think anemia is secondary to chronic kidney disease. Iron panel showed decreased iron saturation to 10%.  I recommend patient to take oral iron supplementation and increase frequency to twice daily.  We also discussed possible need of IV Venofer treatments in the future.  #Chronic lymphocytopenia, work-up nonremarkable.  Reactive versus secondary to decreased marrow production. Patient's platelet count also dropped during his recent admission which can be reactive secondary to sepsis versus decreased marrow production.  Possibility of underlying MDS cannot be excluded.  We discussed about this possibility. Bone marrow biopsy can be considered in the future if his counts progressively gets worse. Patient has multiple other comorbidities and I do not think he will be a candidate for bone marrow transplant even if MDS is diagnosed.  Therefore I will  continue watchful waiting at this point.  History of bladder cancer, CT scan was recently done during the admission.  Patient has ascites which is also mentioned in his [facility] CT scanner report.  Likely secondary to chronic heart failure. No cancer recurrence noted on recent CTs.  Continue follow-up with neurology.  Orders Placed This Encounter  Procedures  . CBC with Differential/Platelet    Standing Status:   Future    Standing Expiration Date:   10/14/2020  . Comprehensive metabolic panel    Standing Status:   Future    Standing Expiration Date:   10/14/2020  . Vitamin B12    Standing Status:   Future    Standing Expiration Date:   10/14/2020  . Ferritin    Standing Status:   Future    Standing Expiration Date:   10/14/2020  . Iron and TIBC    Standing Status:   Future  Standing Expiration Date:   10/14/2020    All questions were answered. The patient knows to call the clinic with any problems questions or concerns.  cc Maryland Pink, MD    Return of visit: 6 months  Earlie Server, MD, PhD Hematology Oncology [facility] Pager- [phone] 10/15/2019

## 2019-10-15 NOTE — Progress Notes (Signed)
Pt here for follow up. Patient was in the hospital for a few days and was discharged with cefdinir. Unsure why he was put on antibiotic.  Pt believed he might have passed kidney stone while at hospital .

## 2020-03-23 ENCOUNTER — Other Ambulatory Visit: Payer: Self-pay | Admitting: Nephrology

## 2020-03-23 DIAGNOSIS — R809 Proteinuria, unspecified: Secondary | ICD-10-CM

## 2020-03-23 DIAGNOSIS — E1122 Type 2 diabetes mellitus with diabetic chronic kidney disease: Secondary | ICD-10-CM

## 2020-03-23 DIAGNOSIS — N184 Chronic kidney disease, stage 4 (severe): Secondary | ICD-10-CM

## 2020-03-31 ENCOUNTER — Other Ambulatory Visit: Payer: Self-pay

## 2020-03-31 ENCOUNTER — Ambulatory Visit
Admission: RE | Admit: 2020-03-31 | Discharge: 2020-03-31 | Disposition: A | Discharge status: Home / Self Care | Payer: Medicare Other | Source: Ambulatory Visit | Attending: Nephrology | Admitting: Nephrology

## 2020-03-31 DIAGNOSIS — R809 Proteinuria, unspecified: Secondary | ICD-10-CM | POA: Diagnosis present

## 2020-03-31 DIAGNOSIS — N184 Chronic kidney disease, stage 4 (severe): Secondary | ICD-10-CM | POA: Diagnosis present

## 2020-03-31 DIAGNOSIS — E1122 Type 2 diabetes mellitus with diabetic chronic kidney disease: Secondary | ICD-10-CM | POA: Insufficient documentation

## 2020-04-03 ENCOUNTER — Other Ambulatory Visit: Payer: Self-pay | Admitting: Gastroenterology

## 2020-04-03 DIAGNOSIS — R188 Other ascites: Secondary | ICD-10-CM

## 2020-04-04 ENCOUNTER — Other Ambulatory Visit: Payer: Self-pay | Admitting: Gastroenterology

## 2020-04-04 DIAGNOSIS — K7460 Unspecified cirrhosis of liver: Secondary | ICD-10-CM

## 2020-04-04 DIAGNOSIS — R7989 Other specified abnormal findings of blood chemistry: Secondary | ICD-10-CM

## 2020-04-05 ENCOUNTER — Other Ambulatory Visit
Admission: RE | Admit: 2020-04-05 | Discharge: 2020-04-05 | Disposition: A | Discharge status: Home / Self Care | Payer: Medicare Other | Source: Home / Self Care | Attending: Gastroenterology | Admitting: Gastroenterology

## 2020-04-05 ENCOUNTER — Ambulatory Visit
Admission: RE | Admit: 2020-04-05 | Discharge: 2020-04-05 | Disposition: A | Discharge status: Home / Self Care | Payer: Medicare Other | Source: Ambulatory Visit | Attending: Gastroenterology | Admitting: Gastroenterology

## 2020-04-05 ENCOUNTER — Other Ambulatory Visit: Payer: Self-pay

## 2020-04-05 DIAGNOSIS — R188 Other ascites: Secondary | ICD-10-CM | POA: Insufficient documentation

## 2020-04-05 DIAGNOSIS — R7989 Other specified abnormal findings of blood chemistry: Secondary | ICD-10-CM | POA: Diagnosis present

## 2020-04-05 DIAGNOSIS — K7460 Unspecified cirrhosis of liver: Secondary | ICD-10-CM | POA: Diagnosis present

## 2020-04-05 LAB — BODY FLUID CELL COUNT WITH DIFFERENTIAL
Eos, Fluid: 1 %
Lymphs, Fluid: 38 %
Monocyte-Macrophage-Serous Fluid: 61 %
Neutrophil Count, Fluid: 0 %
Total Nucleated Cell Count, Fluid: 211 cu mm

## 2020-04-05 LAB — ALBUMIN: Albumin: 3.6 g/dL (ref 3.5–5.0)

## 2020-04-05 LAB — SODIUM: Sodium: 143 mmol/L (ref 135–145)

## 2020-04-05 LAB — PROTEIN, PLEURAL OR PERITONEAL FLUID: Total protein, fluid: 3.4 g/dL

## 2020-04-05 LAB — ALBUMIN, PLEURAL OR PERITONEAL FLUID: Albumin, Fluid: 2 g/dL

## 2020-04-07 LAB — CYTOLOGY - NON PAP

## 2020-04-08 LAB — BODY FLUID CULTURE
Culture: NO GROWTH
Gram Stain: NONE SEEN

## 2020-04-17 ENCOUNTER — Other Ambulatory Visit: Payer: Self-pay

## 2020-04-17 ENCOUNTER — Telehealth: Payer: Self-pay

## 2020-04-17 ENCOUNTER — Inpatient Hospital Stay: Payer: Medicare Other | Attending: Oncology

## 2020-04-17 DIAGNOSIS — F1010 Alcohol abuse, uncomplicated: Secondary | ICD-10-CM | POA: Insufficient documentation

## 2020-04-17 DIAGNOSIS — Z8546 Personal history of malignant neoplasm of prostate: Secondary | ICD-10-CM | POA: Insufficient documentation

## 2020-04-17 DIAGNOSIS — I130 Hypertensive heart and chronic kidney disease with heart failure and stage 1 through stage 4 chronic kidney disease, or unspecified chronic kidney disease: Secondary | ICD-10-CM | POA: Diagnosis not present

## 2020-04-17 DIAGNOSIS — K7460 Unspecified cirrhosis of liver: Secondary | ICD-10-CM

## 2020-04-17 DIAGNOSIS — N189 Chronic kidney disease, unspecified: Secondary | ICD-10-CM | POA: Diagnosis not present

## 2020-04-17 DIAGNOSIS — Z85828 Personal history of other malignant neoplasm of skin: Secondary | ICD-10-CM | POA: Insufficient documentation

## 2020-04-17 DIAGNOSIS — D72810 Lymphocytopenia: Secondary | ICD-10-CM | POA: Diagnosis not present

## 2020-04-17 DIAGNOSIS — Z87891 Personal history of nicotine dependence: Secondary | ICD-10-CM | POA: Diagnosis not present

## 2020-04-17 DIAGNOSIS — K7030 Alcoholic cirrhosis of liver without ascites: Secondary | ICD-10-CM

## 2020-04-17 DIAGNOSIS — Z7984 Long term (current) use of oral hypoglycemic drugs: Secondary | ICD-10-CM | POA: Diagnosis not present

## 2020-04-17 DIAGNOSIS — D649 Anemia, unspecified: Secondary | ICD-10-CM | POA: Diagnosis not present

## 2020-04-17 DIAGNOSIS — I252 Old myocardial infarction: Secondary | ICD-10-CM | POA: Insufficient documentation

## 2020-04-17 DIAGNOSIS — K7031 Alcoholic cirrhosis of liver with ascites: Secondary | ICD-10-CM | POA: Insufficient documentation

## 2020-04-17 DIAGNOSIS — R188 Other ascites: Secondary | ICD-10-CM

## 2020-04-17 DIAGNOSIS — Z8551 Personal history of malignant neoplasm of bladder: Secondary | ICD-10-CM | POA: Diagnosis not present

## 2020-04-17 LAB — IRON AND TIBC
Iron: 80 ug/dL (ref 45–182)
Saturation Ratios: 23 % (ref 17.9–39.5)
TIBC: 351 ug/dL (ref 250–450)
UIBC: 271 ug/dL

## 2020-04-17 LAB — COMPREHENSIVE METABOLIC PANEL
ALT: 20 U/L (ref 0–44)
AST: 23 U/L (ref 15–41)
Albumin: 3.3 g/dL — ABNORMAL LOW (ref 3.5–5.0)
Alkaline Phosphatase: 147 U/L — ABNORMAL HIGH (ref 38–126)
Anion gap: 11 (ref 5–15)
BUN: 40 mg/dL — ABNORMAL HIGH (ref 8–23)
CO2: 23 mmol/L (ref 22–32)
Calcium: 8.4 mg/dL — ABNORMAL LOW (ref 8.9–10.3)
Chloride: 105 mmol/L (ref 98–111)
Creatinine, Ser: 2.29 mg/dL — ABNORMAL HIGH (ref 0.61–1.24)
GFR, Estimated: 30 mL/min — ABNORMAL LOW (ref >60)
Glucose, Bld: 100 mg/dL — ABNORMAL HIGH (ref 70–99)
Potassium: 3.5 mmol/L (ref 3.5–5.1)
Sodium: 139 mmol/L (ref 135–145)
Total Bilirubin: 2.1 mg/dL — ABNORMAL HIGH (ref 0.3–1.2)
Total Protein: 6.7 g/dL (ref 6.5–8.1)

## 2020-04-17 LAB — CBC WITH DIFFERENTIAL/PLATELET
Abs Immature Granulocytes: 0.01 10*3/uL (ref 0.00–0.07)
Basophils Absolute: 0 10*3/uL (ref 0.0–0.1)
Basophils Relative: 1 %
Eosinophils Absolute: 0.1 10*3/uL (ref 0.0–0.5)
Eosinophils Relative: 3 %
HCT: 34.5 % — ABNORMAL LOW (ref 39.0–52.0)
Hemoglobin: 11.1 g/dL — ABNORMAL LOW (ref 13.0–17.0)
Immature Granulocytes: 0 %
Lymphocytes Relative: 5 %
Lymphs Abs: 0.2 10*3/uL — ABNORMAL LOW (ref 0.7–4.0)
MCH: 28.3 pg (ref 26.0–34.0)
MCHC: 32.2 g/dL (ref 30.0–36.0)
MCV: 88 fL (ref 80.0–100.0)
Monocytes Absolute: 0.4 10*3/uL (ref 0.1–1.0)
Monocytes Relative: 11 %
Neutro Abs: 2.9 10*3/uL (ref 1.7–7.7)
Neutrophils Relative %: 80 %
Platelets: 190 10*3/uL (ref 150–400)
RBC: 3.92 MIL/uL — ABNORMAL LOW (ref 4.22–5.81)
RDW: 21.3 % — ABNORMAL HIGH (ref 11.5–15.5)
WBC: 3.6 10*3/uL — ABNORMAL LOW (ref 4.0–10.5)
nRBC: 0 % (ref 0.0–0.2)

## 2020-04-17 LAB — FERRITIN: Ferritin: 76 ng/mL (ref 24–336)

## 2020-04-17 LAB — HEPATITIS A ANTIBODY, TOTAL: hep A Total Ab: NONREACTIVE

## 2020-04-17 LAB — PROTIME-INR
INR: 1.6 — ABNORMAL HIGH (ref 0.8–1.2)
Prothrombin Time: 18.2 seconds — ABNORMAL HIGH (ref 11.4–15.2)

## 2020-04-17 LAB — HEPATITIS C ANTIBODY: HCV Ab: NONREACTIVE

## 2020-04-17 LAB — VITAMIN B12: Vitamin B-12: 1049 pg/mL — ABNORMAL HIGH (ref 180–914)

## 2020-04-17 NOTE — Telephone Encounter (Signed)
Faxed received requesting labs for [facility] Cornelia Copa [place], [place]) be added to patients scheduled labs today.  Orders have been entered.

## 2020-04-18 LAB — AFP TUMOR MARKER: AFP, Serum, Tumor Marker: 1.5 ng/mL (ref 0.0–8.3)

## 2020-04-18 LAB — MISC LABCORP TEST (SEND OUT)
Labcorp test code: 1560
Labcorp test code: 6643

## 2020-04-18 LAB — MITOCHONDRIAL ANTIBODIES: Mitochondrial M2 Ab, IgG: <20 Units (ref 0.0–20.0)

## 2020-04-18 LAB — ALPHA-1-ANTITRYPSIN: A-1 Antitrypsin, Ser: 152 mg/dL (ref 101–187)

## 2020-04-18 LAB — ANTINUCLEAR ANTIBODIES, IFA: ANA Ab, IFA: NEGATIVE

## 2020-04-19 ENCOUNTER — Encounter: Payer: Self-pay | Admitting: Oncology

## 2020-04-19 ENCOUNTER — Inpatient Hospital Stay (HOSPITAL_BASED_OUTPATIENT_CLINIC_OR_DEPARTMENT_OTHER): Payer: Medicare Other | Admitting: Oncology

## 2020-04-19 VITALS — BP 131/82 | HR 77 | Temp 96.3°F | Resp 18 | Wt 165.9 lb

## 2020-04-19 DIAGNOSIS — K7030 Alcoholic cirrhosis of liver without ascites: Secondary | ICD-10-CM | POA: Diagnosis not present

## 2020-04-19 DIAGNOSIS — D649 Anemia, unspecified: Secondary | ICD-10-CM

## 2020-04-19 DIAGNOSIS — Z8551 Personal history of malignant neoplasm of bladder: Secondary | ICD-10-CM

## 2020-04-19 DIAGNOSIS — D72810 Lymphocytopenia: Secondary | ICD-10-CM | POA: Diagnosis not present

## 2020-04-19 LAB — HEPATITIS B VIRUS (PROFILE VI)
HEP B CORE AB: NEGATIVE
HEP B CORE IGM: NEGATIVE
HEP B SURFACE AB: NONREACTIVE
HEP B SURFACE AG: NEGATIVE
Hep B E Ab: NEGATIVE
Hep B E Ag: NEGATIVE

## 2020-04-19 NOTE — Progress Notes (Signed)
Hematology/Oncology Consult note [facility] Telephone:([phone] [phone]   Patient Care Team: Maryland Pink, MD as PCP - General (Family Medicine)  REFERRING PROVIDER: Maryland Pink, MD  CHIEF COMPLAINTS/REASON FOR VISIT:  Follow-up foranemia  HISTORY OF PRESENTING ILLNESS:   Larry Mann is a  70 y.o.  male with PMH listed below was seen in consultation at the request of  Maryland Pink, MD  for evaluation of anemia I reviewed patient's previous medical records via care everywhere. 09/20/2019 CBC showed white count of 2.9, hemoglobin 12.3, platelet 156,000, decreased lymphocyte, increased neutrophil percentage, reviewed his previous lab results. Anemia appears to be chronic onset, duration since at least 2018. Patient has history of chronic kidney disease, CHF on Lasix, bilateral lower extremity swelling, history of MI, diabetes, history of basal cell carcinoma.  He takes nitrofurantoin chronically for suppression of recurrent UTI.  On chronic anticoagulation with Eliquis 5 mg twice daily.  Denies any bleeding events.  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  Accession #:  [id number]  Diagnosis:  FSA: Ureter margin, right distal, biopsy  - Perpendicular cross sections of benign urothelium and ureter wall  - No urothelial dysplasia, papillary urothelial neoplasm or invasive carcinoma  identified  FSB: Ureter, left distal margin, biopsy  - Longitudinalsections of benign urothelium and ureter wall  - No urothelial dysplasia, papillary urothelial neoplasm or invasive carcinoma  identified  C: Lymph node, left external iliac, regional node dissection  - No metastatic carcinoma identified (0/1)   D: Lymph nodes, right external iliac, regional node dissection  - No metastatic carcinoma identified (0/4)   E: Lymph nodes, left obturator, regional node dissection  - No metastatic  carcinoma identified (0/4 lymph node fragments)    F: Lymph nodes, right obturator, regional node dissection  - No metastatic carcinoma identified (0/5 lymph node fragments)  G: Bladder and prostate, cystoprostatectomy   Tumor histologic type:   - Papillary urothelial carcinoma, high grade, and urothelial carcinoma in situ,  non-invasive.    Tumor histologic grade [facility]): high  Microscopic tumor size: grossly 2.5 cm diameter  Microscopic extent of invasion: non-invasive  Tumor site/focality of tumor:Right lateral wall at right UVJ grossly, but  multifocal microscopically  Angiolymphatic space invasion: not identified  Associated epithelial lesions: see above  Ureters: UCIS left UVJ  Histologic assessment of surgical margins:    Ureter, left:negative     Ureter, right: negative     Urethra: negative    Perivesicular soft tissue: negative   Other significant findings: necrotizing granulomatous inflammation, consistent  with prior BCG treatment  Lymph nodes:Separately submitted, see specimens C-F, above, all negative,  total 0/14  Diagnoses of other organs or structures: Prostate and seminal vesicles: no  carcinoma identified  AJCC Stage (bladder): pTa/pTis  pN0  pMx (current case only)  Patient follows up with [facility] urology and was last seen on 09/02/2019. 12/21/2013, he also has a history of kidney stone extraction-100% calcium oxalate monohydrate stones  INTERVAL HISTORY Larry Mann is a 70 y.o. male who has above history reviewed by me today presents for follow up visit for anemia Problems and complaints are listed below: Patient reports feeling well.  He takes oral iron supplementation tolerates well.  He was accompanied by his wife. Patient has establish care with gastroenterology for cirrhosis and ascites work-up.  Review of Systems  Constitutional: Positive for fatigue. Negative for appetite change,  chills, fever and unexpected weight change.  HENT:   Negative for hearing loss and voice change.   Eyes: Negative for eye problems and icterus.  Respiratory: Negative for chest tightness, cough and shortness of breath.   Cardiovascular: Negative for chest pain and leg swelling.  Gastrointestinal: Negative for abdominal distention and abdominal pain.  Endocrine: Negative for hot flashes.  Genitourinary: Negative for difficulty urinating, dysuria and frequency.   Musculoskeletal: Negative for arthralgias.  Skin: Negative for itching and rash.  Neurological: Negative for light-headedness and numbness.  Hematological: Negative for adenopathy. Does not bruise/bleed easily.  Psychiatric/Behavioral: Negative for confusion.    MEDICAL HISTORY:  Past Medical History:  Diagnosis Date  . Anginal pain ([place])   . Basal cell carcinoma 06/20/2009   R nose supratip  . Bladder cancer ([place])   . Cancer ([place])   . Chicken pox   . Chronic kidney disease   . Coronary artery disease   . Diabetes mellitus without complication ([place])   . Headache   . Hypertension   . Myocardial infarction ([place])   . Plantar fasciitis   . Prostate cancer [facility])     SURGICAL HISTORY: Past Surgical History:  Procedure Laterality Date  . CARDIOVERSION N/A 04/23/2017   Procedure: CARDIOVERSION;  Surgeon: Corey Skains, MD;  Location: [facility] ORS;  Service: Cardiovascular;  Laterality: N/A;  . CARDIOVERSION N/A 03/27/2018   Procedure: CARDIOVERSION (CATH LAB);  Surgeon: Corey Skains, MD;  Location: [facility] ORS;  Service: Cardiovascular;  Laterality: N/A;  . COLONOSCOPY    . COLONOSCOPY WITH PROPOFOL N/A 10/27/2014   Procedure: COLONOSCOPY WITH PROPOFOL;  Surgeon: Manya Silvas, MD;  Location: [facility] ENDOSCOPY;  Service: Endoscopy;  Laterality: N/A;  . CORONARY ANGIOPLASTY    . CYSTECTOMY    . HERNIA REPAIR    . LITHOTRIPSY    . MOHS SURGERY    . PROSTATE SURGERY      SOCIAL HISTORY: Social History   Socioeconomic  History  . Marital status: Married    Spouse name: Not on file  . Number of children: Not on file  . Years of education: Not on file  . Highest education level: Not on file  Occupational History  . Not on file  Tobacco Use  . Smoking status: Former Smoker    Packs/day: 1.50    Years: 25.00    Pack years: 37.50    Quit date: 05/19/2001    Years since quitting: 18.9  . Smokeless tobacco: Never Used  Vaping Use  . Vaping Use: Never used  Substance and Sexual Activity  . Alcohol use: Yes    Comment: occasional  . Drug use: No  . Sexual activity: Not on file  Other Topics Concern  . Not on file  Social History Narrative  . Not on file   Social Determinants of Health   Financial Resource Strain: Not on file  Food Insecurity: Not on file  Transportation Needs: Not on file  Physical Activity: Not on file  Stress: Not on file  Social Connections: Not on file  Intimate Partner Violence: Not on file    FAMILY HISTORY: Family History  Problem Relation Age of Onset  . Multiple myeloma Mother     ALLERGIES:  has No Known Allergies.  MEDICATIONS:  Current Outpatient Medications  Medication Sig Dispense Refill  . atorvastatin (LIPITOR) 20 MG tablet Take 10 mg by mouth daily.     . carvedilol (COREG) 12.5 MG tablet Take 25 mg by mouth 2 (two) times daily with a meal.     .  dapagliflozin propanediol (FARXIGA) 10 MG TABS tablet Take 10 mg by mouth daily.     Marland Kitchen ELIQUIS 5 MG TABS tablet Take 5 mg by mouth 2 (two) times daily.  11  . ferrous sulfate 325 (65 FE) MG tablet Take 1 tablet by mouth daily.    Marland Kitchen glipiZIDE (GLUCOTROL XL) 5 MG 24 hr tablet Take 5 mg by mouth 2 (two) times daily.     . nitrofurantoin, macrocrystal-monohydrate, (MACROBID) 100 MG capsule Take 100 mg by mouth daily.    . potassium chloride (KLOR-CON) 10 MEQ tablet Take 10 mEq by mouth 2 (two) times daily.    . sacubitril-valsartan (ENTRESTO) 49-51 MG Take 1 tablet by mouth 2 (two) times daily.    Marland Kitchen torsemide  (DEMADEX) 20 MG tablet Take 20 mg by mouth 2 (two) times daily.    . vitamin B-12 (CYANOCOBALAMIN) 1000 MCG tablet Take 1,000 mcg by mouth daily.    . furosemide (LASIX) 40 MG tablet Take 20 mg by mouth daily. (Patient not taking: Reported on 04/19/2020)    . metFORMIN (GLUCOPHAGE) 1000 MG tablet Take 1,000 mg by mouth 2 (two) times daily with a meal. (Patient not taking: Reported on 04/19/2020)     No current facility-administered medications for this visit.     PHYSICAL EXAMINATION: ECOG PERFORMANCE STATUS: 1 - Symptomatic but completely ambulatory Vitals:   04/19/20 1021  BP: 131/82  Pulse: 77  Resp: 18  Temp: (!) 96.3 F (35.7 C)   Filed Weights   04/19/20 1021  Weight: 165 lb 14.4 oz (75.3 kg)    Physical Exam Constitutional:      General: He is not in acute distress. HENT:     Head: Normocephalic and atraumatic.  Eyes:     General: No scleral icterus. Cardiovascular:     Rate and Rhythm: Normal rate and regular rhythm.     Heart sounds: Normal heart sounds.  Pulmonary:     Effort: Pulmonary effort is normal. No respiratory distress.     Breath sounds: No wheezing.  Abdominal:     General: Bowel sounds are normal. There is no distension.     Palpations: Abdomen is soft.     Comments: + Urostomy bag  Musculoskeletal:        General: Swelling present. No deformity. Normal range of motion.     Cervical back: Normal range of motion and neck supple.     Comments: Bilateral lower extremity 2+ edema  Skin:    General: Skin is warm and dry.     Findings: No erythema or rash.  Neurological:     Mental Status: He is alert and oriented to person, place, and time. Mental status is at baseline.     Cranial Nerves: No cranial nerve deficit.     Coordination: Coordination normal.  Psychiatric:        Mood and Affect: Mood normal.     LABORATORY DATA:  I have reviewed the data as listed Lab Results  Component Value Date   WBC 3.6 (L) 04/17/2020   HGB 11.1 (L) 04/17/2020    HCT 34.5 (L) 04/17/2020   MCV 88.0 04/17/2020   PLT 190 04/17/2020   Recent Labs    09/28/19 1203 10/09/19 1826 10/10/19 0630 10/11/19 0523 10/12/19 0431 04/05/20 1158 04/17/20 1036  NA 140 141 138 141 137 143 139  K 4.1 3.5 3.2* 3.3* 3.1*  --  3.5  CL 114* 113* 111 113* 111  --  105  CO2 19*  18* 16* 19* 16*  --  23  GLUCOSE 147* 134* 90 60* 69*  --  100*  BUN 44* 40* 44* 52* 60*  --  40*  CREATININE 1.80* 2.24* 2.42* 2.89* 2.64*  --  2.29*  CALCIUM 8.9 8.4* 8.3* 8.0* 8.0*  --  8.4*  GFRNONAA 38* 29* 27* 21* 24*  --  30*  GFRAA 44* 34* 31* 25* 28*  --   --   PROT 6.6 6.5  --   --   --   --  6.7  ALBUMIN 3.8 3.8  --   --   --  3.6 3.3*  AST 18 19  --   --   --   --  23  ALT 23 21  --   --   --   --  20  ALKPHOS 117 106  --   --   --   --  147*  BILITOT 1.2 2.3*  --   --   --   --  2.1*   Iron/TIBC/Ferritin/ %Sat    Component Value Date/Time   IRON 80 04/17/2020 1036   TIBC 351 04/17/2020 1036   FERRITIN 76 04/17/2020 1036   IRONPCTSAT 23 04/17/2020 1036      RADIOGRAPHIC STUDIES: I have personally reviewed the radiological images as listed and agreed with the findings in the report. US RENAL  Result Date: 03/31/2020 CLINICAL DATA:  Chronic kidney disease, type 2 diabetes. History of urostomy and prostatectomy in 2015 EXAM: RENAL / URINARY TRACT ULTRASOUND COMPLETE COMPARISON:  CT renal 10/09/2019 FINDINGS: Right Kidney: Renal measurements: 9.5 x 4.5 x 5.7 cm = volume: 127 mL. Echogenicity increased. There is redemonstration of a 4.3 x 2.7 x 4.8 cm cystic lesion within the right kidney that likely represents a simple renal cyst. No solid renal mass or hydronephrosis visualized. Left Kidney: Renal measurements: 10.1 x 5.2 x 4.9 cm = volume: 135 mL. Shadowing renal calculus measuring up to 9 mm. Multiple cystic lesions measuring up to 1.4 x 1.3 x 1.4 cm in the superior renal pole likely represent simple renal cysts. Echogenicity increased. No solid renal mass or  hydronephrosis visualized. Urinary bladder: Surgically removed. Other: Moderate volume simple appearing ascites. IMPRESSION: 1. Moderate volume, simple appearing, ascites. 2. Nonobstructive 9 mm left nephrolithiasis. 3. Increased renal echogenicity suggestive of renal parenchymal disease. 4. Urinary bladder is surgically removed. Electronically Signed   By: Iven Finn M.D.   On: 03/31/2020 20:26   US Paracentesis  Result Date: 04/05/2020 INDICATION: Ascites. EXAM: ULTRASOUND GUIDED PARACENTESIS MEDICATIONS: None. COMPLICATIONS: None immediate. PROCEDURE: Informed written consent was obtained from the patient after a discussion of the risks, benefits and alternatives to treatment. A timeout was performed prior to the initiation of the procedure. A time-out was performed prior to initiating the procedure. Initial ultrasound was performed to localize ascites. The right lower abdomen was prepped and draped in the usual sterile fashion. 1% lidocaine was used for local anesthesia. Following this, a 6 Fr Safe-T-Centesis catheter was introduced. An ultrasound image was saved for documentation purposes. The paracentesis was performed. The catheter was removed and a dressing was applied. The patient tolerated the procedure well without immediate post procedural complication. FINDINGS: A total of approximately 2 L of amber color fluid was removed. Samples were sent to the laboratory as requested by the clinical team. IMPRESSION: Successful ultrasound-guided paracentesis yielding 2 liters of peritoneal fluid. Electronically Signed   By: Aletta Edouard M.D.   On: 04/05/2020 13:41  US Abdomen Limited RUQ (LIVER/GB)  Result Date: 04/05/2020 CLINICAL DATA:  Elevated liver function studies. Cirrhosis of liver with ascites. EXAM: ULTRASOUND ABDOMEN LIMITED RIGHT UPPER QUADRANT COMPARISON:  CT 10/09/2019 FINDINGS: Gallbladder: Multiple gallstones, largest measuring about 9 mm in diameter. Diffuse gallbladder wall  thickening is likely related to hepatic cirrhosis. Murphy's sign is negative. Common bile duct: Diameter: 3 mm, normal Liver: Heterogeneous liver parenchymal echotexture with nodular contour consistent with hepatic cirrhosis. No focal liver lesions are demonstrated. Portal vein is patent on color Doppler imaging with normal direction of blood flow towards the liver. Other: Upper abdominal ascites.  Right pleural effusion. IMPRESSION: Cholelithiasis. Diffuse gallbladder wall thickening, likely related to hepatic cirrhosis. Upper abdominal ascites. Right pleural effusion. Electronically Signed   By: Lucienne Capers M.D.   On: 04/05/2020 20:05      ASSESSMENT & PLAN:  1. Alcoholic cirrhosis of liver without ascites ([place])   2. Anemia, unspecified type   3. Lymphocytopenia   4. History of bladder cancer    Anemia: Likely combination of iron deficiency and anemia secondary to chronic disease. Labs are reviewed and discussed with patient. Hemoglobin has remained stable at 11.1.  Continue oral iron supplementation. Continue monitor.Marland Kitchen  #Chronic lymphocytopenia, work-up nonremarkable.  Reactive vs secondary to decreased marrow production. Patient has multiple other comorbidities and I do not think he will be a candidate for bone marrow transplant even if MDS is diagnosed.  Therefore I will continue watchful waiting at this point.  Patient agrees with the plan.  #Cirrhosis, ascites.  Patient has establish care with [facility] gastroenterology.  Patient had cirrhosis work-up labs done at that time [place] along with other blood work.  I forwarded results to GI to follow-up. # History of bladder cancer, Patient follows up with [facility] urology and was last seen on 09/02/2019.  Orders Placed This Encounter  Procedures  . CBC with Differential/Platelet    Standing Status:   Future    Standing Expiration Date:   04/19/2021  . Comprehensive metabolic panel    Standing Status:   Future    Standing Expiration  Date:   04/19/2021  . AFP tumor marker    Standing Status:   Future    Standing Expiration Date:   04/19/2021    All questions were answered. The patient knows to call the clinic with any problems questions or concerns.  cc Maryland Pink, MD    Return of visit: 6 months  Earlie Server, MD, PhD Hematology Oncology [facility] at [facility] Pager- [phone] 04/19/2020

## 2020-04-19 NOTE — Progress Notes (Signed)
Patient here for follow up. Pt is being followed by GI and nephrology.

## 2020-07-11 ENCOUNTER — Other Ambulatory Visit: Payer: Self-pay

## 2020-07-11 ENCOUNTER — Emergency Department
Admission: EM | Admit: 2020-07-11 | Discharge: 2020-07-11 | Disposition: A | Discharge status: Home / Self Care | Payer: Medicare Other | Attending: Emergency Medicine | Admitting: Emergency Medicine

## 2020-07-11 DIAGNOSIS — Z7984 Long term (current) use of oral hypoglycemic drugs: Secondary | ICD-10-CM | POA: Insufficient documentation

## 2020-07-11 DIAGNOSIS — Z79899 Other long term (current) drug therapy: Secondary | ICD-10-CM | POA: Diagnosis not present

## 2020-07-11 DIAGNOSIS — I2510 Atherosclerotic heart disease of native coronary artery without angina pectoris: Secondary | ICD-10-CM | POA: Insufficient documentation

## 2020-07-11 DIAGNOSIS — N1832 Chronic kidney disease, stage 3b: Secondary | ICD-10-CM | POA: Insufficient documentation

## 2020-07-11 DIAGNOSIS — Z8546 Personal history of malignant neoplasm of prostate: Secondary | ICD-10-CM | POA: Diagnosis not present

## 2020-07-11 DIAGNOSIS — N390 Urinary tract infection, site not specified: Secondary | ICD-10-CM | POA: Insufficient documentation

## 2020-07-11 DIAGNOSIS — I129 Hypertensive chronic kidney disease with stage 1 through stage 4 chronic kidney disease, or unspecified chronic kidney disease: Secondary | ICD-10-CM | POA: Insufficient documentation

## 2020-07-11 DIAGNOSIS — Z9889 Other specified postprocedural states: Secondary | ICD-10-CM

## 2020-07-11 DIAGNOSIS — Z87891 Personal history of nicotine dependence: Secondary | ICD-10-CM | POA: Diagnosis not present

## 2020-07-11 DIAGNOSIS — R509 Fever, unspecified: Secondary | ICD-10-CM | POA: Diagnosis present

## 2020-07-11 DIAGNOSIS — E1122 Type 2 diabetes mellitus with diabetic chronic kidney disease: Secondary | ICD-10-CM | POA: Insufficient documentation

## 2020-07-11 DIAGNOSIS — Z8551 Personal history of malignant neoplasm of bladder: Secondary | ICD-10-CM | POA: Insufficient documentation

## 2020-07-11 DIAGNOSIS — Z7901 Long term (current) use of anticoagulants: Secondary | ICD-10-CM | POA: Diagnosis not present

## 2020-07-11 LAB — BASIC METABOLIC PANEL
Anion gap: 9 (ref 5–15)
BUN: 45 mg/dL — ABNORMAL HIGH (ref 8–23)
CO2: 21 mmol/L — ABNORMAL LOW (ref 22–32)
Calcium: 8.7 mg/dL — ABNORMAL LOW (ref 8.9–10.3)
Chloride: 108 mmol/L (ref 98–111)
Creatinine, Ser: 2.65 mg/dL — ABNORMAL HIGH (ref 0.61–1.24)
GFR, Estimated: 25 mL/min — ABNORMAL LOW (ref >60)
Glucose, Bld: 77 mg/dL (ref 70–99)
Potassium: 3.4 mmol/L — ABNORMAL LOW (ref 3.5–5.1)
Sodium: 138 mmol/L (ref 135–145)

## 2020-07-11 LAB — CBC WITH DIFFERENTIAL/PLATELET
Abs Immature Granulocytes: 0.01 10*3/uL (ref 0.00–0.07)
Basophils Absolute: 0 10*3/uL (ref 0.0–0.1)
Basophils Relative: 0 %
Eosinophils Absolute: 0 10*3/uL (ref 0.0–0.5)
Eosinophils Relative: 0 %
HCT: 38.8 % — ABNORMAL LOW (ref 39.0–52.0)
Hemoglobin: 12.9 g/dL — ABNORMAL LOW (ref 13.0–17.0)
Immature Granulocytes: 0 %
Lymphocytes Relative: 3 %
Lymphs Abs: 0.1 10*3/uL — ABNORMAL LOW (ref 0.7–4.0)
MCH: 29.9 pg (ref 26.0–34.0)
MCHC: 33.2 g/dL (ref 30.0–36.0)
MCV: 90 fL (ref 80.0–100.0)
Monocytes Absolute: 0.3 10*3/uL (ref 0.1–1.0)
Monocytes Relative: 8 %
Neutro Abs: 3.5 10*3/uL (ref 1.7–7.7)
Neutrophils Relative %: 89 %
Platelets: 120 10*3/uL — ABNORMAL LOW (ref 150–400)
RBC: 4.31 MIL/uL (ref 4.22–5.81)
RDW: 17 % — ABNORMAL HIGH (ref 11.5–15.5)
WBC: 4 10*3/uL (ref 4.0–10.5)
nRBC: 0 % (ref 0.0–0.2)

## 2020-07-11 LAB — URINALYSIS, COMPLETE (UACMP) WITH MICROSCOPIC
Bilirubin Urine: NEGATIVE
Glucose, UA: NEGATIVE mg/dL
Ketones, ur: NEGATIVE mg/dL
Nitrite: NEGATIVE
Protein, ur: 100 mg/dL — AB
Specific Gravity, Urine: 1.011 (ref 1.005–1.030)
Squamous Epithelial / HPF: NONE SEEN (ref 0–5)
WBC, UA: >50 WBC/hpf — ABNORMAL HIGH (ref 0–5)
pH: 9 — ABNORMAL HIGH (ref 5.0–8.0)

## 2020-07-11 MED ORDER — SODIUM CHLORIDE 0.9 % IV BOLUS: 1000.0000 mL | Freq: Once | INTRAVENOUS | Status: DC

## 2020-07-11 MED ORDER — ONDANSETRON HCL 4 MG/2ML IJ SOLN: 4.0000 mg | Freq: Once | INTRAMUSCULAR | Status: DC

## 2020-07-11 MED ORDER — CEFDINIR 300 MG PO CAPS
300.0000 mg | ORAL_CAPSULE | Freq: Once | ORAL | Status: AC
  Administered 2020-07-11: 300 mg via ORAL
  Filled 2020-07-11: qty 1

## 2020-07-11 MED ORDER — CEFDINIR 300 MG PO CAPS: 300.0000 mg | ORAL_CAPSULE | Freq: Two times a day (BID) | ORAL | 0 refills | Status: AC

## 2020-07-11 MED ORDER — ONDANSETRON 4 MG PO TBDP: 4.0000 mg | ORAL_TABLET | Freq: Three times a day (TID) | ORAL | 0 refills | Status: DC | PRN

## 2020-07-11 NOTE — ED Notes (Signed)
VSS. NAD. Pt is ambulatory to POV without difficulty. All questions and concerns were addressed.

## 2020-07-11 NOTE — ED Triage Notes (Addendum)
Pt states starting yesterday he began fever, emesis and shortness of breath, fatigue. Pt was seen at urgent care earlier today and was told he was possibly uroseptic. Pt states he has urostomy. Pt denies pain states he was tested for covid and flu at urgent care and both were negative

## 2020-07-11 NOTE — ED Notes (Signed)
Went and to tell pt that I was going to start an IV and he wanted to wait to speak with the MD. He reports that he is no longer nauseated and has been able to water down since being here.

## 2020-07-11 NOTE — ED Provider Notes (Signed)
[facility] Emergency Department Provider Note   ____________________________________________   Event Date/Time   First MD Initiated Contact with Patient 07/11/20 1956     (approximate)  I have reviewed the triage vital signs and the nursing notes.   HISTORY  Chief Complaint Fever and Emesis    HPI Larry Mann is a 70 y.o. male with below stated past medical history and of significance a urostomy in place who presents from urgent care after a positive UA for UTI and concerns that patient may be septic.  Patient endorses some lower abdominal pain with intermittent emesis of a small amount of gastric material that he states is not quite emesis.  States this pain has been worsening over the past 3 days and prompted the visit to urgent care.  Patient describes a dull burning sensation to the suprapubic area that has no exacerbating or relieving factors.  This pain does not radiate.  Patient denies any fevers, chills, myalgias, or other viral symptoms.  Patient currently denies any vision changes, tinnitus, difficulty speaking, facial droop, sore throat, chest pain, shortness of breath, diarrhea, dysuria, or weakness/numbness/paresthesias in any extremity         Past Medical History:  Diagnosis Date  . Anginal pain ([place])   . Basal cell carcinoma 06/20/2009   R nose supratip  . Bladder cancer ([place])   . Cancer ([place])   . Chicken pox   . Chronic kidney disease   . Coronary artery disease   . Diabetes mellitus without complication ([place])   . Headache   . Hypertension   . Myocardial infarction ([place])   . Plantar fasciitis   . Prostate cancer [facility])     Patient Active Problem List   Diagnosis Date Noted  . Severe sepsis ([place]) 10/10/2019  . Acute renal failure superimposed on stage 3b chronic kidney disease ([place]) 10/10/2019  . Coronary artery disease   . Cardiomyopathy ([place])   . Elevated troponin   . Diabetes mellitus ([place]) 11/14/2016  . Diabetes  mellitus type 2, uncomplicated ([place]) 68/34/1962  . HTN (hypertension) 11/14/2016  . 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 Benign essential hypertension 11/10/2014  . Bladder cancer ([place]) 10/18/2013    Past Surgical History:  Procedure Laterality Date  . CARDIOVERSION N/A 04/23/2017   Procedure: CARDIOVERSION;  Surgeon: Corey Skains, MD;  Location: [facility] ORS;  Service: Cardiovascular;  Laterality: N/A;  . CARDIOVERSION N/A 03/27/2018   Procedure: CARDIOVERSION (CATH LAB);  Surgeon: Corey Skains, MD;  Location: [facility] ORS;  Service: Cardiovascular;  Laterality: N/A;  . COLONOSCOPY    . COLONOSCOPY WITH PROPOFOL N/A 10/27/2014   Procedure: COLONOSCOPY WITH PROPOFOL;  Surgeon: Manya Silvas, MD;  Location: [facility] ENDOSCOPY;  Service: Endoscopy;  Laterality: N/A;  . CORONARY ANGIOPLASTY    . CYSTECTOMY    . HERNIA REPAIR    . LITHOTRIPSY    . MOHS SURGERY    . PROSTATE SURGERY      Prior to Admission medications   Medication Sig Start Date End Date Taking? Authorizing Provider  cefdinir (OMNICEF) 300 MG capsule Take 1 capsule (300 mg total) by mouth 2 (two) times daily for 5 days. 07/11/20 07/16/20 Yes Teagen Mcleary, [place] Lawman, MD  ondansetron (ZOFRAN ODT) 4 MG disintegrating tablet Take 1 tablet (4 mg total) by mouth every 8 (eight) hours as needed for nausea or vomiting. 07/11/20  Yes Naaman Plummer, MD  atorvastatin (LIPITOR) 20 MG tablet Take 10 mg by mouth daily.  [provider]  carvedilol (COREG) 12.5 MG tablet Take 25 mg by mouth 2 (two) times daily with a meal.     [provider]  ELIQUIS 5 MG TABS tablet Take 5 mg by mouth 2 (two) times daily. 03/24/17   [provider]  ferrous sulfate 325 (65 FE) MG tablet Take 1 tablet by mouth daily.    [provider]  furosemide (LASIX) 40 MG tablet Take 20 mg by mouth daily. Patient not taking: Reported on 04/19/2020 07/21/19   [provider]  glipiZIDE (GLUCOTROL XL) 5 MG 24 hr tablet  Take 5 mg by mouth 2 (two) times daily.     [provider]  metFORMIN (GLUCOPHAGE) 1000 MG tablet Take 1,000 mg by mouth 2 (two) times daily with a meal. Patient not taking: Reported on 04/19/2020    [provider]  nitrofurantoin, macrocrystal-monohydrate, (MACROBID) 100 MG capsule Take 100 mg by mouth daily. 02/22/20   [provider]  potassium chloride (KLOR-CON) 10 MEQ tablet Take 10 mEq by mouth 2 (two) times daily. 02/22/20   [provider]  sacubitril-valsartan (ENTRESTO) 49-51 MG Take 1 tablet by mouth 2 (two) times daily.    [provider]  torsemide (DEMADEX) 20 MG tablet Take 20 mg by mouth 2 (two) times daily. 02/12/20   [provider]  vitamin B-12 (CYANOCOBALAMIN) 1000 MCG tablet Take 1,000 mcg by mouth daily. 09/02/19   [provider]    Allergies Patient has no known allergies.  Family History  Problem Relation Age of Onset  . Multiple myeloma Mother     Social History Social History   Tobacco Use  . Smoking status: Former Smoker    Packs/day: 1.50    Years: 25.00    Pack years: 59.50    Quit date: 05/19/2001    Years since quitting: 19.1  . Smokeless tobacco: Never Used  Vaping Use  . Vaping Use: Never used  Substance Use Topics  . Alcohol use: Yes    Comment: occasional  . Drug use: No    Review of Systems Constitutional: No fever/chills Eyes: No visual changes. ENT: No sore throat. Cardiovascular: Denies chest pain. Respiratory: Denies shortness of breath. Gastrointestinal: Endorses suprapubic abdominal pain, nausea, and vomiting.  No diarrhea. Genitourinary: Negative for dysuria. Musculoskeletal: Negative for acute arthralgias Skin: Negative for rash. Neurological: Negative for headaches, weakness/numbness/paresthesias in any extremity Psychiatric: Negative for suicidal ideation/homicidal ideation   ____________________________________________   PHYSICAL EXAM:  VITAL SIGNS: ED  Triage Vitals [07/11/20 1901]  Enc Vitals Group     BP 116/80     Pulse Rate 83     Resp 18     Temp 98.4 F (36.9 C)     Temp Source Oral     SpO2 96 %     Weight 161 lb (73 kg)     Height 5' 6"  (1.676 m)     Head Circumference      Peak Flow      Pain Score 0     Pain Loc      Pain Edu?      Excl. in [place]?    Constitutional: Alert and oriented. Well appearing and in no acute distress. Eyes: Conjunctivae are normal. PERRL. Head: Atraumatic. Nose: No congestion/rhinnorhea. Mouth/Throat: Mucous membranes are moist. Neck: No stridor Cardiovascular: Grossly normal heart sounds.  Good peripheral circulation. Respiratory: Normal respiratory effort.  No retractions. Gastrointestinal: Soft and mild tenderness palpation in the suprapubic region with urostomy  in place without surrounding erythema. No distention. Musculoskeletal: No obvious deformities Neurologic:  Normal speech and language. No gross focal neurologic deficits are appreciated. Skin:  Skin is warm and dry. No rash noted. Psychiatric: Mood and affect are normal. Speech and behavior are normal.  ____________________________________________   LABS (all labs ordered are listed, but only abnormal results are displayed)  Labs Reviewed  CBC WITH DIFFERENTIAL/PLATELET - Abnormal; Notable for the following components:      Result Value   Hemoglobin 12.9 (*)    HCT 38.8 (*)    RDW 17.0 (*)    Platelets 120 (*)    Lymphs Abs 0.1 (*)    All other components within normal limits  BASIC METABOLIC PANEL - Abnormal; Notable for the following components:   Potassium 3.4 (*)    CO2 21 (*)    BUN 45 (*)    Creatinine, Ser 2.65 (*)    Calcium 8.7 (*)    GFR, Estimated 25 (*)    All other components within normal limits  URINALYSIS, COMPLETE (UACMP) WITH MICROSCOPIC - Abnormal; Notable for the following components:   Color, Urine AMBER (*)    APPearance CLOUDY (*)    pH 9.0 (*)    Hgb urine dipstick MODERATE (*)    Protein,  ur 100 (*)    Leukocytes,Ua LARGE (*)    WBC, UA >50 (*)    Bacteria, UA MANY (*)    All other components within normal limits    PROCEDURES  Procedure(s) performed (including Critical Care):  Procedures   ____________________________________________   INITIAL IMPRESSION / ASSESSMENT AND PLAN / ED COURSE  As part of my medical decision making, I reviewed the following data within the [place] notes reviewed and incorporated, Labs reviewed, EKG interpreted, Old chart reviewed, Radiograph reviewed and Notes from prior ED visits reviewed and incorporated        No e/o epididymo-orchitis on exam and low suspicion for rectal abscess, prostatitis, other GU deep space infection, gonorrhea/chlamydia. Unlikely Infected Urolithiasis, AAA, cholecystitis, pancreatitis, SBO, appendicitis, or other acute abdomen. Workup: UA: None Rx: Cefdinir 300 mg twice daily x5 days  Disposition: Discharge home. SRP discussed. Advise follow up with primary care provider within 24-72 hours.      ____________________________________________   FINAL CLINICAL IMPRESSION(S) / ED DIAGNOSES  Final diagnoses:  Lower urinary tract infectious disease  History of urostomy     ED Discharge Orders         Ordered    cefdinir (OMNICEF) 300 MG capsule  2 times daily        07/11/20 2010    ondansetron (ZOFRAN ODT) 4 MG disintegrating tablet  Every 8 hours PRN        07/11/20 2010           Note:  This document was prepared using Dragon voice recognition software and may include unintentional dictation errors.   Naaman Plummer, MD 07/11/20 2015

## 2020-08-18 ENCOUNTER — Ambulatory Visit (INDEPENDENT_AMBULATORY_CARE_PROVIDER_SITE_OTHER): Payer: Medicare Other | Admitting: Dermatology

## 2020-08-18 ENCOUNTER — Other Ambulatory Visit: Payer: Self-pay

## 2020-08-18 DIAGNOSIS — Z85828 Personal history of other malignant neoplasm of skin: Secondary | ICD-10-CM | POA: Diagnosis not present

## 2020-08-18 DIAGNOSIS — D1800 Hemangioma unspecified site: Secondary | ICD-10-CM

## 2020-08-18 DIAGNOSIS — L821 Other seborrheic keratosis: Secondary | ICD-10-CM

## 2020-08-18 DIAGNOSIS — L720 Epidermal cyst: Secondary | ICD-10-CM

## 2020-08-18 DIAGNOSIS — Z1283 Encounter for screening for malignant neoplasm of skin: Secondary | ICD-10-CM | POA: Diagnosis not present

## 2020-08-18 DIAGNOSIS — L578 Other skin changes due to chronic exposure to nonionizing radiation: Secondary | ICD-10-CM

## 2020-08-18 DIAGNOSIS — L814 Other melanin hyperpigmentation: Secondary | ICD-10-CM

## 2020-08-18 DIAGNOSIS — D229 Melanocytic nevi, unspecified: Secondary | ICD-10-CM

## 2020-08-18 DIAGNOSIS — I872 Venous insufficiency (chronic) (peripheral): Secondary | ICD-10-CM

## 2020-08-18 NOTE — Progress Notes (Signed)
   Follow-Up Visit   Subjective  Larry Mann is a 70 y.o. male who presents for the following: Total body skin exam (Hx of BCC R nose supratip). The patient presents for Total-Body Skin Exam (TBSE) for skin cancer screening and mole check.  The following portions of the chart were reviewed this encounter and updated as appropriate:   Tobacco  Allergies  Meds  Problems  Med Hx  Surg Hx  Fam Hx      Review of Systems:  No other skin or systemic complaints except as noted in HPI or Assessment and Plan.  Objective  Well appearing patient in no apparent distress; mood and affect are within normal limits.  A full examination was performed including scalp, head, eyes, ears, nose, lips, neck, chest, axillae, abdomen, back, buttocks, bilateral upper extremities, bilateral lower extremities, hands, feet, fingers, toes, fingernails, and toenails. All findings within normal limits unless otherwise noted below.  R nose supratip Well healed scar with no evidence of recurrence.   L neck Cystic pap  bil lower legs Severe stasis changes with edema   Assessment & Plan   Lentigines - Scattered tan macules - Due to sun exposure - Benign-appering, observe - Recommend daily broad spectrum sunscreen SPF 30+ to sun-exposed areas, reapply every 2 hours as needed. - Call for any changes  Seborrheic Keratoses - Stuck-on, waxy, tan-brown papules and/or plaques  - Benign-appearing - Discussed benign etiology and prognosis. - Observe - Call for any changes  Melanocytic Nevi - Tan-brown and/or pink-flesh-colored symmetric macules and papules - Benign appearing on exam today - Observation - Call clinic for new or changing moles - Recommend daily use of broad spectrum spf 30+ sunscreen to sun-exposed areas.   Hemangiomas - Red papules - Discussed benign nature - Observe - Call for any changes  Actinic Damage - Chronic condition, secondary to cumulative UV/sun exposure - diffuse  scaly erythematous macules with underlying dyspigmentation - Recommend daily broad spectrum sunscreen SPF 30+ to sun-exposed areas, reapply every 2 hours as needed.  - Staying in the shade or wearing long sleeves, sun glasses (UVA+UVB protection) and wide brim hats (4-inch brim around the entire circumference of the hat) are also recommended for sun protection.  - Call for new or changing lesions.  Skin cancer screening performed today.  History of basal cell carcinoma (BCC) R nose supratip Clear. Observe for recurrence. Call clinic for new or changing lesions.  Recommend regular skin exams, daily broad-spectrum spf 30+ sunscreen use, and photoprotection.     Epidermal cyst L neck Benign, observe  Stasis dermatitis of both legs-complicated by congestive heart failure hypertension renal failure and post MI bil lower legs Severe with Edema Recommend graduated compression socks Stasis in the legs causes chronic leg swelling, which may result in itchy or painful rashes, skin discoloration, skin texture changes, and sometimes ulceration.  Recommend daily compression hose/stockings- easiest to put on first thing in morning, remove at bedtime.  Elevate legs as much as possible. Avoid salt/sodium rich foods.  HX of Congestive Heart Failure, HTN, Renal Failure, Post MI.  Return in about 1 year (around 08/18/2021) for TBSE, Hx of BCC.  I, Larry Mann, RMA, am acting as scribe for [name], MD . Documentation: I have reviewed the above documentation for accuracy and completeness, and I agree with the above.  [name], MD

## 2020-08-18 NOTE — Patient Instructions (Signed)

## 2020-08-22 ENCOUNTER — Encounter: Payer: Self-pay | Admitting: Dermatology

## 2020-08-24 ENCOUNTER — Encounter: Payer: PRIVATE HEALTH INSURANCE | Admitting: Dermatology

## 2020-10-13 ENCOUNTER — Other Ambulatory Visit: Payer: PRIVATE HEALTH INSURANCE

## 2020-10-13 ENCOUNTER — Other Ambulatory Visit: Payer: Self-pay | Admitting: Gastroenterology

## 2020-10-13 DIAGNOSIS — K7460 Unspecified cirrhosis of liver: Secondary | ICD-10-CM

## 2020-10-17 ENCOUNTER — Ambulatory Visit: Payer: PRIVATE HEALTH INSURANCE | Admitting: Oncology

## 2020-10-17 ENCOUNTER — Other Ambulatory Visit: Payer: Self-pay | Admitting: Radiology

## 2020-10-18 ENCOUNTER — Other Ambulatory Visit
Admission: RE | Admit: 2020-10-18 | Discharge: 2020-10-18 | Disposition: A | Discharge status: Home / Self Care | Payer: Medicare Other | Source: Ambulatory Visit | Attending: Gastroenterology | Admitting: Gastroenterology

## 2020-10-18 ENCOUNTER — Ambulatory Visit
Admission: RE | Admit: 2020-10-18 | Discharge: 2020-10-18 | Disposition: A | Discharge status: Home / Self Care | Payer: Medicare Other | Source: Ambulatory Visit | Attending: Gastroenterology | Admitting: Gastroenterology

## 2020-10-18 ENCOUNTER — Other Ambulatory Visit: Payer: Self-pay

## 2020-10-18 DIAGNOSIS — R188 Other ascites: Secondary | ICD-10-CM | POA: Diagnosis present

## 2020-10-18 DIAGNOSIS — K7460 Unspecified cirrhosis of liver: Secondary | ICD-10-CM | POA: Diagnosis not present

## 2020-10-18 LAB — CBC WITH DIFFERENTIAL/PLATELET
Abs Immature Granulocytes: 0.01 10*3/uL (ref 0.00–0.07)
Basophils Absolute: 0 10*3/uL (ref 0.0–0.1)
Basophils Relative: 1 %
Eosinophils Absolute: 0.1 10*3/uL (ref 0.0–0.5)
Eosinophils Relative: 3 %
HCT: 41.5 % (ref 39.0–52.0)
Hemoglobin: 13.5 g/dL (ref 13.0–17.0)
Immature Granulocytes: 0 %
Lymphocytes Relative: 10 %
Lymphs Abs: 0.3 10*3/uL — ABNORMAL LOW (ref 0.7–4.0)
MCH: 31 pg (ref 26.0–34.0)
MCHC: 32.5 g/dL (ref 30.0–36.0)
MCV: 95.2 fL (ref 80.0–100.0)
Monocytes Absolute: 0.4 10*3/uL (ref 0.1–1.0)
Monocytes Relative: 15 %
Neutro Abs: 1.9 10*3/uL (ref 1.7–7.7)
Neutrophils Relative %: 71 %
Platelets: 122 10*3/uL — ABNORMAL LOW (ref 150–400)
RBC: 4.36 MIL/uL (ref 4.22–5.81)
RDW: 18.1 % — ABNORMAL HIGH (ref 11.5–15.5)
WBC: 2.7 10*3/uL — ABNORMAL LOW (ref 4.0–10.5)
nRBC: 0 % (ref 0.0–0.2)

## 2020-10-18 LAB — BODY FLUID CELL COUNT WITH DIFFERENTIAL
Eos, Fluid: 0 %
Lymphs, Fluid: 19 %
Monocyte-Macrophage-Serous Fluid: 78 %
Neutrophil Count, Fluid: 2 %
Total Nucleated Cell Count, Fluid: 278 cu mm

## 2020-10-18 LAB — PROTEIN, PLEURAL OR PERITONEAL FLUID: Total protein, fluid: 3.8 g/dL

## 2020-10-18 LAB — COMPREHENSIVE METABOLIC PANEL
ALT: 17 U/L (ref 0–44)
AST: 22 U/L (ref 15–41)
Albumin: 4.1 g/dL (ref 3.5–5.0)
Alkaline Phosphatase: 126 U/L (ref 38–126)
Anion gap: 9 (ref 5–15)
BUN: 51 mg/dL — ABNORMAL HIGH (ref 8–23)
CO2: 26 mmol/L (ref 22–32)
Calcium: 9.4 mg/dL (ref 8.9–10.3)
Chloride: 104 mmol/L (ref 98–111)
Creatinine, Ser: 2.58 mg/dL — ABNORMAL HIGH (ref 0.61–1.24)
GFR, Estimated: 26 mL/min — ABNORMAL LOW (ref >60)
Glucose, Bld: 80 mg/dL (ref 70–99)
Potassium: 4.6 mmol/L (ref 3.5–5.1)
Sodium: 139 mmol/L (ref 135–145)
Total Bilirubin: 2.4 mg/dL — ABNORMAL HIGH (ref 0.3–1.2)
Total Protein: 7.1 g/dL (ref 6.5–8.1)

## 2020-10-18 LAB — ALBUMIN, PLEURAL OR PERITONEAL FLUID: Albumin, Fluid: 2.3 g/dL

## 2020-10-18 NOTE — Procedures (Signed)
PROCEDURE SUMMARY:  Successful US guided paracentesis from LLQ.  Yielded 4.1 Liters of amber colored fluid.  No immediate complications.  Pt tolerated well.   Specimen was sent for labs.  EBL < 58mL  Rockney Ghee 10/18/2020 2:47 PM

## 2020-10-19 LAB — AFP TUMOR MARKER: AFP, Serum, Tumor Marker: 1.9 ng/mL (ref 0.0–8.4)

## 2020-10-20 LAB — CYTOLOGY - NON PAP

## 2020-10-22 LAB — BODY FLUID CULTURE W GRAM STAIN
Culture: NO GROWTH
Gram Stain: NONE SEEN

## 2020-10-29 ENCOUNTER — Other Ambulatory Visit: Payer: Self-pay

## 2020-10-29 ENCOUNTER — Emergency Department
Admission: EM | Admit: 2020-10-29 | Discharge: 2020-10-29 | Disposition: A | Discharge status: Home / Self Care | Payer: Medicare Other | Attending: Emergency Medicine | Admitting: Emergency Medicine

## 2020-10-29 ENCOUNTER — Encounter: Payer: Self-pay | Admitting: Emergency Medicine

## 2020-10-29 DIAGNOSIS — I2510 Atherosclerotic heart disease of native coronary artery without angina pectoris: Secondary | ICD-10-CM | POA: Insufficient documentation

## 2020-10-29 DIAGNOSIS — N1832 Chronic kidney disease, stage 3b: Secondary | ICD-10-CM | POA: Insufficient documentation

## 2020-10-29 DIAGNOSIS — R531 Weakness: Secondary | ICD-10-CM | POA: Diagnosis not present

## 2020-10-29 DIAGNOSIS — N390 Urinary tract infection, site not specified: Secondary | ICD-10-CM | POA: Insufficient documentation

## 2020-10-29 DIAGNOSIS — Z7901 Long term (current) use of anticoagulants: Secondary | ICD-10-CM | POA: Insufficient documentation

## 2020-10-29 DIAGNOSIS — E1122 Type 2 diabetes mellitus with diabetic chronic kidney disease: Secondary | ICD-10-CM | POA: Insufficient documentation

## 2020-10-29 DIAGNOSIS — Z955 Presence of coronary angioplasty implant and graft: Secondary | ICD-10-CM | POA: Insufficient documentation

## 2020-10-29 DIAGNOSIS — I129 Hypertensive chronic kidney disease with stage 1 through stage 4 chronic kidney disease, or unspecified chronic kidney disease: Secondary | ICD-10-CM | POA: Diagnosis not present

## 2020-10-29 DIAGNOSIS — Z79899 Other long term (current) drug therapy: Secondary | ICD-10-CM | POA: Diagnosis not present

## 2020-10-29 DIAGNOSIS — Z7984 Long term (current) use of oral hypoglycemic drugs: Secondary | ICD-10-CM | POA: Insufficient documentation

## 2020-10-29 DIAGNOSIS — Z87891 Personal history of nicotine dependence: Secondary | ICD-10-CM | POA: Insufficient documentation

## 2020-10-29 DIAGNOSIS — Z85828 Personal history of other malignant neoplasm of skin: Secondary | ICD-10-CM | POA: Diagnosis not present

## 2020-10-29 LAB — CBC WITH DIFFERENTIAL/PLATELET
Abs Immature Granulocytes: 0.01 10*3/uL (ref 0.00–0.07)
Basophils Absolute: 0 10*3/uL (ref 0.0–0.1)
Basophils Relative: 0 %
Eosinophils Absolute: 0.2 10*3/uL (ref 0.0–0.5)
Eosinophils Relative: 6 %
HCT: 49.5 % (ref 39.0–52.0)
Hemoglobin: 15.9 g/dL (ref 13.0–17.0)
Immature Granulocytes: 0 %
Lymphocytes Relative: 6 %
Lymphs Abs: 0.2 10*3/uL — ABNORMAL LOW (ref 0.7–4.0)
MCH: 31 pg (ref 26.0–34.0)
MCHC: 32.1 g/dL (ref 30.0–36.0)
MCV: 96.5 fL (ref 80.0–100.0)
Monocytes Absolute: 0.3 10*3/uL (ref 0.1–1.0)
Monocytes Relative: 10 %
Neutro Abs: 2.6 10*3/uL (ref 1.7–7.7)
Neutrophils Relative %: 78 %
Platelets: 120 10*3/uL — ABNORMAL LOW (ref 150–400)
RBC: 5.13 MIL/uL (ref 4.22–5.81)
RDW: 16 % — ABNORMAL HIGH (ref 11.5–15.5)
WBC: 3.4 10*3/uL — ABNORMAL LOW (ref 4.0–10.5)
nRBC: 0 % (ref 0.0–0.2)

## 2020-10-29 LAB — COMPREHENSIVE METABOLIC PANEL
ALT: 33 U/L (ref 0–44)
AST: 27 U/L (ref 15–41)
Albumin: 4.3 g/dL (ref 3.5–5.0)
Alkaline Phosphatase: 148 U/L — ABNORMAL HIGH (ref 38–126)
Anion gap: 7 (ref 5–15)
BUN: 91 mg/dL — ABNORMAL HIGH (ref 8–23)
CO2: 22 mmol/L (ref 22–32)
Calcium: 9.1 mg/dL (ref 8.9–10.3)
Chloride: 109 mmol/L (ref 98–111)
Creatinine, Ser: 3.45 mg/dL — ABNORMAL HIGH (ref 0.61–1.24)
GFR, Estimated: 19 mL/min — ABNORMAL LOW (ref >60)
Glucose, Bld: 87 mg/dL (ref 70–99)
Potassium: 5.1 mmol/L (ref 3.5–5.1)
Sodium: 138 mmol/L (ref 135–145)
Total Bilirubin: 1.4 mg/dL — ABNORMAL HIGH (ref 0.3–1.2)
Total Protein: 7.8 g/dL (ref 6.5–8.1)

## 2020-10-29 LAB — URINALYSIS, COMPLETE (UACMP) WITH MICROSCOPIC
Bacteria, UA: NONE SEEN
Bilirubin Urine: NEGATIVE
Glucose, UA: NEGATIVE mg/dL
Ketones, ur: NEGATIVE mg/dL
Nitrite: NEGATIVE
Protein, ur: 30 mg/dL — AB
Specific Gravity, Urine: 1.015 (ref 1.005–1.030)
Squamous Epithelial / HPF: NONE SEEN (ref 0–5)
pH: 7 (ref 5.0–8.0)

## 2020-10-29 LAB — TROPONIN I (HIGH SENSITIVITY)
Troponin I (High Sensitivity): 25 ng/L — ABNORMAL HIGH (ref <18)
Troponin I (High Sensitivity): 24 ng/L — ABNORMAL HIGH (ref <18)

## 2020-10-29 LAB — MAGNESIUM: Magnesium: 2.6 mg/dL — ABNORMAL HIGH (ref 1.7–2.4)

## 2020-10-29 LAB — LIPASE, BLOOD: Lipase: 89 U/L — ABNORMAL HIGH (ref 11–51)

## 2020-10-29 MED ORDER — CEFDINIR 300 MG PO CAPS: 300.0000 mg | ORAL_CAPSULE | Freq: Two times a day (BID) | ORAL | 0 refills | Status: AC

## 2020-10-29 MED ORDER — CEFDINIR 300 MG PO CAPS
300.0000 mg | ORAL_CAPSULE | Freq: Once | ORAL | Status: AC
  Administered 2020-10-29: 300 mg via ORAL
  Filled 2020-10-29: qty 1

## 2020-10-29 NOTE — ED Provider Notes (Addendum)
[facility] Emergency Department Provider Note   ____________________________________________   Event Date/Time   First MD Initiated Contact with Patient 10/29/20 2217     (approximate)  I have reviewed the triage vital signs and the nursing notes.   HISTORY  Chief Complaint Nausea and Urinary Tract Infection    HPI Larry Mann is a 70 y.o. male who presents for generalized weakness in the setting of a positive urine dipstick at home  LOCATION: Generalized DURATION: 4 days prior to arrival TIMING: Worsening since onset SEVERITY: Mild QUALITY: Generalized weakness CONTEXT: Patient states that he has been on vacation for the past week and began feeling generally weak over the last 4 days.  Patient took an at home urine dipstick that showed positivity for UTI and presented the emergency department requesting antibiotics MODIFYING FACTORS: Denies ASSOCIATED SYMPTOMS: Denies   Per medical record review patient has history of urostomy after total bladder and prostate resection          Past Medical History:  Diagnosis Date   Anginal pain ([place])    Basal cell carcinoma 06/20/2009   R nose supratip   Bladder cancer ([place])    Cancer ([place])    Chicken pox    Chronic kidney disease    Coronary artery disease    Diabetes mellitus without complication ([place])    Headache    Hypertension    Myocardial infarction ([place])    Plantar fasciitis    Prostate cancer [facility])     Patient Active Problem List   Diagnosis Date Noted   Severe sepsis ([place]) 10/10/2019   Acute renal failure superimposed on stage 3b chronic kidney disease ([place]) 10/10/2019   Coronary artery disease    Cardiomyopathy ([place])    Elevated troponin    Diabetes mellitus ([place]) 11/14/2016   Diabetes mellitus type 2, uncomplicated ([place]) 30/09/6224   HTN (hypertension) 11/14/2016   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[place]) 10/17/2016   Benign essential hypertension 11/10/2014   Bladder  cancer ([place]) 10/18/2013    Past Surgical History:  Procedure Laterality Date   CARDIOVERSION N/A 04/23/2017   Procedure: CARDIOVERSION;  Surgeon: Corey Skains, MD;  Location: [facility] ORS;  Service: Cardiovascular;  Laterality: N/A;   CARDIOVERSION N/A 03/27/2018   Procedure: CARDIOVERSION (CATH LAB);  Surgeon: Corey Skains, MD;  Location: [facility] ORS;  Service: Cardiovascular;  Laterality: N/A;   COLONOSCOPY     COLONOSCOPY WITH PROPOFOL N/A 10/27/2014   Procedure: COLONOSCOPY WITH PROPOFOL;  Surgeon: Manya Silvas, MD;  Location: [facility] ENDOSCOPY;  Service: Endoscopy;  Laterality: N/A;   CORONARY ANGIOPLASTY     CYSTECTOMY     HERNIA REPAIR     LITHOTRIPSY     MOHS SURGERY     PROSTATE SURGERY      Prior to Admission medications   Medication Sig Start Date End Date Taking? Authorizing Provider  cefdinir (OMNICEF) 300 MG capsule Take 1 capsule (300 mg total) by mouth 2 (two) times daily for 5 days. 10/29/20 11/03/20 Yes Naaman Plummer, MD  atorvastatin (LIPITOR) 20 MG tablet Take 10 mg by mouth daily.     [provider]  carvedilol (COREG) 12.5 MG tablet Take 25 mg by mouth 2 (two) times daily with a meal.     [provider]  ELIQUIS 5 MG TABS tablet Take 5 mg by mouth 2 (two) times daily. 03/24/17   [provider]  ferrous sulfate 325 (65 FE) MG tablet Take 1 tablet by mouth  daily.    [provider]  furosemide (LASIX) 40 MG tablet Take 20 mg by mouth daily. Patient not taking: Reported on 04/19/2020 07/21/19   [provider]  glipiZIDE (GLUCOTROL XL) 5 MG 24 hr tablet Take 5 mg by mouth 2 (two) times daily.     [provider]  metFORMIN (GLUCOPHAGE) 1000 MG tablet Take 1,000 mg by mouth 2 (two) times daily with a meal. Patient not taking: Reported on 04/19/2020    [provider]  nitrofurantoin, macrocrystal-monohydrate, (MACROBID) 100 MG capsule Take 100 mg by mouth daily. 02/22/20   [provider]   ondansetron (ZOFRAN ODT) 4 MG disintegrating tablet Take 1 tablet (4 mg total) by mouth every 8 (eight) hours as needed for nausea or vomiting. 07/11/20   Naaman Plummer, MD  potassium chloride (KLOR-CON) 10 MEQ tablet Take 10 mEq by mouth 2 (two) times daily. 02/22/20   [provider]  sacubitril-valsartan (ENTRESTO) 49-51 MG Take 1 tablet by mouth 2 (two) times daily.    [provider]  torsemide (DEMADEX) 20 MG tablet Take 20 mg by mouth 2 (two) times daily. 02/12/20   [provider]  vitamin B-12 (CYANOCOBALAMIN) 1000 MCG tablet Take 1,000 mcg by mouth daily. 09/02/19   [provider]    Allergies Patient has no known allergies.  Family History  Problem Relation Age of Onset   Multiple myeloma Mother     Social History Social History   Tobacco Use   Smoking status: Former    Packs/day: 1.50    Years: 25.00    Pack years: 37.50    Types: Cigarettes    Quit date: 05/19/2001    Years since quitting: 19.4   Smokeless tobacco: Never  Vaping Use   Vaping Use: Never used  Substance Use Topics   Alcohol use: Yes    Comment: occasional   Drug use: No    Review of Systems Constitutional: No fever/chills.  Endorses generalized weakness Eyes: No visual changes. ENT: No sore throat. Cardiovascular: Denies chest pain. Respiratory: Denies shortness of breath. Gastrointestinal: No abdominal pain.  No nausea, no vomiting.  No diarrhea. Genitourinary: Negative for dysuria. Musculoskeletal: Negative for acute arthralgias Skin: Negative for rash. Neurological: Negative for headaches, weakness/numbness/paresthesias in any extremity Psychiatric: Negative for suicidal ideation/homicidal ideation   ____________________________________________   PHYSICAL EXAM:  VITAL SIGNS: ED Triage Vitals  Enc Vitals Group     BP 10/29/20 1929 (!) 102/59     Pulse Rate 10/29/20 1929 63     Resp 10/29/20 1929 20     Temp 10/29/20 1929 97.7 F (36.5 C)      Temp Source 10/29/20 1929 Oral     SpO2 10/29/20 1929 100 %     Weight 10/29/20 1928 135 lb (61.2 kg)     Height 10/29/20 1928 _0  (1.676 m)     Head Circumference --      Peak Flow --      Pain Score 10/29/20 1926 4     Pain Loc --      Pain Edu? --      Excl. in [place]? --    Constitutional: Alert and oriented. Well appearing and in no acute distress. Eyes: Conjunctivae are normal. PERRL. Head: Atraumatic. Nose: No congestion/rhinnorhea. Mouth/Throat: Mucous membranes are moist. Neck: No stridor Cardiovascular: Grossly normal heart sounds.  Good peripheral circulation. Respiratory: Normal respiratory effort.  No retractions. Gastrointestinal: Urostomy tube in place.  Soft and nontender. No distention.  Musculoskeletal: No obvious deformities Neurologic:  Normal speech and language. No gross focal neurologic deficits are appreciated. Skin:  Skin is warm and dry. No rash noted. Psychiatric: Mood and affect are normal. Speech and behavior are normal.  ____________________________________________   LABS (all labs ordered are listed, but only abnormal results are displayed)  Labs Reviewed  CBC WITH DIFFERENTIAL/PLATELET - Abnormal; Notable for the following components:      Result Value   WBC 3.4 (*)    RDW 16.0 (*)    Platelets 120 (*)    Lymphs Abs 0.2 (*)    All other components within normal limits  COMPREHENSIVE METABOLIC PANEL - Abnormal; Notable for the following components:   BUN 91 (*)    Creatinine, Ser 3.45 (*)    Alkaline Phosphatase 148 (*)    Total Bilirubin 1.4 (*)    GFR, Estimated 19 (*)    All other components within normal limits  LIPASE, BLOOD - Abnormal; Notable for the following components:   Lipase 89 (*)    All other components within normal limits  MAGNESIUM - Abnormal; Notable for the following components:   Magnesium 2.6 (*)    All other components within normal limits  URINALYSIS, COMPLETE (UACMP) WITH MICROSCOPIC - Abnormal; Notable for the  following components:   Color, Urine YELLOW (*)    APPearance HAZY (*)    Hgb urine dipstick LARGE (*)    Protein, ur 30 (*)    Leukocytes,Ua SMALL (*)    All other components within normal limits  TROPONIN I (HIGH SENSITIVITY) - Abnormal; Notable for the following components:   Troponin I (High Sensitivity) 25 (*)    All other components within normal limits  TROPONIN I (HIGH SENSITIVITY) - Abnormal; Notable for the following components:   Troponin I (High Sensitivity) 24 (*)    All other components within normal limits  URINE CULTURE   ____________________________________________  EKG  ED ECG REPORT I, Naaman Plummer, the attending physician, personally viewed and interpreted this ECG.  Date: 10/29/2020 EKG Time: 1934 Rate: 67 Rhythm: normal sinus rhythm QRS Axis: normal Intervals: normal ST/T Wave abnormalities: normal Narrative Interpretation: no evidence of acute ischemia  PROCEDURES  Procedure(s) performed (including Critical Care):  .1-3 Lead EKG Interpretation  Date/Time: 10/29/2020 10:50 PM Performed by: Naaman Plummer, MD Authorized by: Naaman Plummer, MD     Interpretation: normal     ECG rate:  69   ECG rate assessment: normal     Rhythm: sinus rhythm     Ectopy: none     Conduction: normal     ____________________________________________   INITIAL IMPRESSION / ASSESSMENT AND PLAN / ED COURSE  As part of my medical decision making, I reviewed the following data within the electronic medical record, if available:  Nursing notes reviewed and incorporated, Labs reviewed, EKG interpreted, Old chart reviewed, Radiograph reviewed and Notes from prior ED visits reviewed and incorporated        No e/o epididymo-orchitis on exam and low suspicion for rectal abscess, prostatitis, other GU deep space infection, gonorrhea/chlamydia. Unlikely Infected Urolithiasis, AAA, cholecystitis, pancreatitis, SBO, appendicitis, or other acute abdomen. Workup: UA:  None Rx: Cefdinir 300 mg twice daily x5 days  Disposition: Discharge home. SRP discussed. Advise follow up with primary care provider within 24-72 hours.      ____________________________________________   FINAL CLINICAL IMPRESSION(S) / ED DIAGNOSES  Final diagnoses:  Lower urinary tract infectious disease     ED Discharge Orders  Ordered    cefdinir (OMNICEF) 300 MG capsule  2 times daily        10/29/20 2239             Note:  This document was prepared using Dragon voice recognition software and may include unintentional dictation errors.    Naaman Plummer, MD 10/29/20 2250    Naaman Plummer, MD 10/29/20 2250

## 2020-10-29 NOTE — ED Triage Notes (Signed)
Pt to ED via POV, states has been feeling "puny" for the last few days. Pt states has a urostomy in place, took a home UTI test and it came back positive. 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 Pt also c/o nausea and diarrhea.

## 2020-10-31 LAB — URINE CULTURE

## 2020-11-01 ENCOUNTER — Other Ambulatory Visit (HOSPITAL_COMMUNITY): Payer: Self-pay | Admitting: Gastroenterology

## 2020-11-01 ENCOUNTER — Other Ambulatory Visit: Payer: Self-pay | Admitting: Gastroenterology

## 2020-11-01 DIAGNOSIS — R188 Other ascites: Secondary | ICD-10-CM

## 2020-11-05 ENCOUNTER — Other Ambulatory Visit: Payer: Self-pay

## 2020-11-05 ENCOUNTER — Emergency Department: Payer: Medicare Other

## 2020-11-05 ENCOUNTER — Emergency Department
Admission: EM | Admit: 2020-11-05 | Discharge: 2020-11-05 | Disposition: A | Discharge status: Other Acute Inpatient Hospital | Payer: Medicare Other | Attending: Emergency Medicine | Admitting: Emergency Medicine

## 2020-11-05 DIAGNOSIS — Z79899 Other long term (current) drug therapy: Secondary | ICD-10-CM | POA: Diagnosis not present

## 2020-11-05 DIAGNOSIS — Z8551 Personal history of malignant neoplasm of bladder: Secondary | ICD-10-CM | POA: Insufficient documentation

## 2020-11-05 DIAGNOSIS — K56609 Unspecified intestinal obstruction, unspecified as to partial versus complete obstruction: Secondary | ICD-10-CM | POA: Diagnosis not present

## 2020-11-05 DIAGNOSIS — Z87891 Personal history of nicotine dependence: Secondary | ICD-10-CM | POA: Diagnosis not present

## 2020-11-05 DIAGNOSIS — Z7984 Long term (current) use of oral hypoglycemic drugs: Secondary | ICD-10-CM | POA: Diagnosis not present

## 2020-11-05 DIAGNOSIS — N179 Acute kidney failure, unspecified: Secondary | ICD-10-CM

## 2020-11-05 DIAGNOSIS — R112 Nausea with vomiting, unspecified: Secondary | ICD-10-CM | POA: Diagnosis present

## 2020-11-05 DIAGNOSIS — Z20822 Contact with and (suspected) exposure to covid-19: Secondary | ICD-10-CM | POA: Diagnosis not present

## 2020-11-05 DIAGNOSIS — Z8546 Personal history of malignant neoplasm of prostate: Secondary | ICD-10-CM | POA: Diagnosis not present

## 2020-11-05 DIAGNOSIS — I129 Hypertensive chronic kidney disease with stage 1 through stage 4 chronic kidney disease, or unspecified chronic kidney disease: Secondary | ICD-10-CM | POA: Diagnosis not present

## 2020-11-05 DIAGNOSIS — Z85828 Personal history of other malignant neoplasm of skin: Secondary | ICD-10-CM | POA: Diagnosis not present

## 2020-11-05 DIAGNOSIS — Z7901 Long term (current) use of anticoagulants: Secondary | ICD-10-CM | POA: Diagnosis not present

## 2020-11-05 DIAGNOSIS — I2510 Atherosclerotic heart disease of native coronary artery without angina pectoris: Secondary | ICD-10-CM | POA: Diagnosis not present

## 2020-11-05 DIAGNOSIS — E1122 Type 2 diabetes mellitus with diabetic chronic kidney disease: Secondary | ICD-10-CM | POA: Insufficient documentation

## 2020-11-05 DIAGNOSIS — N1832 Chronic kidney disease, stage 3b: Secondary | ICD-10-CM | POA: Insufficient documentation

## 2020-11-05 LAB — COMPREHENSIVE METABOLIC PANEL
ALT: 31 U/L (ref 0–44)
AST: 24 U/L (ref 15–41)
Albumin: 4.3 g/dL (ref 3.5–5.0)
Alkaline Phosphatase: 169 U/L — ABNORMAL HIGH (ref 38–126)
Anion gap: 14 (ref 5–15)
BUN: 116 mg/dL — ABNORMAL HIGH (ref 8–23)
CO2: 13 mmol/L — ABNORMAL LOW (ref 22–32)
Calcium: 9.7 mg/dL (ref 8.9–10.3)
Chloride: 109 mmol/L (ref 98–111)
Creatinine, Ser: 4.09 mg/dL — ABNORMAL HIGH (ref 0.61–1.24)
GFR, Estimated: 15 mL/min — ABNORMAL LOW (ref >60)
Glucose, Bld: 275 mg/dL — ABNORMAL HIGH (ref 70–99)
Potassium: 6.8 mmol/L (ref 3.5–5.1)
Sodium: 136 mmol/L (ref 135–145)
Total Bilirubin: 1.7 mg/dL — ABNORMAL HIGH (ref 0.3–1.2)
Total Protein: 8.1 g/dL (ref 6.5–8.1)

## 2020-11-05 LAB — URINALYSIS, COMPLETE (UACMP) WITH MICROSCOPIC
Bilirubin Urine: NEGATIVE
Glucose, UA: 150 mg/dL — AB
Ketones, ur: NEGATIVE mg/dL
Leukocytes,Ua: NEGATIVE
Nitrite: POSITIVE — AB
Protein, ur: 30 mg/dL — AB
Specific Gravity, Urine: 1.011 (ref 1.005–1.030)
Squamous Epithelial / HPF: NONE SEEN (ref 0–5)
pH: 6 (ref 5.0–8.0)

## 2020-11-05 LAB — BASIC METABOLIC PANEL
Anion gap: 7 (ref 5–15)
BUN: 113 mg/dL — ABNORMAL HIGH (ref 8–23)
CO2: 14 mmol/L — ABNORMAL LOW (ref 22–32)
Calcium: 9.4 mg/dL (ref 8.9–10.3)
Chloride: 116 mmol/L — ABNORMAL HIGH (ref 98–111)
Creatinine, Ser: 3.6 mg/dL — ABNORMAL HIGH (ref 0.61–1.24)
GFR, Estimated: 18 mL/min — ABNORMAL LOW (ref >60)
Glucose, Bld: 131 mg/dL — ABNORMAL HIGH (ref 70–99)
Potassium: 5.8 mmol/L — ABNORMAL HIGH (ref 3.5–5.1)
Sodium: 137 mmol/L (ref 135–145)

## 2020-11-05 LAB — PROTIME-INR
INR: 1.6 — ABNORMAL HIGH (ref 0.8–1.2)
Prothrombin Time: 18.6 seconds — ABNORMAL HIGH (ref 11.4–15.2)

## 2020-11-05 LAB — CBC
HCT: 48.3 % (ref 39.0–52.0)
Hemoglobin: 16.1 g/dL (ref 13.0–17.0)
MCH: 31.2 pg (ref 26.0–34.0)
MCHC: 33.3 g/dL (ref 30.0–36.0)
MCV: 93.6 fL (ref 80.0–100.0)
Platelets: 120 10*3/uL — ABNORMAL LOW (ref 150–400)
RBC: 5.16 MIL/uL (ref 4.22–5.81)
RDW: 15.3 % (ref 11.5–15.5)
WBC: 7.1 10*3/uL (ref 4.0–10.5)
nRBC: 0 % (ref 0.0–0.2)

## 2020-11-05 LAB — RESP PANEL BY RT-PCR (FLU A&B, COVID) ARPGX2
Influenza A by PCR: NEGATIVE
Influenza B by PCR: NEGATIVE
SARS Coronavirus 2 by RT PCR: NEGATIVE

## 2020-11-05 LAB — LACTIC ACID, PLASMA
Lactic Acid, Venous: 1 mmol/L (ref 0.5–1.9)
Lactic Acid, Venous: 1 mmol/L (ref 0.5–1.9)

## 2020-11-05 LAB — LIPASE, BLOOD: Lipase: 69 U/L — ABNORMAL HIGH (ref 11–51)

## 2020-11-05 LAB — APTT: aPTT: 36 seconds (ref 24–36)

## 2020-11-05 MED ORDER — SODIUM ZIRCONIUM CYCLOSILICATE 10 G PO PACK
10.0000 g | PACK | Freq: Once | ORAL | Status: AC
  Administered 2020-11-05: 10 g via ORAL
  Filled 2020-11-05: qty 1

## 2020-11-05 MED ORDER — SODIUM CHLORIDE 0.9 % IV BOLUS (SEPSIS)
1000.0000 mL | Freq: Once | INTRAVENOUS | Status: AC
  Administered 2020-11-05: 1000 mL via INTRAVENOUS

## 2020-11-05 MED ORDER — SODIUM CHLORIDE 0.9 % IV BOLUS
1000.0000 mL | Freq: Once | INTRAVENOUS | Status: AC
  Administered 2020-11-05: 1000 mL via INTRAVENOUS

## 2020-11-05 MED ORDER — DEXTROSE 50 % IV SOLN
1.0000 | Freq: Once | INTRAVENOUS | Status: AC
  Administered 2020-11-05: 50 mL via INTRAVENOUS
  Filled 2020-11-05: qty 50

## 2020-11-05 MED ORDER — SODIUM CHLORIDE 0.45 % IV SOLN
INTRAVENOUS | Status: DC
  Filled 2020-11-05 (×2): qty 75

## 2020-11-05 MED ORDER — CALCIUM GLUCONATE-NACL 1-0.675 GM/50ML-% IV SOLN
1.0000 g | Freq: Once | INTRAVENOUS | Status: AC
  Administered 2020-11-05: 1000 mg via INTRAVENOUS
  Filled 2020-11-05: qty 50

## 2020-11-05 MED ORDER — INSULIN ASPART 100 UNIT/ML IJ SOLN
10.0000 [IU] | Freq: Once | INTRAMUSCULAR | Status: AC
  Administered 2020-11-05: 10 [IU] via INTRAVENOUS
  Filled 2020-11-05: qty 1

## 2020-11-05 MED ORDER — HYDROMORPHONE HCL 1 MG/ML IJ SOLN
0.5000 mg | Freq: Once | INTRAMUSCULAR | Status: AC
  Administered 2020-11-05: 0.5 mg via INTRAVENOUS
  Filled 2020-11-05: qty 1

## 2020-11-05 MED ORDER — ONDANSETRON HCL 4 MG/2ML IJ SOLN
4.0000 mg | Freq: Once | INTRAMUSCULAR | Status: AC
  Administered 2020-11-05: 4 mg via INTRAVENOUS
  Filled 2020-11-05: qty 2

## 2020-11-05 NOTE — ED Notes (Signed)
Refuses temp because of gagging

## 2020-11-05 NOTE — ED Notes (Signed)
Wife updated on pt transfer status and that the transport team is on the way

## 2020-11-05 NOTE — ED Notes (Signed)
Lab at bedside obtaining BCx2  Wife at bedside and updated on pt status

## 2020-11-05 NOTE — ED Notes (Signed)
Report and lab information discussed with Paramedic, all questions answered. Pt stable on transfer. Wife at bedside at time of transfer. NAD noted. VSS.

## 2020-11-05 NOTE — ED Notes (Signed)
Report to [place] from [facility] ER and also Joey from [employer]. We did request help with transport and Joey states he will call back when they have found someone to assist with transport.

## 2020-11-05 NOTE — ED Notes (Signed)
Pt presents today with c/o of ABD cramping, diarrhea and N/V since yesterday. Pt states recent UTI and took ABX and finished full course. Pt states he is "going a lot", pt states consistency is similar to some formed and some watery stool.   Pt has a ileostomy in place that is draining with no issues. Pt denies fevers or chills. Pt is A&Ox4. NAD noted.

## 2020-11-05 NOTE — ED Notes (Signed)
Larry Mann (wife) [phone]

## 2020-11-05 NOTE — ED Provider Notes (Signed)
[facility] Emergency Department Provider Note   ____________________________________________   Event Date/Time   First MD Initiated Contact with Patient 11/05/20 [phone]     (approximate)  I have reviewed the triage vital signs and the nursing notes.   HISTORY  Chief Complaint Emesis and Diarrhea    HPI Larry Mann is a 70 y.o. male with below stated past medical history who presents for vomiting and diarrhea  LOCATION: Abdomen DURATION: 1 day TIMING: Stable since onset SEVERITY: Moderate QUALITY: Vomiting/diarrhea CONTEXT: Patient states that he was recently diagnosed 1 week ago with urinary tract infection and started on cefdinir.  Patient states that after the antibiotics ended 2 days ago he began feeling nauseous and started having some emesis followed by diarrhea that began today. MODIFYING FACTORS: Denies any exacerbating or relieving factors. ASSOCIATED SYMPTOMS: Denies   Per medical record review, patient has history of urostomy and recurrent urinary tract infections          Past Medical History:  Diagnosis Date   Anginal pain ([place])    Basal cell carcinoma 06/20/2009   R nose supratip   Bladder cancer (HCC)    Cancer (HCC)    Chicken pox    Chronic kidney disease    Coronary artery disease    Diabetes mellitus without complication ([place])    Headache    Hypertension    Myocardial infarction ([place])    Plantar fasciitis    Prostate cancer [facility])     Patient Active Problem List   Diagnosis Date Noted   Severe sepsis ([place]) 10/10/2019   Acute renal failure superimposed on stage 3b chronic kidney disease ([place]) 10/10/2019   Coronary artery disease    Cardiomyopathy ([place])    Elevated troponin    Diabetes mellitus ([place]) 11/14/2016   Diabetes mellitus type 2, uncomplicated ([place]) 02/63/7858   HTN (hypertension) 11/14/2016   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[place]) 10/17/2016   Benign essential hypertension 11/10/2014   Bladder  cancer ([place]) 10/18/2013    Past Surgical History:  Procedure Laterality Date   CARDIOVERSION N/A 04/23/2017   Procedure: CARDIOVERSION;  Surgeon: Corey Skains, MD;  Location: [facility] ORS;  Service: Cardiovascular;  Laterality: N/A;   CARDIOVERSION N/A 03/27/2018   Procedure: CARDIOVERSION (CATH LAB);  Surgeon: Corey Skains, MD;  Location: [facility] ORS;  Service: Cardiovascular;  Laterality: N/A;   COLONOSCOPY     COLONOSCOPY WITH PROPOFOL N/A 10/27/2014   Procedure: COLONOSCOPY WITH PROPOFOL;  Surgeon: Manya Silvas, MD;  Location: [facility] ENDOSCOPY;  Service: Endoscopy;  Laterality: N/A;   CORONARY ANGIOPLASTY     CYSTECTOMY     HERNIA REPAIR     LITHOTRIPSY     MOHS SURGERY     PROSTATE SURGERY      Prior to Admission medications   Medication Sig Start Date End Date Taking? Authorizing Provider  atorvastatin (LIPITOR) 20 MG tablet Take 10 mg by mouth daily.     [provider]  carvedilol (COREG) 25 MG tablet Take 37.5 mg by mouth 2 (two) times daily. 08/26/20   [provider]  diazepam (VALIUM) 5 MG tablet Take 5-10 mg by mouth daily at 6 (six) AM. 10/31/20   [provider]  ELIQUIS 5 MG TABS tablet Take 5 mg by mouth 2 (two) times daily. 03/24/17   [provider]  FARXIGA 10 MG TABS tablet Take 10 mg by mouth daily. 09/27/20   [provider]  ferrous sulfate 325 (65 FE) MG tablet  Take 1 tablet by mouth daily.    [provider]  furosemide (LASIX) 40 MG tablet Take 20 mg by mouth daily. Patient not taking: Reported on 04/19/2020 07/21/19   [provider]  glipiZIDE (GLUCOTROL XL) 5 MG 24 hr tablet Take 5 mg by mouth 2 (two) times daily.     [provider]  metFORMIN (GLUCOPHAGE) 1000 MG tablet Take 1,000 mg by mouth 2 (two) times daily with a meal. Patient not taking: Reported on 04/19/2020    [provider]  nitrofurantoin, macrocrystal-monohydrate, (MACROBID) 100 MG capsule Take 100 mg by mouth  daily. 02/22/20   [provider]  ondansetron (ZOFRAN ODT) 4 MG disintegrating tablet Take 1 tablet (4 mg total) by mouth every 8 (eight) hours as needed for nausea or vomiting. 07/11/20   Naaman Plummer, MD  pantoprazole (PROTONIX) 20 MG tablet Take 20 mg by mouth daily. 10/27/20   [provider]  potassium chloride 20 MEQ/15ML (10%) SOLN Take 20 mEq by mouth daily. 09/21/20   [provider]  sacubitril-valsartan (ENTRESTO) 49-51 MG Take 1 tablet by mouth 2 (two) times daily.    [provider]  spironolactone (ALDACTONE) 100 MG tablet Take 100 mg by mouth daily. 10/06/20   [provider]  torsemide (DEMADEX) 20 MG tablet Take 20 mg by mouth 2 (two) times daily. 02/12/20   [provider]  vitamin B-12 (CYANOCOBALAMIN) 1000 MCG tablet Take 1,000 mcg by mouth daily. 09/02/19   [provider]    Allergies Patient has no known allergies.  Family History  Problem Relation Age of Onset   Multiple myeloma Mother     Social History Social History   Tobacco Use   Smoking status: Former    Packs/day: 1.50    Years: 25.00    Pack years: 37.50    Types: Cigarettes    Quit date: 05/19/2001    Years since quitting: 19.4   Smokeless tobacco: Never  Vaping Use   Vaping Use: Never used  Substance Use Topics   Alcohol use: Yes    Comment: occasional   Drug use: No    Review of Systems Constitutional: No fever/chills Eyes: No visual changes. ENT: No sore throat. Cardiovascular: Denies chest pain. Respiratory: Denies shortness of breath. Gastrointestinal: No abdominal pain.  Endorses vomiting and diarrhea Genitourinary: Negative for dysuria. Musculoskeletal: Negative for acute arthralgias Skin: Negative for rash. Neurological: Negative for headaches, weakness/numbness/paresthesias in any extremity Psychiatric: Negative for suicidal ideation/homicidal ideation   ____________________________________________   PHYSICAL  EXAM:  VITAL SIGNS: ED Triage Vitals  Enc Vitals Group     BP 11/05/20 0739 (!) 152/96     Pulse Rate 11/05/20 0739 88     Resp 11/05/20 0739 18     Temp --      Temp src --      SpO2 11/05/20 0739 98 %     Weight 11/05/20 0740 130 lb (59 kg)     Height 11/05/20 0740 5' 6"  (1.676 m)     Head Circumference --      Peak Flow --      Pain Score 11/05/20 0740 7     Pain Loc --      Pain Edu? --      Excl. in [place]? --    Constitutional: Alert and oriented. Well appearing and in no acute distress. Eyes: Conjunctivae are normal. PERRL. Head: Atraumatic. Nose: No congestion/rhinnorhea. Mouth/Throat: Mucous membranes are moist. Neck: No stridor Cardiovascular:  Grossly normal heart sounds.  Good peripheral circulation. Respiratory: Normal respiratory effort.  No retractions. Gastrointestinal: Urostomy stoma in place and outputting clear urine.  Soft and nontender. No distention. Musculoskeletal: No obvious deformities Neurologic:  Normal speech and language. No gross focal neurologic deficits are appreciated. Skin:  Skin is warm and dry. No rash noted. Psychiatric: Mood and affect are normal. Speech and behavior are normal.  ____________________________________________   LABS (all labs ordered are listed, but only abnormal results are displayed)  Labs Reviewed  LIPASE, BLOOD - Abnormal; Notable for the following components:      Result Value   Lipase 69 (*)    All other components within normal limits  COMPREHENSIVE METABOLIC PANEL - Abnormal; Notable for the following components:   Potassium 6.8 (*)    CO2 13 (*)    Glucose, Bld 275 (*)    BUN 116 (*)    Creatinine, Ser 4.09 (*)    Alkaline Phosphatase 169 (*)    Total Bilirubin 1.7 (*)    GFR, Estimated 15 (*)    All other components within normal limits  CBC - Abnormal; Notable for the following components:   Platelets 120 (*)    All other components within normal limits  URINALYSIS, COMPLETE (UACMP) WITH MICROSCOPIC -  Abnormal; Notable for the following components:   Color, Urine YELLOW (*)    APPearance HAZY (*)    Glucose, UA 150 (*)    Hgb urine dipstick LARGE (*)    Protein, ur 30 (*)    Nitrite POSITIVE (*)    Bacteria, UA FEW (*)    All other components within normal limits  PROTIME-INR - Abnormal; Notable for the following components:   Prothrombin Time 18.6 (*)    INR 1.6 (*)    All other components within normal limits  BASIC METABOLIC PANEL - Abnormal; Notable for the following components:   Potassium 5.8 (*)    Chloride 116 (*)    CO2 14 (*)    Glucose, Bld 131 (*)    BUN 113 (*)    Creatinine, Ser 3.60 (*)    GFR, Estimated 18 (*)    All other components within normal limits  RESP PANEL BY RT-PCR (FLU A&B, COVID) ARPGX2  CULTURE, BLOOD (SINGLE)  URINE CULTURE  LACTIC ACID, PLASMA  LACTIC ACID, PLASMA  APTT   ____________________________________________  EKG  ED ECG REPORT I, Naaman Plummer, the attending physician, personally viewed and interpreted this ECG.  Date: 11/05/2020 EKG Time: 0937 Rate: 92 Rhythm: normal sinus rhythm QRS Axis: normal Intervals: LBBB ST/T Wave abnormalities: normal Narrative Interpretation: LBBB.  No evidence of acute ischemia  ____________________________________________  RADIOLOGY  ED MD interpretation: CT of the abdomen and pelvis without contrast shows a closed-loop obstruction through a sigmoid mesocolon hernia  Official radiology report(s): CT Abdomen Pelvis Wo Contrast  Result Date: 11/05/2020 CLINICAL DATA:  Nausea and vomiting.  Liver cirrhosis. EXAM: CT ABDOMEN AND PELVIS WITHOUT CONTRAST TECHNIQUE: Multidetector CT imaging of the abdomen and pelvis was performed following the standard protocol without IV contrast. COMPARISON:  10/09/2019 FINDINGS: Lower chest: No acute abnormality. Hepatobiliary: No focal liver abnormality identified. Stone scratch set small stones are noted layering within the gallbladder which measure up to 3  mm. No signs of gallbladder wall inflammation or bile duct dilatation. Pancreas: Unremarkable. No pancreatic ductal dilatation or surrounding inflammatory changes. Spleen: Normal in size without focal abnormality. Adrenals/Urinary Tract: Normal adrenal glands. Inferior pole left renal calculus measures 7 mm,  image 28/2. Exophytic cyst is noted arising off the lateral cortex of the right kidney measuring 4.3 cm. No right renal calculi. No hydronephrosis identified bilaterally. Similar appearance of left hydroureter to the level of the left lower quadrant ileal conduit. Within the right lower quadrant scratch set diverting urostomy is noted within the right lower quadrant of the abdomen. There is parastomal herniation of fat with fluid and soft tissue haziness throughout the herniated fat. The fluid appears similar to the previous exam and likely secondary to ascites. The soft tissue stranding however is a new finding. Status post cystectomy. Stomach/Bowel: Stomach is unremarkable. Proximal small bowel loops appear normal. The distal small bowel loops also have a normal caliber. Signs of sigmoid mesocolon hernia identified which contains a loop of small bowel. The herniated small bowel loop appears dilated with surrounding soft tissue stranding with congestion in the small bowel mesentery. Findings concerning for closed loop obstruction, image 60/2. There is an abrupt transition of the entering and exiting small bowel loops, sagittal image 59/6. No signs of pneumatosis. No portal venous gas identified. Vascular/Lymphatic: Aortic atherosclerosis. There is a 1.2 cm left external iliac lymph node which appears unchanged from the previous exam, image 56/2. No additional enlarged abdominopelvic lymph nodes. Reproductive: Prostate gland is not well visualized. Other: Decreased volume of ascites with a small volume of fluid overlying the liver. Musculoskeletal: No acute or significant osseous findings. IMPRESSION: 1. 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 The herniated small bowel loop appears dilated with surrounding soft tissue stranding with venous congestion in the small bowel mesentery. Findings concerning for closed loop obstruction secondary to internal hernia. No signs of pneumatosis, portal venous gas or free air identified. 2. Right lower quadrant diverting urostomy is identified with fat containing parastomal hernia. There is new, diffuse soft tissue stranding within the herniated fat. Correlate for any clinical signs or symptoms of incarceration. 3. Status post cystectomy with left lower quadrant ileal conduit. Similar appearance of left hydroureter to the level of the left lower quadrant ileal conduit. 4. Decrease in volume of ascites with a small volume of residual ascites overlying the liver. 5. Gallstones. 6. Nonobstructing left renal calculus. 7. Aortic atherosclerosis. Aortic Atherosclerosis (ICD10-I70.0). Electronically Signed   By: Kerby Moors M.D.   On: 11/05/2020 11:01   DG Chest Port 1 View  Result Date: 11/05/2020 CLINICAL DATA:  Questionable sepsis. EXAM: PORTABLE CHEST 1 VIEW COMPARISON:  10/09/2019 . FINDINGS: Left chest wall ICD noted with lead in the right ventricle. Heart size and mediastinal contours appear stable. No pleural effusion or edema. No airspace densities. Visualized osseous structures appear intact. IMPRESSION: No acute cardiopulmonary abnormalities. Electronically Signed   By: Kerby Moors M.D.   On: 11/05/2020 09:54    ____________________________________________   PROCEDURES  Procedure(s) performed (including Critical Care):  .1-3 Lead EKG Interpretation  Date/Time: 11/05/2020 1:36 PM Performed by: Naaman Plummer, MD Authorized by: Naaman Plummer, MD     Interpretation: normal     ECG rate:  76   ECG rate assessment: normal     Rhythm: sinus rhythm     Ectopy: none     Conduction: normal   .Critical Care  Date/Time:  11/05/2020 1:36 PM Performed by: Naaman Plummer, MD Authorized by: Naaman Plummer, MD   Critical care provider statement:    Critical care time (minutes):  41   Critical care time was exclusive of:  Separately billable procedures and treating  other patients   Critical care was necessary to treat or prevent imminent or life-threatening deterioration of the following conditions:  Metabolic crisis   Critical care was time spent personally by me on the following activities:  Discussions with consultants, evaluation of patient's response to treatment, examination of patient, ordering and performing treatments and interventions, ordering and review of laboratory studies, ordering and review of radiographic studies, pulse oximetry, re-evaluation of patient's condition, obtaining history from patient or surrogate and review of old charts   I assumed direction of critical care for this patient from another provider in my specialty: no     Care discussed with: admitting provider     ____________________________________________   INITIAL IMPRESSION / ASSESSMENT AND PLAN / ED COURSE  As part of my medical decision making, I reviewed the following data within the electronic medical record, if available:  Nursing notes reviewed and incorporated, Labs reviewed, EKG interpreted, Old chart reviewed, Radiograph reviewed and Notes from prior ED visits reviewed and incorporated        Given History, Exam I believe patient needs labs and imaging to evaluate for SBO vs other acute abdomen. ED Workup: CBC, BMP, LFTs, CT Abdomen/Pelvis ED Findings: CT: Small Bowel Obstruction  History, Exam, and Workup show no overt evidence of mesenteric ischemia, bowel gangrene, abscess, peritonitis. ED Interventions: Analgesia. Defer ABX at this time. Consult: General Surgery at [facility] Disposition: Admit      ____________________________________________   FINAL CLINICAL IMPRESSION(S) / ED DIAGNOSES  Final  diagnoses:  Nausea, vomiting, and diarrhea  Small bowel obstruction ([place])  Acute renal failure superimposed on chronic kidney disease, unspecified CKD stage, unspecified acute renal failure type [facility])     ED Discharge Orders     None        Note:  This document was prepared using Dragon voice recognition software and may include unintentional dictation errors.    Naaman Plummer, MD 11/05/20 1336

## 2020-11-05 NOTE — ED Triage Notes (Addendum)
Pt comes pov with diarrhea and vomiting for a day. Denies new foods. Has been taking a new antibiotic for his UTI. States he has tried zofran ODT with no relief.

## 2020-11-06 LAB — URINE CULTURE: Culture: >=100000 — AB

## 2020-11-09 ENCOUNTER — Ambulatory Visit: Payer: PRIVATE HEALTH INSURANCE

## 2020-11-10 LAB — CULTURE, BLOOD (SINGLE)
Culture: NO GROWTH
Special Requests: ADEQUATE

## 2021-04-17 ENCOUNTER — Other Ambulatory Visit (HOSPITAL_COMMUNITY): Payer: Self-pay | Admitting: Gastroenterology

## 2021-04-17 ENCOUNTER — Other Ambulatory Visit: Payer: Self-pay | Admitting: Gastroenterology

## 2021-04-17 DIAGNOSIS — R188 Other ascites: Secondary | ICD-10-CM

## 2021-04-17 DIAGNOSIS — K7460 Unspecified cirrhosis of liver: Secondary | ICD-10-CM

## 2021-04-25 ENCOUNTER — Ambulatory Visit
Admission: RE | Admit: 2021-04-25 | Discharge: 2021-04-25 | Disposition: A | Discharge status: Home / Self Care | Payer: Medicare Other | Source: Ambulatory Visit | Attending: Gastroenterology | Admitting: Gastroenterology

## 2021-04-25 DIAGNOSIS — K7460 Unspecified cirrhosis of liver: Secondary | ICD-10-CM | POA: Insufficient documentation

## 2021-04-25 DIAGNOSIS — R188 Other ascites: Secondary | ICD-10-CM | POA: Insufficient documentation

## 2021-08-16 ENCOUNTER — Ambulatory Visit: Payer: PRIVATE HEALTH INSURANCE | Admitting: Dermatology

## 2021-10-08 ENCOUNTER — Ambulatory Visit (INDEPENDENT_AMBULATORY_CARE_PROVIDER_SITE_OTHER): Payer: Medicare Other | Admitting: Dermatology

## 2021-10-08 DIAGNOSIS — D1800 Hemangioma unspecified site: Secondary | ICD-10-CM

## 2021-10-08 DIAGNOSIS — L578 Other skin changes due to chronic exposure to nonionizing radiation: Secondary | ICD-10-CM | POA: Diagnosis not present

## 2021-10-08 DIAGNOSIS — D229 Melanocytic nevi, unspecified: Secondary | ICD-10-CM

## 2021-10-08 DIAGNOSIS — L821 Other seborrheic keratosis: Secondary | ICD-10-CM

## 2021-10-08 DIAGNOSIS — L570 Actinic keratosis: Secondary | ICD-10-CM

## 2021-10-08 DIAGNOSIS — L814 Other melanin hyperpigmentation: Secondary | ICD-10-CM

## 2021-10-08 DIAGNOSIS — L719 Rosacea, unspecified: Secondary | ICD-10-CM

## 2021-10-08 DIAGNOSIS — D692 Other nonthrombocytopenic purpura: Secondary | ICD-10-CM

## 2021-10-08 DIAGNOSIS — Z1283 Encounter for screening for malignant neoplasm of skin: Secondary | ICD-10-CM | POA: Diagnosis not present

## 2021-10-08 DIAGNOSIS — I872 Venous insufficiency (chronic) (peripheral): Secondary | ICD-10-CM

## 2021-10-08 DIAGNOSIS — Z85828 Personal history of other malignant neoplasm of skin: Secondary | ICD-10-CM

## 2021-10-08 NOTE — Progress Notes (Unsigned)
Follow-Up Visit   Subjective  Larry Mann is a 71 y.o. male who presents for the following: Annual Exam (1 year tbse, hx of rosacea, hx of bcc). The patient presents for Total-Body Skin Exam (TBSE) for skin cancer screening and mole check.  The patient has spots, moles and lesions to be evaluated, some may be new or changing and the patient has concerns that these could be cancer.  The following portions of the chart were reviewed this encounter and updated as appropriate:  Tobacco  Allergies  Meds  Problems  Med Hx  Surg Hx  Fam Hx     Review of Systems: No other skin or systemic complaints except as noted in HPI or Assessment and Plan.  Objective  Well appearing patient in no apparent distress; mood and affect are within normal limits.  A full examination was performed including scalp, head, eyes, ears, nose, lips, neck, chest, axillae, abdomen, back, buttocks, bilateral upper extremities, bilateral lower extremities, hands, feet, fingers, toes, fingernails, and toenails. All findings within normal limits unless otherwise noted below.  face Clear today   scalp x 4 (4) Erythematous thin papules/macules with gritty scale.    Assessment & Plan  Rosacea face Rosacea is a chronic progressive skin condition usually affecting the face of adults, causing redness and/or acne bumps. It is treatable but not curable. It sometimes affects the eyes (ocular rosacea) as well. It may respond to topical and/or systemic medication and can flare with stress, sun exposure, alcohol, exercise and some foods.  Daily application of broad spectrum spf 30+ sunscreen to face is recommended to reduce flares. Chronic condition with duration or expected duration over one year. Currently well-controlled.  Actinic keratosis (4) scalp x 4 Actinic keratoses are precancerous spots that appear secondary to cumulative UV radiation exposure/sun exposure over time. They are chronic with expected duration  over 1 year. A portion of actinic keratoses will progress to squamous cell carcinoma of the skin. It is not possible to reliably predict which spots will progress to skin cancer and so treatment is recommended to prevent development of skin cancer.  Recommend daily broad spectrum sunscreen SPF 30+ to sun-exposed areas, reapply every 2 hours as needed.  Recommend staying in the shade or wearing long sleeves, sun glasses (UVA+UVB protection) and wide brim hats (4-inch brim around the entire circumference of the hat). Call for new or changing lesions.  Destruction of lesion - scalp x 4 Complexity: simple   Destruction method: cryotherapy   Informed consent: discussed and consent obtained   Timeout:  patient name, date of birth, surgical site, and procedure verified Lesion destroyed using liquid nitrogen: Yes   Region frozen until ice ball extended beyond lesion: Yes   Outcome: patient tolerated procedure well with no complications   Post-procedure details: wound care instructions given   Additional details:  Prior to procedure, discussed risks of blister formation, small wound, skin dyspigmentation, or rare scar following cryotherapy. Recommend Vaseline ointment to treated areas while healing.  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  Recommend daily graduated compression hose/stockings- easiest to put on first thing in morning, remove at bedtime.  Elevate legs as much as possible. Avoid salt/sodium rich foods.  Lentigines - Scattered tan macules - Due to sun exposure - Benign-appearing, observe - Recommend daily broad spectrum sunscreen SPF 30+  to sun-exposed areas, reapply every 2 hours as needed. - Call for any changes  Seborrheic Keratoses - Stuck-on, waxy, tan-brown papules and/or plaques  -  Benign-appearing - Discussed benign etiology and prognosis. - Observe - Call for any changes  Melanocytic Nevi - Tan-brown and/or pink-flesh-colored symmetric macules and papules - Benign appearing on exam today - Observation - Call clinic for new or changing moles - Recommend daily use of broad spectrum spf 30+ sunscreen to sun-exposed areas.   Purpura - Chronic; persistent and recurrent.  Treatable, but not curable. Arms  - Violaceous macules and patches - Benign - Related to trauma, age, sun damage and/or use of blood thinners, chronic use of topical and/or oral steroids - Observe - Can use OTC arnica containing moisturizer such as Dermend Bruise Formula if desired - Call for worsening or other concerns  Hemangiomas - Red papules - Discussed benign nature - Observe - Call for any changes  Actinic Damage - Chronic condition, secondary to cumulative UV/sun exposure - diffuse scaly erythematous macules with underlying dyspigmentation - Recommend daily broad spectrum sunscreen SPF 30+ to sun-exposed areas, reapply every 2 hours as needed.  - Staying in the shade or wearing long sleeves, sun glasses (UVA+UVB protection) and wide brim hats (4-inch brim around the entire circumference of the hat) are also recommended for sun protection.  - Call for new or changing lesions.  History of Basal Cell Carcinoma of the Skin right nasal supra tip 2011 - No evidence of recurrence today - Recommend regular full body skin exams - Recommend daily broad spectrum sunscreen SPF 30+ to sun-exposed areas, reapply every 2 hours as needed.  - Call if any new or changing lesions are noted between office visits  Skin cancer screening performed today. Return in about 1 year (around 10/09/2022) for TBSE. IRuthell Rummage, CMA, am acting as scribe for [name], MD. Documentation: I have reviewed the above documentation for accuracy and completeness, and I agree with the above.  [name],  MD

## 2021-10-08 NOTE — Patient Instructions (Addendum)
     Melanoma ABCDEs  Melanoma is the most dangerous type of skin cancer, and is the leading cause of death from skin disease.  You are more likely to develop melanoma if you: Have light-colored skin, light-colored eyes, or red or blond hair Spend a lot of time in the sun Tan regularly, either outdoors or in a tanning bed Have had blistering sunburns, especially during childhood Have a close family member who has had a melanoma Have atypical moles or large birthmarks  Early detection of melanoma is key since treatment is typically straightforward and cure rates are extremely high if we catch it early.   The first sign of melanoma is often a change in a mole or a new dark spot.  The ABCDE system is a way of remembering the signs of melanoma.  A for asymmetry:  The two halves do not match. B for border:  The edges of the growth are irregular. C for color:  A mixture of colors are present instead of an even brown color. D for diameter:  Melanomas are usually (but not always) greater than 6mm - the size of a pencil eraser. E for evolution:  The spot keeps changing in size, shape, and color.  Please check your skin once per month between visits. You can use a small mirror in front and a large mirror behind you to keep an eye on the back side or your body.   If you see any new or changing lesions before your next follow-up, please call to schedule a visit.  Please continue daily skin protection including broad spectrum sunscreen SPF 30+ to sun-exposed areas, reapplying every 2 hours as needed when you're outdoors.   Staying in the shade or wearing long sleeves, sun glasses (UVA+UVB protection) and wide brim hats (4-inch brim around the entire circumference of the hat) are also recommended for sun protection.    Due to recent changes in healthcare laws, you may see results of your pathology and/or laboratory studies on MyChart before the doctors have had a chance to review them. We  understand that in some cases there may be results that are confusing or concerning to you. Please understand that not all results are received at the same time and often the doctors may need to interpret multiple results in order to provide you with the best plan of care or course of treatment. Therefore, we ask that you please give us 2 business days to thoroughly review all your results before contacting the office for clarification. Should we see a critical lab result, you will be contacted sooner.   If You Need Anything After Your Visit  If you have any questions or concerns for your doctor, please call our main line at [phone] and press option 4 to reach your doctor's medical assistant. If no one answers, please leave a voicemail as directed and we will return your call as soon as possible. Messages left after 4 pm will be answered the following business day.   You may also send us a message via MyChart. We typically respond to MyChart messages within 1-2 business days.  For prescription refills, please ask your pharmacy to contact our office. Our fax number is [phone].  If you have an urgent issue when the clinic is closed that cannot wait until the next business day, you can page your doctor at the number below.    Please note that while we do our best to be available for urgent issues   outside of office hours, we are not available 24/7.   If you have an urgent issue and are unable to reach us, you may choose to seek medical care at your doctor's office, retail clinic, urgent care center, or emergency room.  If you have a medical emergency, please immediately call 911 or go to the emergency department.  Pager Numbers  - Dr. Kowalski: [phone]  - Dr. Moye: [phone]  - Dr. Stewart: [phone]  In the event of inclement weather, please call our main line at [phone] for an update on the status of any delays or closures.  Dermatology Medication Tips: Please  keep the boxes that topical medications come in in order to help keep track of the instructions about where and how to use these. Pharmacies typically print the medication instructions only on the boxes and not directly on the medication tubes.   If your medication is too expensive, please contact our office at [phone] option 4 or send us a message through MyChart.   We are unable to tell what your co-pay for medications will be in advance as this is different depending on your insurance coverage. However, we may be able to find a substitute medication at lower cost or fill out paperwork to get insurance to cover a needed medication.   If a prior authorization is required to get your medication covered by your insurance company, please allow us 1-2 business days to complete this process.  Drug prices often vary depending on where the prescription is filled and some pharmacies may offer cheaper prices.  The website www.goodrx.com contains coupons for medications through different pharmacies. The prices here do not account for what the cost may be with help from insurance (it may be cheaper with your insurance), but the website can give you the price if you did not use any insurance.  - You can print the associated coupon and take it with your prescription to the pharmacy.  - You may also stop by our office during regular business hours and pick up a GoodRx coupon card.  - If you need your prescription sent electronically to a different pharmacy, notify our office through [facility] MyChart or by phone at [phone] option 4.     Si Usted Necesita Algo Despus de Su Visita  Tambin puede enviarnos un mensaje a travs de MyChart. Por lo general respondemos a los mensajes de MyChart en el transcurso de 1 a 2 das hbiles.  Para renovar recetas, por favor pida a su farmacia que se ponga en contacto con nuestra oficina. Nuestro nmero de fax es el [phone].  Si tiene un asunto urgente  cuando la clnica est cerrada y que no puede esperar hasta el siguiente da hbil, puede llamar/localizar a su doctor(a) al nmero que aparece a continuacin.   Por favor, tenga en cuenta que aunque hacemos todo lo posible para estar disponibles para asuntos urgentes fuera del horario de oficina, no estamos disponibles las 24 horas del da, los 7 das de la semana.   Si tiene un problema urgente y no puede comunicarse con nosotros, puede optar por buscar atencin mdica  en el consultorio de su doctor(a), en una clnica privada, en un centro de atencin urgente o en una sala de emergencias.  Si tiene una emergencia mdica, por favor llame inmediatamente al 911 o vaya a la sala de emergencias.  Nmeros de bper  - Dr. Kowalski: [phone]  - Dra. Moye: [phone]  - Dra. Stewart: [phone]  En caso   de inclemencias del tiempo, por favor llame a nuestra lnea principal al [phone] para una actualizacin sobre el estado de cualquier retraso o cierre.  Consejos para la medicacin en dermatologa: Por favor, guarde las cajas en las que vienen los medicamentos de uso tpico para ayudarle a seguir las instrucciones sobre dnde y cmo usarlos. Las farmacias generalmente imprimen las instrucciones del medicamento slo en las cajas y no directamente en los tubos del medicamento.   Si su medicamento es muy caro, por favor, pngase en contacto con nuestra oficina llamando al [phone] y presione la opcin 4 o envenos un mensaje a travs de MyChart.   No podemos decirle cul ser su copago por los medicamentos por adelantado ya que esto es diferente dependiendo de la cobertura de su seguro. Sin embargo, es posible que podamos encontrar un medicamento sustituto a menor costo o llenar un formulario para que el seguro cubra el medicamento que se considera necesario.   Si se requiere una autorizacin previa para que su compaa de seguros cubra su medicamento, por favor permtanos de 1 a 2  das hbiles para completar este proceso.  Los precios de los medicamentos varan con frecuencia dependiendo del lugar de dnde se surte la receta y alguna farmacias pueden ofrecer precios ms baratos.  El sitio web www.goodrx.com tiene cupones para medicamentos de diferentes farmacias. Los precios aqu no tienen en cuenta lo que podra costar con la ayuda del seguro (puede ser ms barato con su seguro), pero el sitio web puede darle el precio si no utiliz ningn seguro.  - Puede imprimir el cupn correspondiente y llevarlo con su receta a la farmacia.  - Tambin puede pasar por nuestra oficina durante el horario de atencin regular y recoger una tarjeta de cupones de GoodRx.  - Si necesita que su receta se enve electrnicamente a una farmacia diferente, informe a nuestra oficina a travs de MyChart de [place] o por telfono llamando al [phone] y presione la opcin 4.  

## 2021-10-09 ENCOUNTER — Encounter: Payer: Self-pay | Admitting: Dermatology

## 2021-10-15 ENCOUNTER — Other Ambulatory Visit
Admission: RE | Admit: 2021-10-15 | Discharge: 2021-10-15 | Disposition: A | Discharge status: Home / Self Care | Payer: Medicare Other | Source: Ambulatory Visit | Attending: Gastroenterology | Admitting: Gastroenterology

## 2021-10-15 DIAGNOSIS — K7460 Unspecified cirrhosis of liver: Secondary | ICD-10-CM | POA: Diagnosis present

## 2021-10-15 DIAGNOSIS — R053 Chronic cough: Secondary | ICD-10-CM | POA: Insufficient documentation

## 2021-10-15 DIAGNOSIS — R0602 Shortness of breath: Secondary | ICD-10-CM | POA: Insufficient documentation

## 2021-10-15 DIAGNOSIS — R188 Other ascites: Secondary | ICD-10-CM | POA: Insufficient documentation

## 2021-10-15 LAB — BRAIN NATRIURETIC PEPTIDE: B Natriuretic Peptide: >4500 pg/mL — ABNORMAL HIGH (ref 0.0–100.0)

## 2021-10-16 ENCOUNTER — Ambulatory Visit
Admission: RE | Admit: 2021-10-16 | Discharge: 2021-10-16 | Disposition: A | Discharge status: Home / Self Care | Payer: Medicare Other | Source: Ambulatory Visit | Attending: Gastroenterology | Admitting: Gastroenterology

## 2021-10-16 ENCOUNTER — Other Ambulatory Visit: Payer: Self-pay | Admitting: Gastroenterology

## 2021-10-16 DIAGNOSIS — R0602 Shortness of breath: Secondary | ICD-10-CM

## 2021-10-16 DIAGNOSIS — J189 Pneumonia, unspecified organism: Secondary | ICD-10-CM

## 2021-10-16 DIAGNOSIS — R053 Chronic cough: Secondary | ICD-10-CM | POA: Diagnosis present

## 2021-10-16 DIAGNOSIS — K7460 Unspecified cirrhosis of liver: Secondary | ICD-10-CM

## 2021-11-01 ENCOUNTER — Ambulatory Visit
Admission: RE | Admit: 2021-11-01 | Discharge: 2021-11-01 | Disposition: A | Discharge status: Home / Self Care | Payer: Medicare Other | Source: Ambulatory Visit | Attending: Gastroenterology | Admitting: Gastroenterology

## 2021-11-01 DIAGNOSIS — K7460 Unspecified cirrhosis of liver: Secondary | ICD-10-CM | POA: Insufficient documentation

## 2021-11-01 DIAGNOSIS — R188 Other ascites: Secondary | ICD-10-CM | POA: Insufficient documentation

## 2021-11-09 ENCOUNTER — Other Ambulatory Visit: Payer: Self-pay | Admitting: Gastroenterology

## 2021-11-09 DIAGNOSIS — K828 Other specified diseases of gallbladder: Secondary | ICD-10-CM

## 2021-11-09 DIAGNOSIS — R110 Nausea: Secondary | ICD-10-CM

## 2021-11-09 DIAGNOSIS — K7031 Alcoholic cirrhosis of liver with ascites: Secondary | ICD-10-CM

## 2021-11-09 DIAGNOSIS — R935 Abnormal findings on diagnostic imaging of other abdominal regions, including retroperitoneum: Secondary | ICD-10-CM

## 2022-01-22 IMAGING — CR DG CHEST 2V
2 series · 2 of 2 positions shown · non-contrast
Comparison: 10/07/2012

CLINICAL DATA: Cough and shortness of breath for 2 days

EXAM:
CHEST - 2 VIEW

[chest ap]
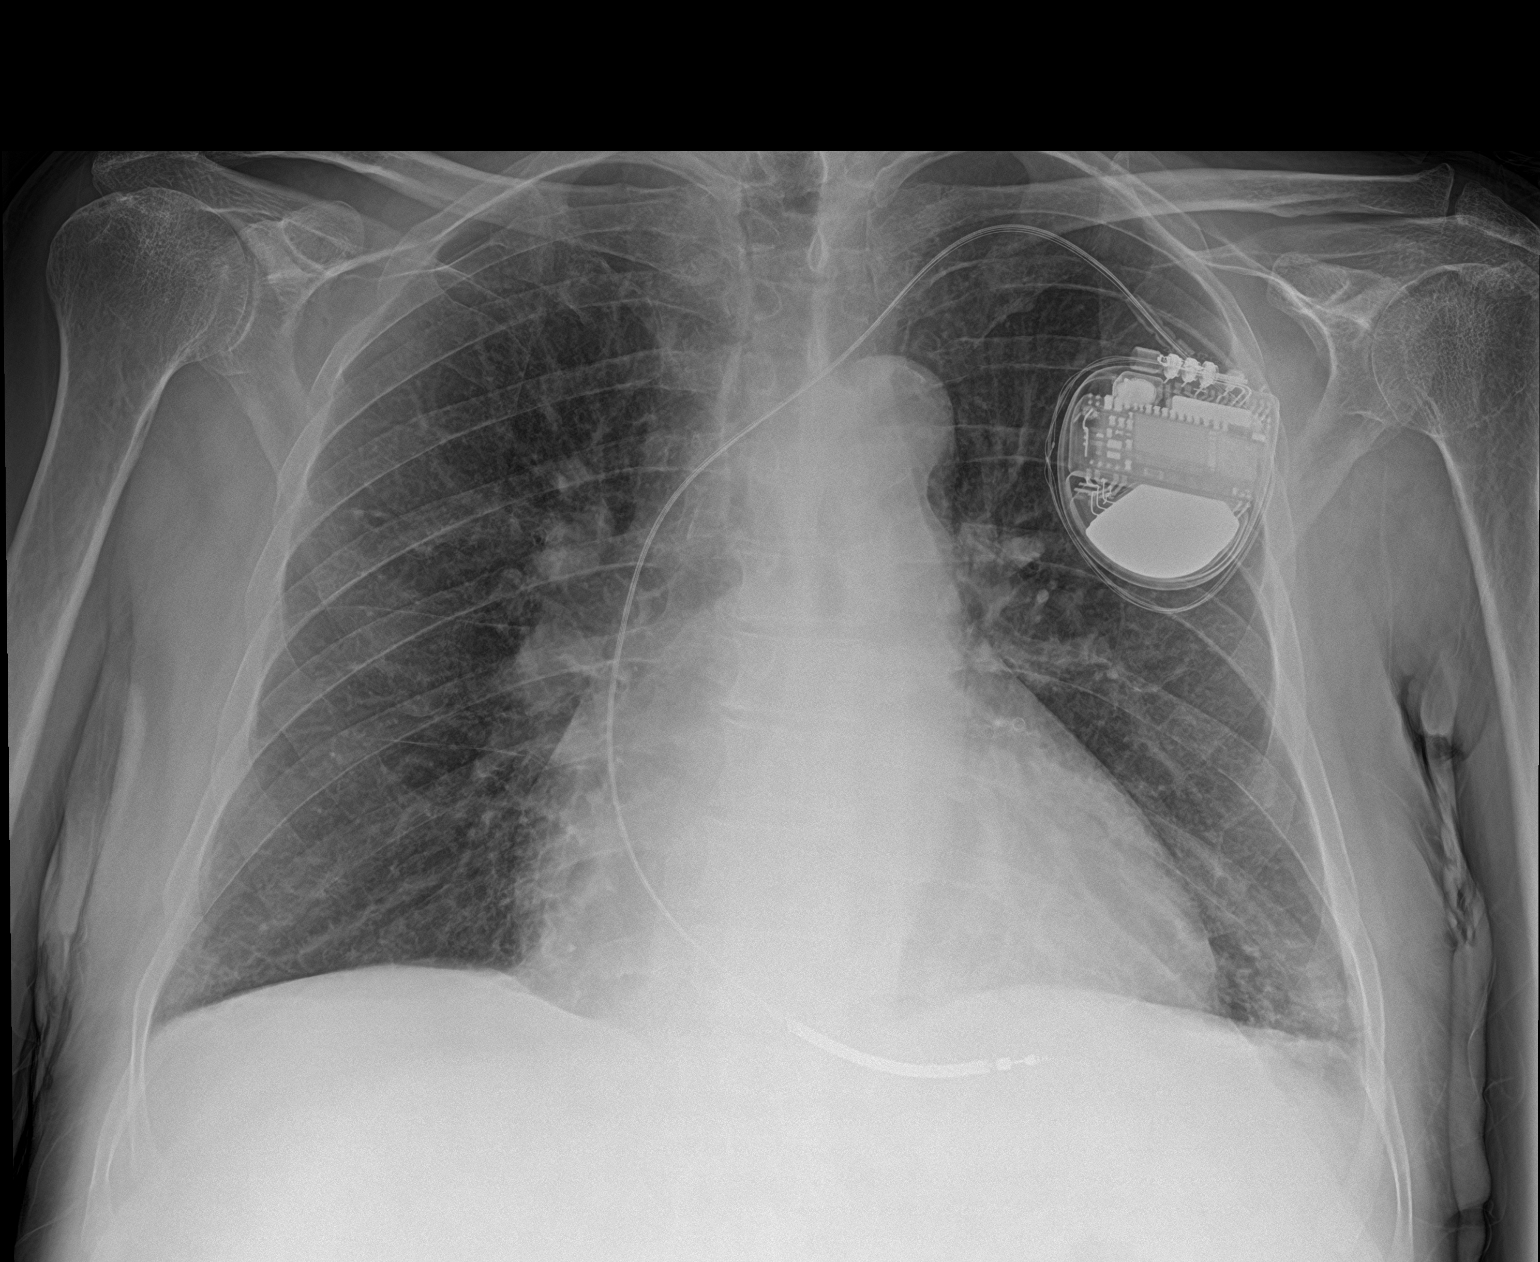

[chest lat]
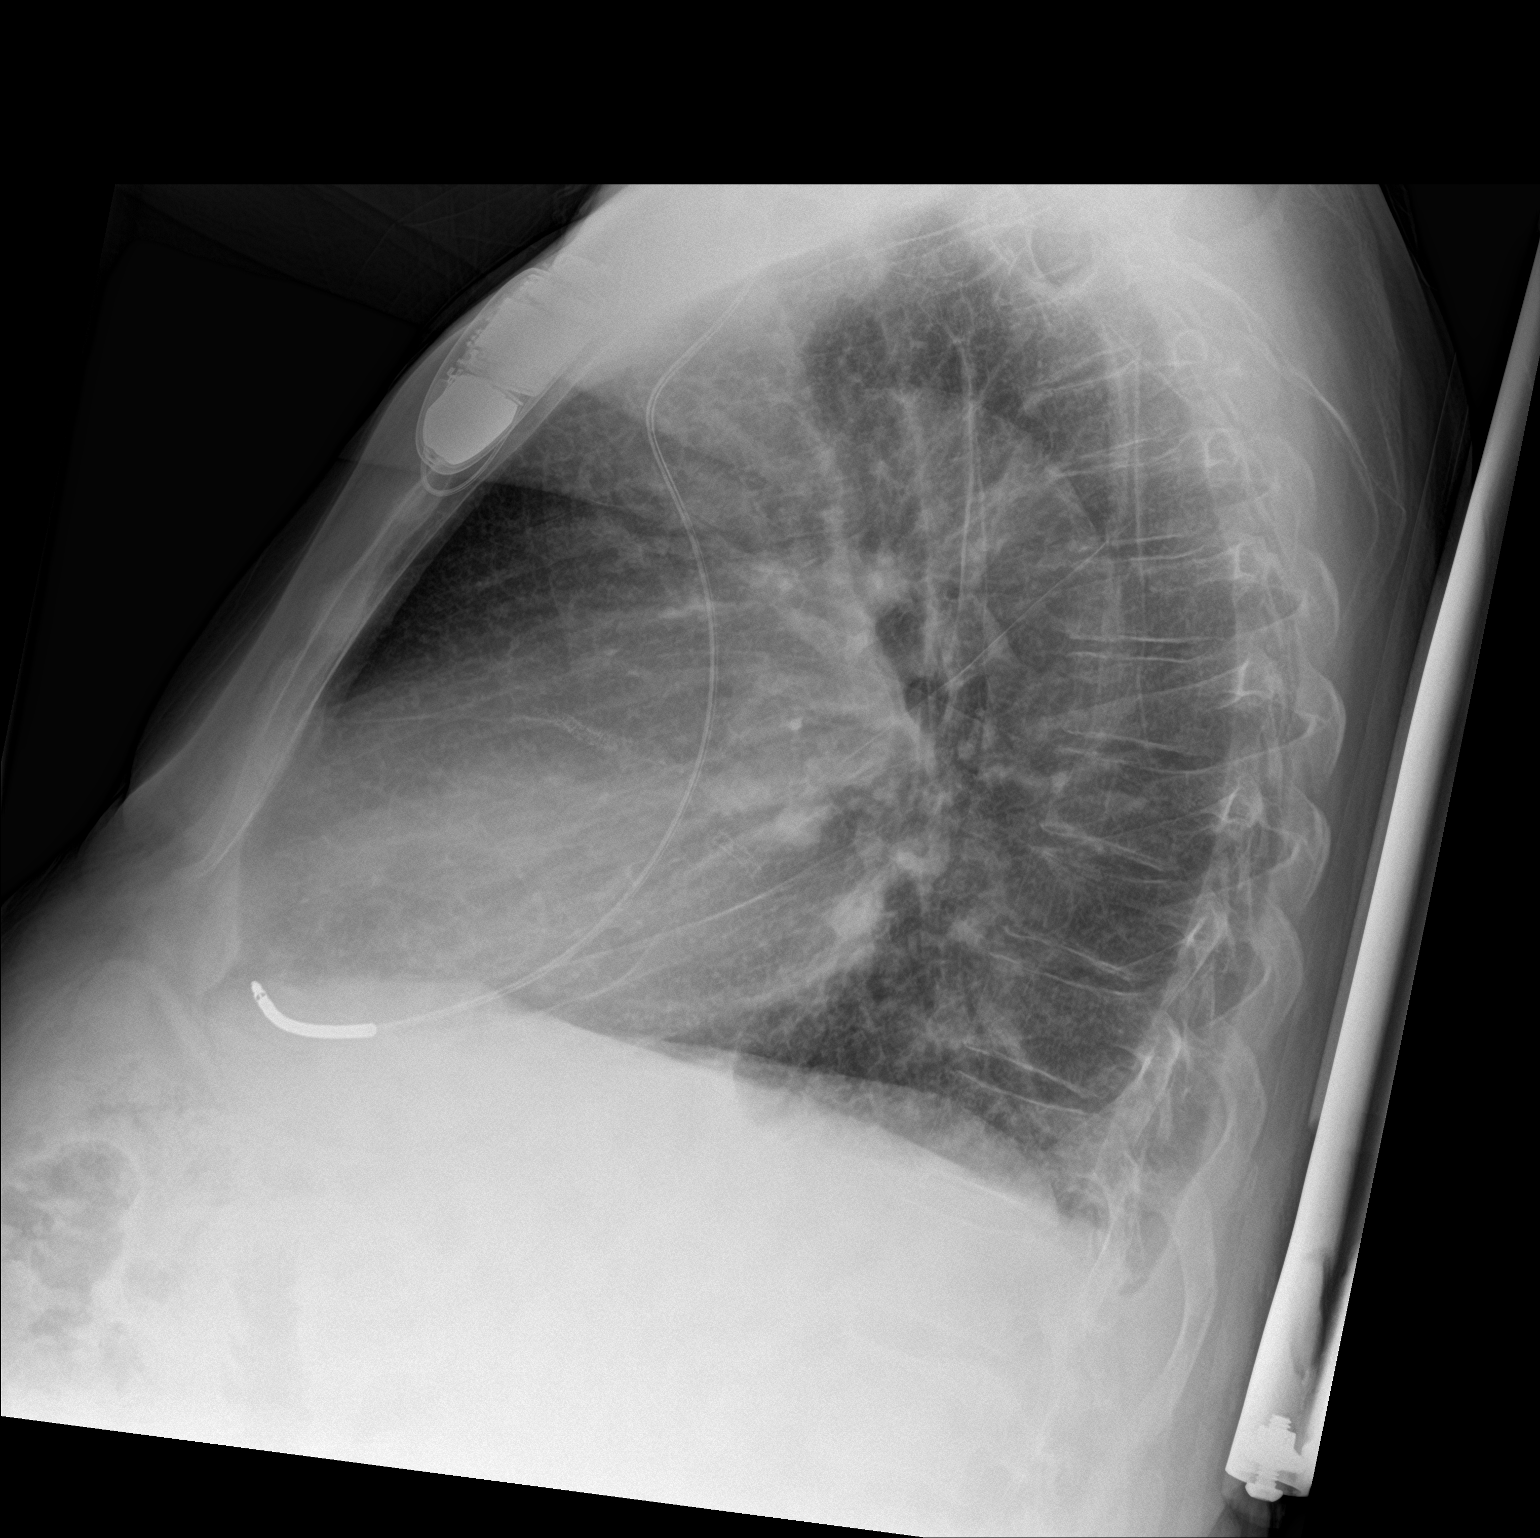

[2 of 2 positions shown; findings below may reference images not displayed]

FINDINGS: Cardiac shadow is enlarged. Defibrillator is noted. The lungs are
well aerated bilaterally. No focal infiltrate or sizable effusion is
seen. No bony abnormality is noted.
IMPRESSION: No acute abnormality

## 2022-01-22 IMAGING — CT CT RENAL STONE PROTOCOL
2 of 4 series · 16 of 46 positions shown, 18 images · non-contrast
Comparison: 01/05/2011

CLINICAL DATA: Flank pain and fevers

EXAM:
CT ABDOMEN AND PELVIS WITHOUT CONTRAST
TECHNIQUE: Multidetector CT imaging of the abdomen and pelvis was performed
following the standard protocol without IV contrast.

[Series 2: stone full standard · axial · 0.91mm/px · z∈[-1119,-709]mm · 13 of 90 slices shown, 15 images]
[im 4/90  soft-tissue]
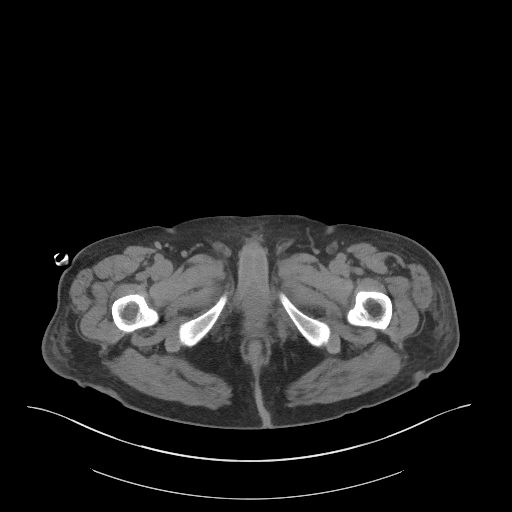
[im 4/90  bone]
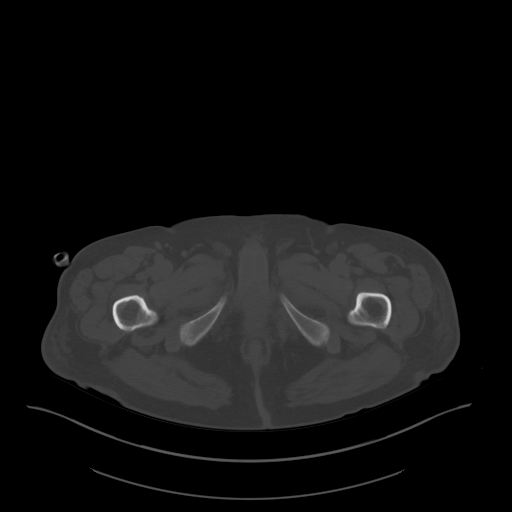
[im 11/90  soft-tissue]
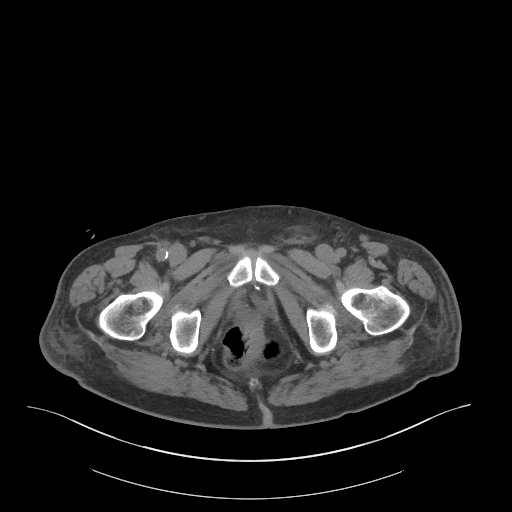
[im 18/90  soft-tissue]
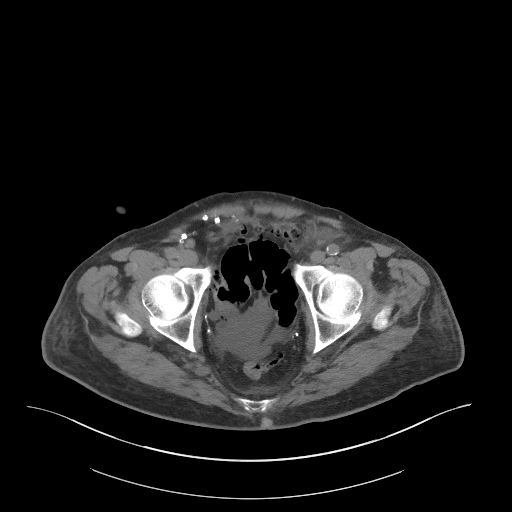
[im 25/90  soft-tissue]
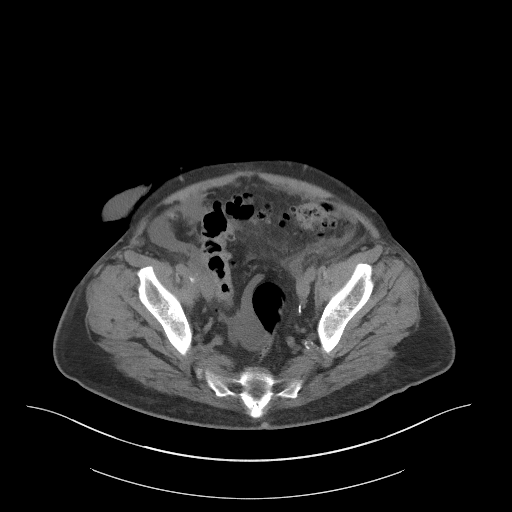
[im 33/90  soft-tissue]
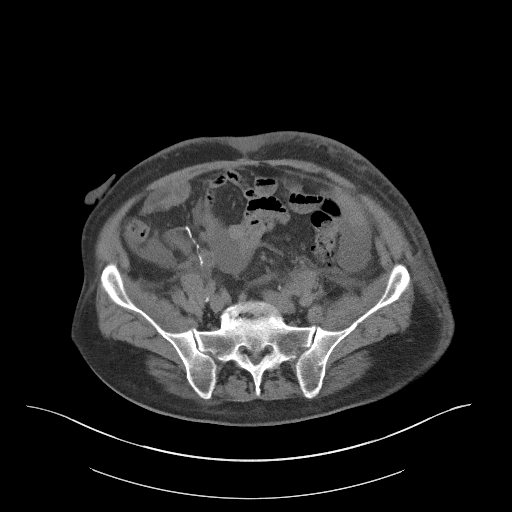
[im 40/90  soft-tissue]
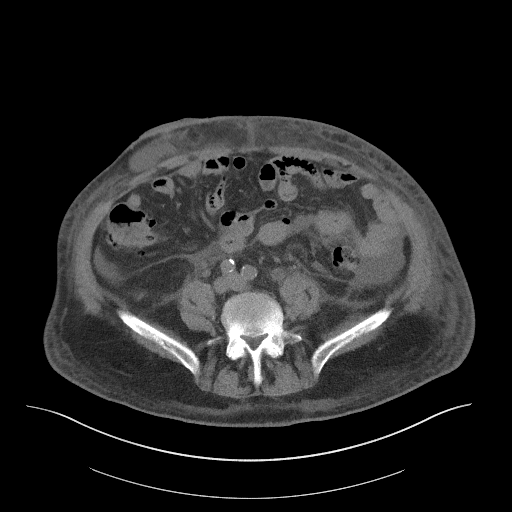
[im 47/90  soft-tissue]
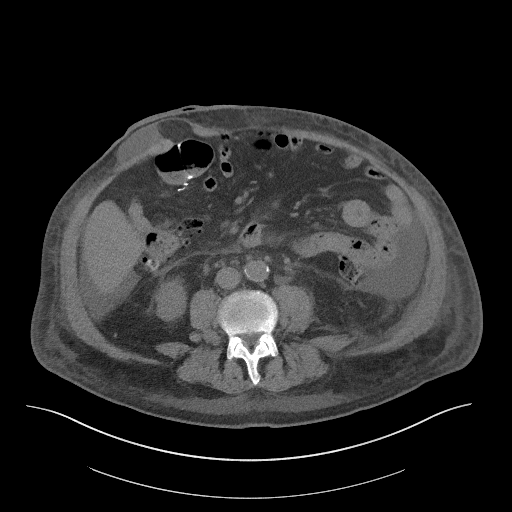
[im 50/90  soft-tissue]
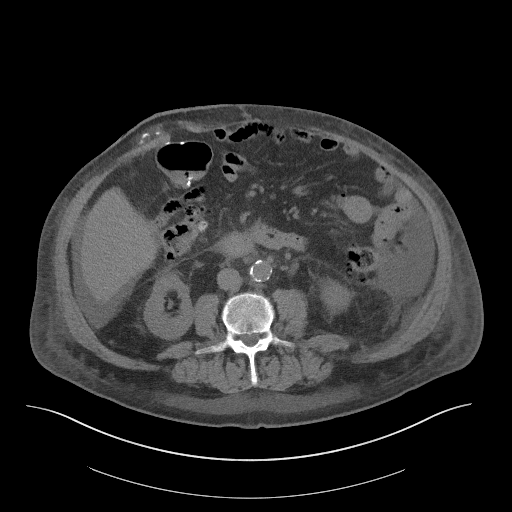
[im 57/90  soft-tissue]
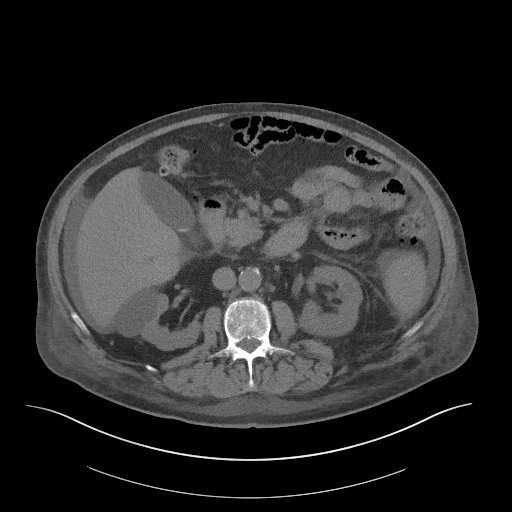
[im 57/90  bone]
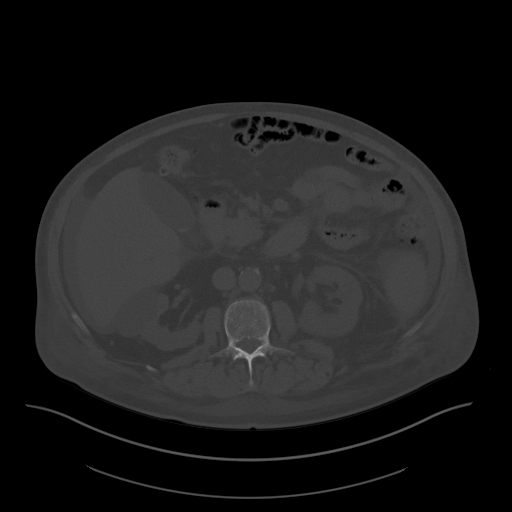
[im 65/90  soft-tissue]
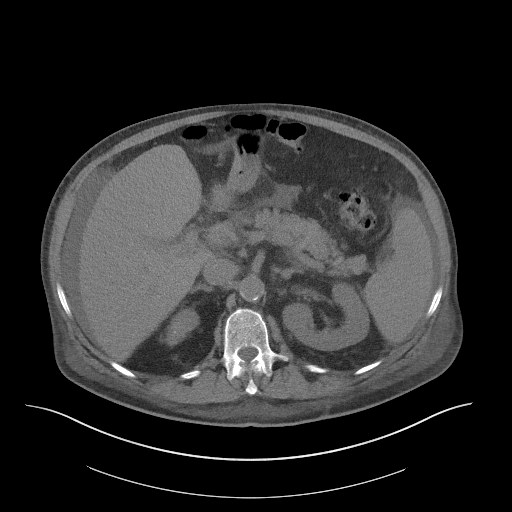
[im 72/90  soft-tissue]
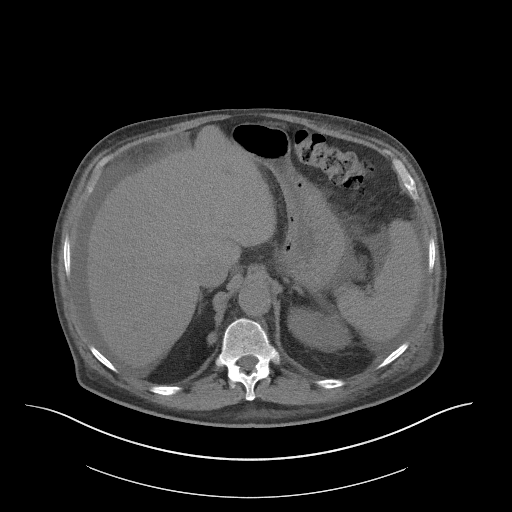
[im 79/90  soft-tissue]
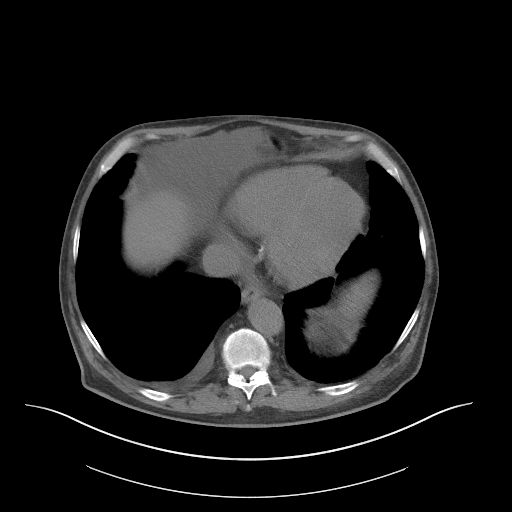
[im 86/90  soft-tissue]
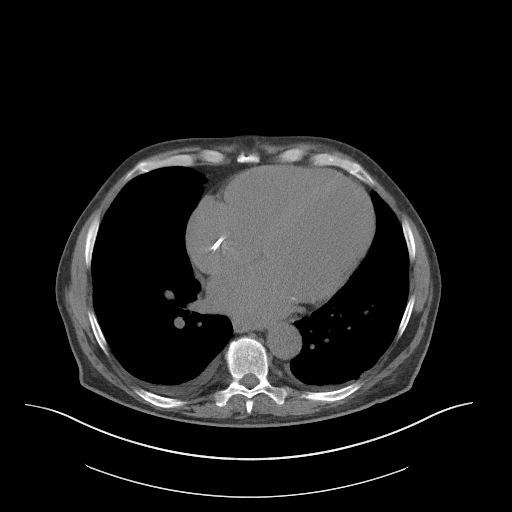

[Series 5: coronal · coronal · 0.81mm/px · 3 of 154 slices shown]
[im 52/154  soft-tissue]
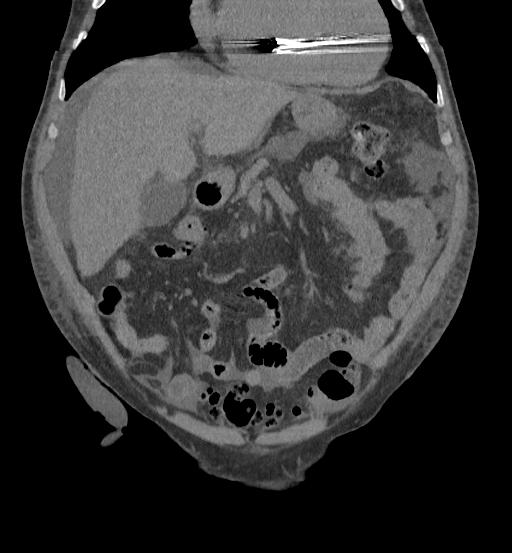
[im 69/154  soft-tissue]
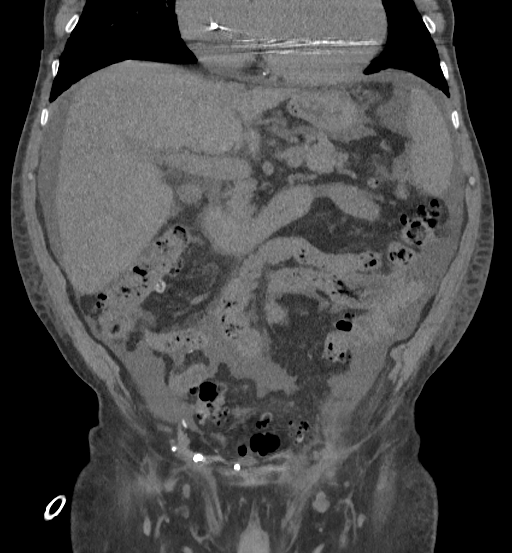
[im 86/154  soft-tissue]
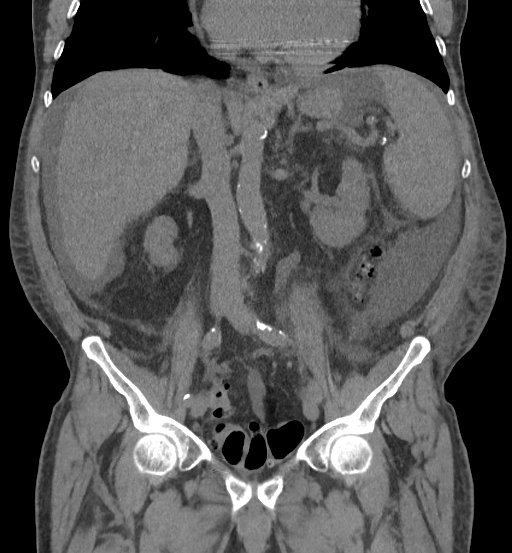

[16 of 46 positions shown; findings below may reference images not displayed]

FINDINGS: Lower chest: Small effusions are noted bilaterally right greater
than left. No focal infiltrate is seen.

Hepatobiliary: Gallbladder is well distended. Multiple dependent
gallstones are seen. No pericholecystic fluid is noted. The liver
demonstrates a normal appearance. Perihepatic fluid is noted
consistent with ascites.

Pancreas: Unremarkable. No pancreatic ductal dilatation or
surrounding inflammatory changes.

Spleen: Normal in size without focal abnormality.

Adrenals/Urinary Tract: Adrenal glands are within normal limits.
Kidneys demonstrate a normal appearance without obstructive change
bilaterally. Lower pole renal stone is noted on the left measuring
8-9 mm. This is slightly smaller than that seen on the prior exam.
Right renal cyst is again noted and stable. Mild fullness of the
collecting systems is seen without obstructing stone. Bladder has
been surgically removed. A ileal loop is noted in the right mid
abdomen.

Stomach/Bowel: Diverticular change of the colon is noted without
evidence of diverticulitis. No obstructive or inflammatory changes
of the colon are seen. The appendix is within normal limits. Small
bowel and stomach are unremarkable with the exception of
postsurgical changes related to the ileal loop.

Vascular/Lymphatic: Aortic atherosclerosis. No enlarged abdominal or
pelvic lymph nodes.

Reproductive: Prostate is not visualized consistent with a prior
surgical history.

Other: Free fluid is noted throughout the abdomen and pelvis. Some
herniation of fluid through the ostomy loop in the right mid abdomen
is noted.

Musculoskeletal: No acute or significant osseous findings.
IMPRESSION: New ascites.

Cholelithiasis without complicating factors.

Nonobstructing left renal stone.

Ileal loop is seen without definitive obstructive change.

Diverticulosis without diverticulitis.

Small effusions.

## 2022-08-28 ENCOUNTER — Ambulatory Visit: Payer: Medicare Other | Admitting: Dermatology

## 2022-08-28 VITALS — BP 117/77 | HR 86

## 2022-08-28 DIAGNOSIS — Z1283 Encounter for screening for malignant neoplasm of skin: Secondary | ICD-10-CM | POA: Diagnosis not present

## 2022-08-28 DIAGNOSIS — W908XXA Exposure to other nonionizing radiation, initial encounter: Secondary | ICD-10-CM

## 2022-08-28 DIAGNOSIS — L820 Inflamed seborrheic keratosis: Secondary | ICD-10-CM | POA: Diagnosis not present

## 2022-08-28 DIAGNOSIS — L578 Other skin changes due to chronic exposure to nonionizing radiation: Secondary | ICD-10-CM | POA: Diagnosis not present

## 2022-08-28 DIAGNOSIS — L821 Other seborrheic keratosis: Secondary | ICD-10-CM

## 2022-08-28 DIAGNOSIS — D1801 Hemangioma of skin and subcutaneous tissue: Secondary | ICD-10-CM

## 2022-08-28 DIAGNOSIS — L570 Actinic keratosis: Secondary | ICD-10-CM

## 2022-08-28 DIAGNOSIS — D692 Other nonthrombocytopenic purpura: Secondary | ICD-10-CM

## 2022-08-28 DIAGNOSIS — Z7189 Other specified counseling: Secondary | ICD-10-CM

## 2022-08-28 DIAGNOSIS — D229 Melanocytic nevi, unspecified: Secondary | ICD-10-CM

## 2022-08-28 DIAGNOSIS — L853 Xerosis cutis: Secondary | ICD-10-CM

## 2022-08-28 DIAGNOSIS — Z85828 Personal history of other malignant neoplasm of skin: Secondary | ICD-10-CM

## 2022-08-28 DIAGNOSIS — I872 Venous insufficiency (chronic) (peripheral): Secondary | ICD-10-CM

## 2022-08-28 DIAGNOSIS — L814 Other melanin hyperpigmentation: Secondary | ICD-10-CM

## 2022-08-28 NOTE — Patient Instructions (Signed)
Due to recent changes in healthcare laws, you may see results of your pathology and/or laboratory studies on MyChart before the doctors have had a chance to review them. We understand that in some cases there may be results that are confusing or concerning to you. Please understand that not all results are received at the same time and often the doctors may need to interpret multiple results in order to provide you with the best plan of care or course of treatment. Therefore, we ask that you please give us 2 business days to thoroughly review all your results before contacting the office for clarification. Should we see a critical lab result, you will be contacted sooner.   If You Need Anything After Your Visit  If you have any questions or concerns for your doctor, please call our main line at [phone] and press option 4 to reach your doctor's medical assistant. If no one answers, please leave a voicemail as directed and we will return your call as soon as possible. Messages left after 4 pm will be answered the following business day.   You may also send us a message via MyChart. We typically respond to MyChart messages within 1-2 business days.  For prescription refills, please ask your pharmacy to contact our office. Our fax number is [phone].  If you have an urgent issue when the clinic is closed that cannot wait until the next business day, you can page your doctor at the number below.    Please note that while we do our best to be available for urgent issues outside of office hours, we are not available 24/7.   If you have an urgent issue and are unable to reach us, you may choose to seek medical care at your doctor's office, retail clinic, urgent care center, or emergency room.  If you have a medical emergency, please immediately call 911 or go to the emergency department.  Pager Numbers  - Dr. Kowalski: [phone]  - Dr. Moye: [phone]  - Dr. Stewart:  [phone]  In the event of inclement weather, please call our main line at [phone] for an update on the status of any delays or closures.  Dermatology Medication Tips: Please keep the boxes that topical medications come in in order to help keep track of the instructions about where and how to use these. Pharmacies typically print the medication instructions only on the boxes and not directly on the medication tubes.   If your medication is too expensive, please contact our office at [phone] option 4 or send us a message through MyChart.   We are unable to tell what your co-pay for medications will be in advance as this is different depending on your insurance coverage. However, we may be able to find a substitute medication at lower cost or fill out paperwork to get insurance to cover a needed medication.   If a prior authorization is required to get your medication covered by your insurance company, please allow us 1-2 business days to complete this process.  Drug prices often vary depending on where the prescription is filled and some pharmacies may offer cheaper prices.  The website www.goodrx.com contains coupons for medications through different pharmacies. The prices here do not account for what the cost may be with help from insurance (it may be cheaper with your insurance), but the website can give you the price if you did not use any insurance.  - You can print the associated coupon and take it with   your prescription to the pharmacy.  - You may also stop by our office during regular business hours and pick up a GoodRx coupon card.  - If you need your prescription sent electronically to a different pharmacy, notify our office through [facility] MyChart or by phone at [phone] option 4.     Si Usted Necesita Algo Despus de Su Visita  Tambin puede enviarnos un mensaje a travs de MyChart. Por lo general respondemos a los mensajes de MyChart en el transcurso de 1 a 2  das hbiles.  Para renovar recetas, por favor pida a su farmacia que se ponga en contacto con nuestra oficina. Nuestro nmero de fax es el [phone].  Si tiene un asunto urgente cuando la clnica est cerrada y que no puede esperar hasta el siguiente da hbil, puede llamar/localizar a su doctor(a) al nmero que aparece a continuacin.   Por favor, tenga en cuenta que aunque hacemos todo lo posible para estar disponibles para asuntos urgentes fuera del horario de oficina, no estamos disponibles las 24 horas del da, los 7 das de la semana.   Si tiene un problema urgente y no puede comunicarse con nosotros, puede optar por buscar atencin mdica  en el consultorio de su doctor(a), en una clnica privada, en un centro de atencin urgente o en una sala de emergencias.  Si tiene una emergencia mdica, por favor llame inmediatamente al 911 o vaya a la sala de emergencias.  Nmeros de bper  - Dr. Kowalski: [phone]  - Dra. Moye: [phone]  - Dra. Stewart: [phone]  En caso de inclemencias del tiempo, por favor llame a nuestra lnea principal al [phone] para una actualizacin sobre el estado de cualquier retraso o cierre.  Consejos para la medicacin en dermatologa: Por favor, guarde las cajas en las que vienen los medicamentos de uso tpico para ayudarle a seguir las instrucciones sobre dnde y cmo usarlos. Las farmacias generalmente imprimen las instrucciones del medicamento slo en las cajas y no directamente en los tubos del medicamento.   Si su medicamento es muy caro, por favor, pngase en contacto con nuestra oficina llamando al [phone] y presione la opcin 4 o envenos un mensaje a travs de MyChart.   No podemos decirle cul ser su copago por los medicamentos por adelantado ya que esto es diferente dependiendo de la cobertura de su seguro. Sin embargo, es posible que podamos encontrar un medicamento sustituto a menor costo o llenar un formulario para que el  seguro cubra el medicamento que se considera necesario.   Si se requiere una autorizacin previa para que su compaa de seguros cubra su medicamento, por favor permtanos de 1 a 2 das hbiles para completar este proceso.  Los precios de los medicamentos varan con frecuencia dependiendo del lugar de dnde se surte la receta y alguna farmacias pueden ofrecer precios ms baratos.  El sitio web www.goodrx.com tiene cupones para medicamentos de diferentes farmacias. Los precios aqu no tienen en cuenta lo que podra costar con la ayuda del seguro (puede ser ms barato con su seguro), pero el sitio web puede darle el precio si no utiliz ningn seguro.  - Puede imprimir el cupn correspondiente y llevarlo con su receta a la farmacia.  - Tambin puede pasar por nuestra oficina durante el horario de atencin regular y recoger una tarjeta de cupones de GoodRx.  - Si necesita que su receta se enve electrnicamente a una farmacia diferente, informe a nuestra oficina a travs de MyChart de [facility]   o por telfono llamando al [phone] y presione la opcin 4.  

## 2022-08-28 NOTE — Progress Notes (Signed)
Follow-Up Visit   Subjective  Larry Mann is a 72 y.o. male who presents for the following: Skin Cancer Screening and Full Body Skin Exam  The patient presents for Total-Body Skin Exam (TBSE) for skin cancer screening and mole check. The patient has spots, moles and lesions to be evaluated, some may be new or changing and the patient has concerns that these could be cancer.    The following portions of the chart were reviewed this encounter and updated as appropriate: medications, allergies, medical history  Review of Systems:  No other skin or systemic complaints except as noted in HPI or Assessment and Plan.  Objective  Well appearing patient in no apparent distress; mood and affect are within normal limits.  A full examination was performed including scalp, head, eyes, ears, nose, lips, neck, chest, axillae, abdomen, back, buttocks, bilateral upper extremities, bilateral lower extremities, hands, feet, fingers, toes, fingernails, and toenails. All findings within normal limits unless otherwise noted below.   Relevant physical exam findings are noted in the Assessment and Plan.  Scalp x 4 (4) Erythematous thin papules/macules with gritty scale.   L preauricular x 1, scalp x 1 (2) Erythematous stuck-on, waxy papule or plaque    Assessment & Plan   LENTIGINES, SEBORRHEIC KERATOSES, HEMANGIOMAS - Benign normal skin lesions - Benign-appearing - Call for any changes  MELANOCYTIC NEVI - Tan-brown and/or pink-flesh-colored symmetric macules and papules - Benign appearing on exam today - Observation - Call clinic for new or changing moles - Recommend daily use of broad spectrum spf 30+ sunscreen to sun-exposed areas.   ACTINIC DAMAGE - Chronic condition, secondary to cumulative UV/sun exposure - diffuse scaly erythematous macules with underlying dyspigmentation - Recommend daily broad spectrum sunscreen SPF 30+ to sun-exposed areas, reapply every 2 hours as needed.  -  Staying in the shade or wearing long sleeves, sun glasses (UVA+UVB protection) and wide brim hats (4-inch brim around the entire circumference of the hat) are also recommended for sun protection.  - Call for new or changing lesions.  HISTORY OF BASAL CELL CARCINOMA OF THE SKIN - No evidence of recurrence today - Recommend regular full body skin exams - Recommend daily broad spectrum sunscreen SPF 30+ to sun-exposed areas, reapply every 2 hours as needed.  - Call if any new or changing lesions are noted between office visits  Purpura - Chronic; persistent and recurrent.  Treatable, but not curable. - Violaceous macules and patches - Benign - Related to trauma, age, sun damage and/or use of blood thinners, chronic use of topical and/or oral steroids - Observe - Can use OTC arnica containing moisturizer such as Dermend Bruise Formula if desired - Call for worsening or other concerns  Xerosis - diffuse xerotic patches - recommend gentle, hydrating skin care - gentle skin care handout given  AK (actinic keratosis) (4) Scalp x 4  Destruction of lesion - Scalp x 4 Complexity: simple   Destruction method: cryotherapy   Informed consent: discussed and consent obtained   Timeout:  patient name, date of birth, surgical site, and procedure verified Lesion destroyed using liquid nitrogen: Yes   Region frozen until ice ball extended beyond lesion: Yes   Outcome: patient tolerated procedure well with no complications   Post-procedure details: wound care instructions given    Inflamed seborrheic keratosis (2) L preauricular x 1, scalp x 1  Symptomatic, irritating, patient would like treated.   Destruction of lesion - L preauricular x 1, scalp x 1 Complexity: simple  Destruction method: cryotherapy   Informed consent: discussed and consent obtained   Timeout:  patient name, date of birth, surgical site, and procedure verified Lesion destroyed using liquid nitrogen: Yes   Region frozen  until ice ball extended beyond lesion: Yes   Outcome: patient tolerated procedure well with no complications   Post-procedure details: wound care instructions given     STASIS DERMATITIS Exam: Erythematous, scaly patches involving the ankle and distal lower leg with associated lower leg edema.  Chronic and persistent condition with duration or expected duration over one year. Condition is bothersome/symptomatic for patient. Currently flared.  Stasis in the legs causes chronic leg swelling, which may result in itchy or painful rashes, skin discoloration, skin texture changes, and sometimes ulceration.  Recommend daily graduated compression hose/stockings- easiest to put on first thing in morning, remove at bedtime.  Elevate legs as much as possible. Avoid salt/sodium rich foods.  Treatment Plan: No tx needed.   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   SKIN CANCER SCREENING PERFORMED TODAY.   Return in about 1 year (around 08/28/2023) for TBSE.  Maylene Roes, CMA, am acting as scribe for Armida Sans, MD .  Documentation: I have reviewed the above documentation for accuracy and completeness, and I agree with the above.  Armida Sans, MD

## 2022-08-30 ENCOUNTER — Encounter: Payer: Self-pay | Admitting: Dermatology

## 2022-10-02 ENCOUNTER — Ambulatory Visit (INDEPENDENT_AMBULATORY_CARE_PROVIDER_SITE_OTHER): Payer: Medicare Other | Admitting: Dermatology

## 2022-10-02 VITALS — BP 142/86 | HR 79

## 2022-10-02 DIAGNOSIS — L578 Other skin changes due to chronic exposure to nonionizing radiation: Secondary | ICD-10-CM

## 2022-10-02 DIAGNOSIS — D692 Other nonthrombocytopenic purpura: Secondary | ICD-10-CM | POA: Diagnosis not present

## 2022-10-02 DIAGNOSIS — L820 Inflamed seborrheic keratosis: Secondary | ICD-10-CM | POA: Diagnosis not present

## 2022-10-02 DIAGNOSIS — L821 Other seborrheic keratosis: Secondary | ICD-10-CM

## 2022-10-02 DIAGNOSIS — W908XXA Exposure to other nonionizing radiation, initial encounter: Secondary | ICD-10-CM

## 2022-10-02 NOTE — Progress Notes (Signed)
   Follow-Up Visit   Subjective  Larry Mann is a 72 y.o. male who presents for the following: The patient has spots, moles and lesions to be evaluated, some may be new or changing and the patient may have concern these could be cancer.   The following portions of the chart were reviewed this encounter and updated as appropriate: medications, allergies, medical history  Review of Systems:  No other skin or systemic complaints except as noted in HPI or Assessment and Plan.  Objective  Well appearing patient in no apparent distress; mood and affect are within normal limits. A focused examination was performed of the following areas:face,arms Relevant exam findings are noted in the Assessment and Plan.  left mid dorsum forearm, left distal forearm (2) Stuck-on, waxy, tan-brown papule --Discussed benign etiology and prognosis.    Assessment & Plan   Purpura - Chronic; persistent and recurrent.  Treatable, but not curable. - Violaceous macules and patches - Benign - Related to trauma, age, sun damage and/or use of blood thinners, chronic use of topical and/or oral steroids - Observe - Can use OTC arnica containing moisturizer such as Dermend Bruise Formula if desired - Call for worsening or other concerns   Inflamed seborrheic keratosis (2) left mid dorsum forearm, left distal forearm  Symptomatic, irritating, patient would like treated.   Destruction of lesion - left mid dorsum forearm, left distal forearm (2) Complexity: simple   Destruction method: cryotherapy   Informed consent: discussed and consent obtained   Timeout:  patient name, date of birth, surgical site, and procedure verified Lesion destroyed using liquid nitrogen: Yes   Region frozen until ice ball extended beyond lesion: Yes   Outcome: patient tolerated procedure well with no complications   Post-procedure details: wound care instructions given    Actinic skin damage  Seborrheic keratosis  Purpura  (HCC)  SEBORRHEIC KERATOSIS - Stuck-on, waxy, tan-brown papules and/or plaques  - Benign-appearing - Discussed benign etiology and prognosis. - Observe - Call for any changes   ACTINIC DAMAGE - chronic, secondary to cumulative UV radiation exposure/sun exposure over time - diffuse scaly erythematous macules with underlying dyspigmentation - Recommend daily broad spectrum sunscreen SPF 30+ to sun-exposed areas, reapply every 2 hours as needed.  - Recommend staying in the shade or wearing long sleeves, sun glasses (UVA+UVB protection) and wide brim hats (4-inch brim around the entire circumference of the hat). - Call for new or changing lesions.  Return for scheduled appt  August 28, 2023.  IAngelique Holm, CMA, am acting as scribe for Armida Sans, MD .   Documentation: I have reviewed the above documentation for accuracy and completeness, and I agree with the above.  Armida Sans, MD

## 2022-10-02 NOTE — Patient Instructions (Signed)
Cryotherapy Aftercare  Wash gently with soap and water everyday.   Apply Vaseline and Band-Aid daily until healed.     Due to recent changes in healthcare laws, you may see results of your pathology and/or laboratory studies on MyChart before the doctors have had a chance to review them. We understand that in some cases there may be results that are confusing or concerning to you. Please understand that not all results are received at the same time and often the doctors may need to interpret multiple results in order to provide you with the best plan of care or course of treatment. Therefore, we ask that you please give us 2 business days to thoroughly review all your results before contacting the office for clarification. Should we see a critical lab result, you will be contacted sooner.   If You Need Anything After Your Visit  If you have any questions or concerns for your doctor, please call our main line at [phone] and press option 4 to reach your doctor's medical assistant. If no one answers, please leave a voicemail as directed and we will return your call as soon as possible. Messages left after 4 pm will be answered the following business day.   You may also send us a message via MyChart. We typically respond to MyChart messages within 1-2 business days.  For prescription refills, please ask your pharmacy to contact our office. Our fax number is [phone].  If you have an urgent issue when the clinic is closed that cannot wait until the next business day, you can page your doctor at the number below.    Please note that while we do our best to be available for urgent issues outside of office hours, we are not available 24/7.   If you have an urgent issue and are unable to reach us, you may choose to seek medical care at your doctor's office, retail clinic, urgent care center, or emergency room.  If you have a medical emergency, please immediately call 911 or go to the  emergency department.  Pager Numbers  - Dr. Kowalski: [phone]  - Dr. Moye: [phone]  - Dr. Stewart: [phone]  In the event of inclement weather, please call our main line at [phone] for an update on the status of any delays or closures.  Dermatology Medication Tips: Please keep the boxes that topical medications come in in order to help keep track of the instructions about where and how to use these. Pharmacies typically print the medication instructions only on the boxes and not directly on the medication tubes.   If your medication is too expensive, please contact our office at [phone] option 4 or send us a message through MyChart.   We are unable to tell what your co-pay for medications will be in advance as this is different depending on your insurance coverage. However, we may be able to find a substitute medication at lower cost or fill out paperwork to get insurance to cover a needed medication.   If a prior authorization is required to get your medication covered by your insurance company, please allow us 1-2 business days to complete this process.  Drug prices often vary depending on where the prescription is filled and some pharmacies may offer cheaper prices.  The website www.goodrx.com contains coupons for medications through different pharmacies. The prices here do not account for what the cost may be with help from insurance (it may be cheaper with your insurance), but the website can   give you the price if you did not use any insurance.  - You can print the associated coupon and take it with your prescription to the pharmacy.  - You may also stop by our office during regular business hours and pick up a GoodRx coupon card.  - If you need your prescription sent electronically to a different pharmacy, notify our office through [place] MyChart or by phone at [phone] option 4.     Si Usted Necesita Algo Despus de Su Visita  Tambin puede  enviarnos un mensaje a travs de MyChart. Por lo general respondemos a los mensajes de MyChart en el transcurso de 1 a 2 das hbiles.  Para renovar recetas, por favor pida a su farmacia que se ponga en contacto con nuestra oficina. Nuestro nmero de fax es el [phone].  Si tiene un asunto urgente cuando la clnica est cerrada y que no puede esperar hasta el siguiente da hbil, puede llamar/localizar a su doctor(a) al nmero que aparece a continuacin.   Por favor, tenga en cuenta que aunque hacemos todo lo posible para estar disponibles para asuntos urgentes fuera del horario de oficina, no estamos disponibles las 24 horas del da, los 7 das de la semana.   Si tiene un problema urgente y no puede comunicarse con nosotros, puede optar por buscar atencin mdica  en el consultorio de su doctor(a), en una clnica privada, en un centro de atencin urgente o en una sala de emergencias.  Si tiene una emergencia mdica, por favor llame inmediatamente al 911 o vaya a la sala de emergencias.  Nmeros de bper  - Dr. Kowalski: [phone]  - Dra. Moye: [phone]  - Dra. Stewart: [phone]  En caso de inclemencias del tiempo, por favor llame a nuestra lnea principal al [phone] para una actualizacin sobre el estado de cualquier retraso o cierre.  Consejos para la medicacin en dermatologa: Por favor, guarde las cajas en las que vienen los medicamentos de uso tpico para ayudarle a seguir las instrucciones sobre dnde y cmo usarlos. Las farmacias generalmente imprimen las instrucciones del medicamento slo en las cajas y no directamente en los tubos del medicamento.   Si su medicamento es muy caro, por favor, pngase en contacto con nuestra oficina llamando al [phone] y presione la opcin 4 o envenos un mensaje a travs de MyChart.   No podemos decirle cul ser su copago por los medicamentos por adelantado ya que esto es diferente dependiendo de la cobertura de su seguro.  Sin embargo, es posible que podamos encontrar un medicamento sustituto a menor costo o llenar un formulario para que el seguro cubra el medicamento que se considera necesario.   Si se requiere una autorizacin previa para que su compaa de seguros cubra su medicamento, por favor permtanos de 1 a 2 das hbiles para completar este proceso.  Los precios de los medicamentos varan con frecuencia dependiendo del lugar de dnde se surte la receta y alguna farmacias pueden ofrecer precios ms baratos.  El sitio web www.goodrx.com tiene cupones para medicamentos de diferentes farmacias. Los precios aqu no tienen en cuenta lo que podra costar con la ayuda del seguro (puede ser ms barato con su seguro), pero el sitio web puede darle el precio si no utiliz ningn seguro.  - Puede imprimir el cupn correspondiente y llevarlo con su receta a la farmacia.  - Tambin puede pasar por nuestra oficina durante el horario de atencin regular y recoger una tarjeta de cupones de GoodRx.  -   Si necesita que su receta se enve electrnicamente a una farmacia diferente, informe a nuestra oficina a travs de MyChart de [place] o por telfono llamando al [phone] y presione la opcin 4.  

## 2022-10-06 ENCOUNTER — Encounter: Payer: Self-pay | Admitting: Dermatology

## 2022-11-02 ENCOUNTER — Encounter: Payer: Self-pay | Admitting: Dermatology

## 2022-11-04 MED ORDER — TRIAMCINOLONE ACETONIDE 0.1 % EX CREA: TOPICAL_CREAM | CUTANEOUS | 0 refills | Status: DC

## 2022-12-24 ENCOUNTER — Telehealth: Payer: Self-pay

## 2022-12-24 ENCOUNTER — Ambulatory Visit (INDEPENDENT_AMBULATORY_CARE_PROVIDER_SITE_OTHER): Payer: Medicare Other | Admitting: Dermatology

## 2022-12-24 DIAGNOSIS — L280 Lichen simplex chronicus: Secondary | ICD-10-CM

## 2022-12-24 DIAGNOSIS — L299 Pruritus, unspecified: Secondary | ICD-10-CM

## 2022-12-24 DIAGNOSIS — L209 Atopic dermatitis, unspecified: Secondary | ICD-10-CM | POA: Diagnosis not present

## 2022-12-24 DIAGNOSIS — B360 Pityriasis versicolor: Secondary | ICD-10-CM

## 2022-12-24 DIAGNOSIS — Z79899 Other long term (current) drug therapy: Secondary | ICD-10-CM

## 2022-12-24 DIAGNOSIS — L509 Urticaria, unspecified: Secondary | ICD-10-CM

## 2022-12-24 DIAGNOSIS — L2089 Other atopic dermatitis: Secondary | ICD-10-CM

## 2022-12-24 DIAGNOSIS — Z7189 Other specified counseling: Secondary | ICD-10-CM

## 2022-12-24 MED ORDER — KETOCONAZOLE 2 % EX CREA
TOPICAL_CREAM | CUTANEOUS | 1 refills | Status: DC
Start: 2022-12-24 — End: 2023-01-15

## 2022-12-24 NOTE — Patient Instructions (Addendum)
For itchy rash at back and back of scalp/neck   Restart triamcinolone cream - apply topically to rash once daily   Avoid applying to face, groin, and axilla. Use as directed. Long-term use can cause thinning of the skin.  Topical steroids (such as triamcinolone, fluocinolone, fluocinonide, mometasone, clobetasol, halobetasol, betamethasone, hydrocortisone) can cause thinning and lightening of the skin if they are used for too long in the same area. Your physician has selected the right strength medicine for your problem and area affected on the body. Please use your medication only as directed by your physician to prevent side effects.    Start ketoconazole 2 % cream - apply topically to rash at back and back of neck once daily   Add antihistamine For Hives (urticaria); dermatographism or Itch: Start non sedating antihistamine (either Allegra 180mg , or Claritin 10mg , or Zyrtec 10mg ) daily.  All these are non-prescription ("Over the Counter").   Start out with 1 pill a day.   After a week if not improving may increase to 2 pills a day.   After another week if not improving may increase to 3 pills a day.   After another week if still not improving may take up to 4 pills a day. Stay at highest dose that keeps condition controlled, but only up to 4 pills a day. Stay at the controlling dose for at least 2 weeks. Contact office if taking 4 pills of antihistamine a day for at least 2 weeks without control of condition as other options may be available.    Due to recent changes in healthcare laws, you may see results of your pathology and/or laboratory studies on MyChart before the doctors have had a chance to review them. We understand that in some cases there may be results that are confusing or concerning to you. Please understand that not all results are received at the same time and often the doctors may need to interpret multiple results in order to provide you with the best plan of care or  course of treatment. Therefore, we ask that you please give [place] 2 business days to thoroughly review all your results before contacting the office for clarification. Should we see a critical lab result, you will be contacted sooner.   If You Need Anything After Your Visit  If you have any questions or concerns for your doctor, please call our main line at [phone] and press option 4 to reach your doctor's medical assistant. If no one answers, please leave a voicemail as directed and we will return your call as soon as possible. Messages left after 4 pm will be answered the following business day.   You may also send [place] a message via MyChart. We typically respond to MyChart messages within 1-2 business days.  For prescription refills, please ask your pharmacy to contact our office. Our fax number is [phone].  If you have an urgent issue when the clinic is closed that cannot wait until the next business day, you can page your doctor at the number below.    Please note that while we do our best to be available for urgent issues outside of office hours, we are not available 24/7.   If you have an urgent issue and are unable to reach [place], you may choose to seek medical care at your doctor's office, retail clinic, urgent care center, or emergency room.  If you have a medical emergency, please immediately call 911 or go to the emergency department.  Pager  Numbers  - Dr. Gwen Pounds: [phone]  - Dr. Roseanne Reno: [phone]  - Dr. Katrinka Blazing: [phone]   In the event of inclement weather, please call our main line at [phone] for an update on the status of any delays or closures.  Dermatology Medication Tips: Please keep the boxes that topical medications come in in order to help keep track of the instructions about where and how to use these. Pharmacies typically print the medication instructions only on the boxes and not directly on the medication tubes.   If your medication is too  expensive, please contact our office at [phone] option 4 or send [place] a message through MyChart.   We are unable to tell what your co-pay for medications will be in advance as this is different depending on your insurance coverage. However, we may be able to find a substitute medication at lower cost or fill out paperwork to get insurance to cover a needed medication.   If a prior authorization is required to get your medication covered by your insurance company, please allow [place] 1-2 business days to complete this process.  Drug prices often vary depending on where the prescription is filled and some pharmacies may offer cheaper prices.  The website www.goodrx.com contains coupons for medications through different pharmacies. The prices here do not account for what the cost may be with help from insurance (it may be cheaper with your insurance), but the website can give you the price if you did not use any insurance.  - You can print the associated coupon and take it with your prescription to the pharmacy.  - You may also stop by our office during regular business hours and pick up a GoodRx coupon card.  - If you need your prescription sent electronically to a different pharmacy, notify our office through [employer] or by phone at [phone] option 4.     Si Usted Necesita Algo Despus de Su Visita  Tambin puede enviarnos un mensaje a travs de Clinical cytogeneticist. Por lo general respondemos a los mensajes de MyChart en el transcurso de 1 a 2 das hbiles.  Para renovar recetas, por favor pida a su farmacia que se ponga en contacto con nuestra oficina. Annie Sable de fax es [place] [phone].  Si tiene un asunto urgente cuando la clnica est cerrada y que no puede esperar hasta el siguiente da hbil, puede llamar/localizar a su doctor(a) al nmero que aparece a continuacin.   Por favor, tenga en cuenta que aunque hacemos todo lo posible para estar disponibles para asuntos urgentes fuera  del horario de [place], no estamos disponibles las 24 horas del da, los 7 [address]  [address] de la [place].   Si tiene un problema urgente y no puede comunicarse con nosotros, puede optar por buscar atencin mdica  en el consultorio de su doctor(a), en una clnica privada, en un centro de atencin urgente o en una sala de emergencias.  Si tiene Engineer, drilling, por favor llame inmediatamente al 911 o vaya a la sala de emergencias.  Nmeros de bper  - Dr. Gwen Pounds: [phone]  - Dra. Roseanne Reno: [phone]  - Dr. Katrinka Blazing: [phone]   En caso de inclemencias del tiempo, por favor llame a Lacy Duverney principal al [phone] para una actualizacin sobre el [place] de cualquier retraso o cierre.  Consejos para la medicacin en dermatologa: Por favor, guarde las cajas en las que vienen los medicamentos de uso tpico para ayudarle a seguir las instrucciones sobre dnde y cmo usarlos. Las farmacias generalmente  imprimen las instrucciones del medicamento slo en las cajas y no directamente en los tubos del [place].   Si su medicamento es muy caro, por favor, pngase en contacto con Rolm Gala llamando al [phone] y presione la opcin 4 o envenos un mensaje a travs de Clinical cytogeneticist.   No podemos decirle cul ser su copago por los medicamentos por adelantado ya que esto es diferente dependiendo de la cobertura de su seguro. Sin embargo, es posible que podamos encontrar un medicamento sustituto a Audiological scientist un formulario para que el seguro cubra el medicamento que se considera necesario.   Si se requiere una autorizacin previa para que su compaa de seguros [place] su medicamento, por favor permtanos de 1 a 2 das hbiles para completar [address]  Los precios de los medicamentos varan con frecuencia dependiendo del Environmental consultant de dnde se surte la receta y alguna farmacias pueden ofrecer precios ms baratos.  El sitio web www.goodrx.com tiene cupones para medicamentos de  Health and safety inspector. Los precios aqu no tienen en cuenta lo que podra costar con la ayuda del seguro (puede ser ms barato con su seguro), pero el sitio web puede darle el precio si no utiliz Tourist information centre manager.  - Puede imprimir el cupn correspondiente y llevarlo con su receta a la farmacia.  - Tambin puede pasar por nuestra oficina durante el horario de atencin regular y Education officer, museum una tarjeta de cupones de GoodRx.  - Si necesita que su receta se enve electrnicamente a una farmacia diferente, informe a nuestra oficina a travs de MyChart de [place] o por telfono llamando al [phone] y presione la opcin 4.

## 2022-12-24 NOTE — Telephone Encounter (Signed)
Patient calling to let Dr Kirtland Bouchard know Triamcinolone cream is not helping with his Eczema itchy skin on his back and legs

## 2022-12-24 NOTE — Progress Notes (Signed)
   Follow-Up Visit   Subjective  Larry Mann is a 72 y.o. male who presents for the following: Patient here today concerning itchy areas at back and back of neck into scalp. Patient has been itchy since August. Was prescribed triamcinolone 1 pound jar of cream to use at affected areas one to two times daily. Patient reports cream did help for a while but itchy areas returned once he stopped using cream. States itchy areas are on back and back of neck.   The patient has spots, moles and lesions to be evaluated, some may be new or changing and the patient may have concern these could be cancer.   The following portions of the chart were reviewed this encounter and updated as appropriate: medications, allergies, medical history  Review of Systems:  No other skin or systemic complaints except as noted in HPI or Assessment and Plan.  Objective  Well appearing patient in no apparent distress; mood and affect are within normal limits.   A focused examination was performed of the following areas: Back, posterior scalp  Relevant exam findings are noted in the Assessment and Plan.             Assessment & Plan   ATOPIC DERMATITIS with Urticaria , Lichen Simplex Chronicus, Pruritus, and a possible Element of Tinea Versicolor  Exam: Scaly pink papules coalescing to plaques 12% BSA  Chronic and persistent condition with duration or expected duration over one year. Condition is symptomatic/ bothersome to patient. Not currently at goal.   Atopic dermatitis (eczema) is a chronic, relapsing, pruritic condition that can significantly affect quality of life. It is often associated with allergic rhinitis and/or asthma and can require treatment with topical medications, phototherapy, or in severe cases biologic injectable medication (Dupixent; Adbry) or Oral JAK inhibitors.  Treatment Plan:  Start Ketoconazole 2 % cream - apply topically to affected areas once daily (morning or  night)  Restart TMC 0.1 % cream - apply topically to affected areas once daily (morning or night)  Add antihistamine For Hives (urticaria); dermatographism or Itch: Start non sedating antihistamine (either Allegra 180mg , or Claritin 10mg , or Zyrtec 10mg ) daily.  All these are non-prescription ("Over the Counter").   Start out with 1 pill a day.   After a week if not improving may increase to 2 pills a day.   After another week if not improving may increase to 3 pills a day.   After another week if still not improving may take up to 4 pills a day. Stay at highest dose that keeps condition controlled, but only up to 4 pills a day. Stay at the controlling dose for at least 2 weeks. Contact office if taking 4 pills of antihistamine a day for at least 2 weeks without control of condition as other options may be available.  Will re-evaluate in 2 weeks. If not improved will consider bx. Patient deferred bx today.   Recommend gentle skin care.   Other atopic dermatitis  Related Medications ketoconazole (NIZORAL) 2 % cream Apply to itchy rash at back and back of neck once daily    Return for 2 week follow up on rash .  IAsher Muir, CMA, am acting as scribe for Armida Sans, MD.   Documentation: I have reviewed the above documentation for accuracy and completeness, and I agree with the above.  Armida Sans, MD

## 2022-12-31 ENCOUNTER — Encounter: Payer: Self-pay | Admitting: Dermatology

## 2023-01-08 ENCOUNTER — Encounter: Payer: Self-pay | Admitting: Dermatology

## 2023-01-08 ENCOUNTER — Ambulatory Visit: Payer: Medicare Other | Admitting: Dermatology

## 2023-01-08 DIAGNOSIS — L2089 Other atopic dermatitis: Secondary | ICD-10-CM

## 2023-01-08 DIAGNOSIS — T7840XA Allergy, unspecified, initial encounter: Secondary | ICD-10-CM

## 2023-01-08 DIAGNOSIS — Z7189 Other specified counseling: Secondary | ICD-10-CM

## 2023-01-08 DIAGNOSIS — Z79899 Other long term (current) drug therapy: Secondary | ICD-10-CM

## 2023-01-08 DIAGNOSIS — R21 Rash and other nonspecific skin eruption: Secondary | ICD-10-CM | POA: Diagnosis not present

## 2023-01-08 DIAGNOSIS — L509 Urticaria, unspecified: Secondary | ICD-10-CM

## 2023-01-08 DIAGNOSIS — L299 Pruritus, unspecified: Secondary | ICD-10-CM

## 2023-01-08 MED ORDER — CLOBETASOL PROPIONATE 0.05 % EX CREA: TOPICAL_CREAM | CUTANEOUS | 2 refills | Status: DC

## 2023-01-08 NOTE — Patient Instructions (Addendum)
Wound Care Instructions  Cleanse wound gently with soap and water once a day then pat dry with clean gauze. Apply a thin coat of Petrolatum (petroleum jelly, "Vaseline") over the wound (unless you have an allergy to this). We recommend that you use a new, sterile tube of Vaseline. Do not pick or remove scabs. Do not remove the yellow or white "healing tissue" from the base of the wound.  Cover the wound with fresh, clean, nonstick gauze and secure with paper tape. You may use Band-Aids in place of gauze and tape if the wound is small enough, but would recommend trimming much of the tape off as there is often too much. Sometimes Band-Aids can irritate the skin.  You should call the office for your biopsy report after 1 week if you have not already been contacted.  If you experience any problems, such as abnormal amounts of bleeding, swelling, significant bruising, significant pain, or evidence of infection, please call the office immediately.  FOR ADULT SURGERY PATIENTS: If you need something for pain relief you may take 1 extra strength Tylenol (acetaminophen) AND 2 Ibuprofen (200mg  each) together every 4 hours as needed for pain. (do not take these if you are allergic to them or if you have a reason you should not take them.) Typically, you may only need pain medication for 1 to 3 days.     Start Clobetasol cream twice daily to affected areas as needed for itching. Avoid applying to face, groin, and axilla. Use as directed. Long-term use can cause thinning of the skin.   Add antihistamine For Hives (urticaria); dermatographism or Itch: Start non sedating antihistamine (either Allegra 180mg , or Claritin 10mg , or Zyrtec 10mg ) daily.  All these are non-prescription ("Over the Counter").   Start out with 1 pill a day.   After a week if not improving may increase to 2 pills a day.   After another week if not improving may increase to 3 pills a day.   After another week if still not improving may  take up to 4 pills a day. Stay at highest dose that keeps condition controlled, but only up to 4 pills a day. Stay at the controlling dose for at least 2 weeks. Contact office if taking 4 pills of antihistamine a day for at least 2 weeks without control of condition as other options may be available.   Gentle Skin Care Guide  1. Bathe no more than once a day.  2. Avoid bathing in hot water  3. Use a mild soap like Dove, Vanicream, Cetaphil, CeraVe. Can use Lever 2000 or Cetaphil antibacterial soap  4. Use soap only where you need it. On most days, use it under your arms, between your legs, and on your feet. Let the water rinse other areas unless visibly dirty.  5. When you get out of the bath/shower, use a towel to gently blot your skin dry, don't rub it.  6. While your skin is still a little damp, apply a moisturizing cream such as Vanicream, CeraVe, Cetaphil, Eucerin, Sarna lotion or plain Vaseline Jelly. For hands apply Neutrogena [place] Hand Cream or Excipial Hand Cream.  7. Reapply moisturizer any time you start to itch or feel dry.  8. Sometimes using free and clear laundry detergents can be helpful. Fabric softener sheets should be avoided. Downy Free & Gentle liquid, or any liquid fabric softener that is free of dyes and perfumes, it acceptable to use  9. If your doctor has given you  prescription creams you may apply moisturizers over them       Due to recent changes in healthcare laws, you may see results of your pathology and/or laboratory studies on MyChart before the doctors have had a chance to review them. We understand that in some cases there may be results that are confusing or concerning to you. Please understand that not all results are received at the same time and often the doctors may need to interpret multiple results in order to provide you with the best plan of care or course of treatment. Therefore, we ask that you please give [place] 2 business days to  thoroughly review all your results before contacting the office for clarification. Should we see a critical lab result, you will be contacted sooner.   If You Need Anything After Your Visit  If you have any questions or concerns for your doctor, please call our main line at [phone] and press option 4 to reach your doctor's medical assistant. If no one answers, please leave a voicemail as directed and we will return your call as soon as possible. Messages left after 4 pm will be answered the following business day.   You may also send [place] a message via MyChart. We typically respond to MyChart messages within 1-2 business days.  For prescription refills, please ask your pharmacy to contact our office. Our fax number is [phone].  If you have an urgent issue when the clinic is closed that cannot wait until the next business day, you can page your doctor at the number below.    Please note that while we do our best to be available for urgent issues outside of office hours, we are not available 24/7.   If you have an urgent issue and are unable to reach [place], you may choose to seek medical care at your doctor's office, retail clinic, urgent care center, or emergency room.  If you have a medical emergency, please immediately call 911 or go to the emergency department.  Pager Numbers  - Dr. Gwen Pounds: [phone]  - Dr. Roseanne Reno: [phone]  - Dr. Katrinka Blazing: [phone]   In the event of inclement weather, please call our main line at [phone] for an update on the status of any delays or closures.  Dermatology Medication Tips: Please keep the boxes that topical medications come in in order to help keep track of the instructions about where and how to use these. Pharmacies typically print the medication instructions only on the boxes and not directly on the medication tubes.   If your medication is too expensive, please contact our office at [phone] option 4 or send [place] a message  through MyChart.   We are unable to tell what your co-pay for medications will be in advance as this is different depending on your insurance coverage. However, we may be able to find a substitute medication at lower cost or fill out paperwork to get insurance to cover a needed medication.   If a prior authorization is required to get your medication covered by your insurance company, please allow [place] 1-2 business days to complete this process.  Drug prices often vary depending on where the prescription is filled and some pharmacies may offer cheaper prices.  The website www.goodrx.com contains coupons for medications through different pharmacies. The prices here do not account for what the cost may be with help from insurance (it may be cheaper with your insurance), but the website can give you the price if you did  not use any insurance.  - You can print the associated coupon and take it with your prescription to the pharmacy.  - You may also stop by our office during regular business hours and pick up a GoodRx coupon card.  - If you need your prescription sent electronically to a different pharmacy, notify our office through [facility] or by phone at [phone] option 4.     Si Usted Necesita Algo Despus de Su Visita  Tambin puede enviarnos un mensaje a travs de Clinical cytogeneticist. Por lo general respondemos a los mensajes de MyChart en el transcurso de 1 a 2 das hbiles.  Para renovar recetas, por favor pida a su farmacia que se ponga en contacto con nuestra oficina. Annie Sable de fax es [place] [phone].  Si tiene un asunto urgente cuando la clnica est cerrada y que no puede esperar hasta el siguiente da hbil, puede llamar/localizar a su doctor(a) al nmero que aparece a continuacin.   Por favor, tenga en cuenta que aunque hacemos todo lo posible para estar disponibles para asuntos urgentes fuera del horario de [place], no estamos disponibles las 24 horas del da, los 7 [address]  [address] de  la [place].   Si tiene un problema urgente y no puede comunicarse con nosotros, puede optar por buscar atencin mdica  en el consultorio de su doctor(a), en una clnica privada, en un centro de atencin urgente o en una sala de emergencias.  Si tiene Engineer, drilling, por favor llame inmediatamente al 911 o vaya a la sala de emergencias.  Nmeros de bper  - Dr. Gwen Pounds: [phone]  - Dra. Roseanne Reno: [phone]  - Dr. Katrinka Blazing: [phone]   En caso de inclemencias del tiempo, por favor llame a Lacy Duverney principal al [phone] para una actualizacin sobre el [place] de cualquier retraso o cierre.  Consejos para la medicacin en dermatologa: Por favor, guarde las cajas en las que vienen los medicamentos de uso tpico para ayudarle a seguir las instrucciones sobre dnde y cmo usarlos. Las farmacias generalmente imprimen las instrucciones del medicamento slo en las cajas y no directamente en los tubos del [place].   Si su medicamento es muy caro, por favor, pngase en contacto con Rolm Gala llamando al [phone] y presione la opcin 4 o envenos un mensaje a travs de Clinical cytogeneticist.   No podemos decirle cul ser su copago por los medicamentos por adelantado ya que esto es diferente dependiendo de la cobertura de su seguro. Sin embargo, es posible que podamos encontrar un medicamento sustituto a Audiological scientist un formulario para que el seguro cubra el medicamento que se considera necesario.   Si se requiere una autorizacin previa para que su compaa de seguros [place] su medicamento, por favor permtanos de 1 a 2 das hbiles para completar [address]  Los precios de los medicamentos varan con frecuencia dependiendo del Environmental consultant de dnde se surte la receta y alguna farmacias pueden ofrecer precios ms baratos.  El sitio web www.goodrx.com tiene cupones para medicamentos de Health and safety inspector. Los precios aqu no tienen en cuenta lo que podra costar con la ayuda  del seguro (puede ser ms barato con su seguro), pero el sitio web puede darle el precio si no utiliz Tourist information centre manager.  - Puede imprimir el cupn correspondiente y llevarlo con su receta a la farmacia.  - Tambin puede pasar por nuestra oficina durante el horario de atencin regular y Education officer, museum una tarjeta de cupones de GoodRx.  - Si necesita que su receta se  enve electrnicamente a una farmacia diferente, informe a nuestra oficina a travs de MyChart de [place] o por telfono llamando al [phone] y presione la opcin 4.

## 2023-01-08 NOTE — Progress Notes (Signed)
Follow-Up Visit   Subjective  Larry Mann is a 72 y.o. male who presents for the following: Atopic Dermatitis. 2 week follow up. Using Ketoconazole and Triamcinolone creams as directed. Taking 2 oral antihistamines daily. Thinks is [employer] brand of Claritin but is not sure. Has not noticed any improvement over the past 2 weeks.     The following portions of the chart were reviewed this encounter and updated as appropriate: medications, allergies, medical history  Review of Systems:  No other skin or systemic complaints except as noted in HPI or Assessment and Plan.  Objective  Well appearing patient in no apparent distress; mood and affect are within normal limits.  Areas Examined: Head, neck, back  Relevant physical exam findings are noted in the Assessment and Plan.  Right Upper Back Erythematous papules coalescing to plaques         Assessment & Plan   Rash Right Upper Back  Skin / nail biopsy - Right Upper Back Type of biopsy: punch   Informed consent: discussed and consent obtained   Timeout: patient name, date of birth, surgical site, and procedure verified   Procedure prep:  Patient was prepped and draped in usual sterile fashion (the patient was cleaned and prepped) Prep type:  Isopropyl alcohol Anesthesia: the lesion was anesthetized in a standard fashion   Anesthetic:  1% lidocaine w/ epinephrine 1-100,000 buffered w/ 8.4% NaHCO3 Punch size:  3 mm Suture size:  4-0 Suture type: nylon   Suture removal (days):  7 Hemostasis achieved with: suture, pressure and aluminum chloride   Outcome: patient tolerated procedure well   Post-procedure details: sterile dressing applied and wound care instructions given   Dressing type: bandage, petrolatum and pressure dressing    Specimen 1 - Surgical pathology Differential Diagnosis: atopic dermatitis vs Urticaria vs Lichen Simplex Chronicus vs other  Check Margins: No  Other atopic dermatitis  Related  Medications ketoconazole (NIZORAL) 2 % cream Apply to itchy rash at back and back of neck once daily     ATOPIC DERMATITIS With urticaria and pruritus Exam: Scaly pink papules coalescing to plaques 12% BSA  Chronic and persistent condition with duration or expected duration over one year. Condition is symptomatic/ bothersome to patient. Not currently at goal.   Atopic dermatitis (eczema) is a chronic, relapsing, pruritic condition that can significantly affect quality of life. It is often associated with allergic rhinitis and/or asthma and can require treatment with topical medications, phototherapy, or in severe cases biologic injectable medication (Dupixent; Adbry) or Oral JAK inhibitors.  Treatment Plan:  Start Clobetasol cream twice daily to affected areas as needed for itching. Avoid applying to face, groin, and axilla. Use as directed. Long-term use can cause thinning of the skin.  Add antihistamine For Hives (urticaria); dermatographism or Itch: Start non sedating antihistamine (either Allegra 180mg , or Claritin 10mg , or Zyrtec 10mg ) daily.  All these are non-prescription ("Over the Counter").   Start out with 1 pill a day.   After a week if not improving may increase to 2 pills a day.   After another week if not improving may increase to 3 pills a day.   After another week if still not improving may take up to 4 pills a day. Stay at highest dose that keeps condition controlled, but only up to 4 pills a day. Stay at the controlling dose for at least 2 weeks. Contact office if taking 4 pills of antihistamine a day for at least 2 weeks without control  of condition as other options may be available.  Recommend gentle skin care.   Return in about 1 week (around 01/15/2023) for Suture Removal.  I, Lawson Radar, CMA, am acting as scribe for Armida Sans, MD.   Documentation: I have reviewed the above documentation for accuracy and completeness, and I agree with the  above.  Armida Sans, MD

## 2023-01-14 LAB — SURGICAL PATHOLOGY

## 2023-01-15 ENCOUNTER — Ambulatory Visit (INDEPENDENT_AMBULATORY_CARE_PROVIDER_SITE_OTHER): Payer: Medicare Other | Admitting: Dermatology

## 2023-01-15 DIAGNOSIS — L239 Allergic contact dermatitis, unspecified cause: Secondary | ICD-10-CM

## 2023-01-15 DIAGNOSIS — Z7189 Other specified counseling: Secondary | ICD-10-CM

## 2023-01-15 DIAGNOSIS — Z79899 Other long term (current) drug therapy: Secondary | ICD-10-CM | POA: Diagnosis not present

## 2023-01-15 DIAGNOSIS — R21 Rash and other nonspecific skin eruption: Secondary | ICD-10-CM

## 2023-01-15 NOTE — Progress Notes (Signed)
   Follow Up Visit   Subjective  Larry Mann is a 72 y.o. male who presents for the following: 1 week follow-up biopsy of the right upper back, dermal hypersensitivity reaction. Patient started clobetasol cream last week and is taking Claritin 2 po every day. He has   The following portions of the chart were reviewed this encounter and updated as appropriate: medications, allergies, medical history  Review of Systems:  No other skin or systemic complaints except as noted in HPI or Assessment and Plan.  Objective  Well appearing patient in no apparent distress; mood and affect are within normal limits.  A focused examination was performed of the following areas: Face, trunk  Relevant exam findings are noted in the Assessment and Plan.       Assessment & Plan   Rash Biopsy proven Dermal Hypersensitivity Reaction. Possibly from medication (or food) or less likely bite reaction.  Improving on Clobetasol treatment Suture removed from right upper back.  Treatment Plan: Continue Clobetasol Cream BID x 1 week, then decrease to 5d/wk Cont Claritin x 2 more weeks  Recommend gentle skin care.  Return as scheduled.  Wendee Beavers, CMA, am acting as scribe for Armida Sans, MD .   Documentation: I have reviewed the above documentation for accuracy and completeness, and I agree with the above.  Armida Sans, MD

## 2023-01-15 NOTE — Patient Instructions (Addendum)
Continue Clobetasol Cream Apply to affected areas daily for 1 more week, then decrease to 5 days a week until improved.  Topical steroids (such as triamcinolone, fluocinolone, fluocinonide, mometasone, clobetasol, halobetasol, betamethasone, hydrocortisone) can cause thinning and lightening of the skin if they are used for too long in the same area. Your physician has selected the right strength medicine for your problem and area affected on the body. Please use your medication only as directed by your physician to prevent side effects.    Due to recent changes in healthcare laws, you may see results of your pathology and/or laboratory studies on MyChart before the doctors have had a chance to review them. We understand that in some cases there may be results that are confusing or concerning to you. Please understand that not all results are received at the same time and often the doctors may need to interpret multiple results in order to provide you with the best plan of care or course of treatment. Therefore, we ask that you please give [place] 2 business days to thoroughly review all your results before contacting the office for clarification. Should we see a critical lab result, you will be contacted sooner.   If You Need Anything After Your Visit  If you have any questions or concerns for your doctor, please call our main line at [phone] and press option 4 to reach your doctor's medical assistant. If no one answers, please leave a voicemail as directed and we will return your call as soon as possible. Messages left after 4 pm will be answered the following business day.   You may also send [place] a message via MyChart. We typically respond to MyChart messages within 1-2 business days.  For prescription refills, please ask your pharmacy to contact our office. Our fax number is [phone].  If you have an urgent issue when the clinic is closed that cannot wait until the next business day, you can  page your doctor at the number below.    Please note that while we do our best to be available for urgent issues outside of office hours, we are not available 24/7.   If you have an urgent issue and are unable to reach [place], you may choose to seek medical care at your doctor's office, retail clinic, urgent care center, or emergency room.  If you have a medical emergency, please immediately call 911 or go to the emergency department.  Pager Numbers  - Dr. Gwen Pounds: [phone]  - Dr. Roseanne Reno: [phone]  - Dr. Katrinka Blazing: [phone]   In the event of inclement weather, please call our main line at [phone] for an update on the status of any delays or closures.  Dermatology Medication Tips: Please keep the boxes that topical medications come in in order to help keep track of the instructions about where and how to use these. Pharmacies typically print the medication instructions only on the boxes and not directly on the medication tubes.   If your medication is too expensive, please contact our office at [phone] option 4 or send [place] a message through MyChart.   We are unable to tell what your co-pay for medications will be in advance as this is different depending on your insurance coverage. However, we may be able to find a substitute medication at lower cost or fill out paperwork to get insurance to cover a needed medication.   If a prior authorization is required to get your medication covered by your insurance company, please allow  [place] 1-2 business days to complete this process.  Drug prices often vary depending on where the prescription is filled and some pharmacies may offer cheaper prices.  The website www.goodrx.com contains coupons for medications through different pharmacies. The prices here do not account for what the cost may be with help from insurance (it may be cheaper with your insurance), but the website can give you the price if you did not use any insurance.  - You  can print the associated coupon and take it with your prescription to the pharmacy.  - You may also stop by our office during regular business hours and pick up a GoodRx coupon card.  - If you need your prescription sent electronically to a different pharmacy, notify our office through [facility] or by phone at [phone] option 4.     Si Usted Necesita Algo Despus de Su Visita  Tambin puede enviarnos un mensaje a travs de Clinical cytogeneticist. Por lo general respondemos a los mensajes de MyChart en el transcurso de 1 a 2 das hbiles.  Para renovar recetas, por favor pida a su farmacia que se ponga en contacto con nuestra oficina. Annie Sable de fax es [place] [phone].  Si tiene un asunto urgente cuando la clnica est cerrada y que no puede esperar hasta el siguiente da hbil, puede llamar/localizar a su doctor(a) al nmero que aparece a continuacin.   Por favor, tenga en cuenta que aunque hacemos todo lo posible para estar disponibles para asuntos urgentes fuera del horario de [place], no estamos disponibles las 24 horas del da, los 7 [address]  [address] de la [place].   Si tiene un problema urgente y no puede comunicarse con nosotros, puede optar por buscar atencin mdica  en el consultorio de su doctor(a), en una clnica privada, en un centro de atencin urgente o en una sala de emergencias.  Si tiene Engineer, drilling, por favor llame inmediatamente al 911 o vaya a la sala de emergencias.  Nmeros de bper  - Dr. Gwen Pounds: [phone]  - Dra. Roseanne Reno: [phone]  - Dr. Katrinka Blazing: [phone]   En caso de inclemencias del tiempo, por favor llame a Lacy Duverney principal al [phone] para una actualizacin sobre el [place] de cualquier retraso o cierre.  Consejos para la medicacin en dermatologa: Por favor, guarde las cajas en las que vienen los medicamentos de uso tpico para ayudarle a seguir las instrucciones sobre dnde y cmo usarlos. Las farmacias generalmente imprimen las  instrucciones del medicamento slo en las cajas y no directamente en los tubos del [place].   Si su medicamento es muy caro, por favor, pngase en contacto con Rolm Gala llamando al [phone] y presione la opcin 4 o envenos un mensaje a travs de Clinical cytogeneticist.   No podemos decirle cul ser su copago por los medicamentos por adelantado ya que esto es diferente dependiendo de la cobertura de su seguro. Sin embargo, es posible que podamos encontrar un medicamento sustituto a Audiological scientist un formulario para que el seguro cubra el medicamento que se considera necesario.   Si se requiere una autorizacin previa para que su compaa de seguros [place] su medicamento, por favor permtanos de 1 a 2 das hbiles para completar [address]  Los precios de los medicamentos varan con frecuencia dependiendo del Environmental consultant de dnde se surte la receta y alguna farmacias pueden ofrecer precios ms baratos.  El sitio web www.goodrx.com tiene cupones para medicamentos de Health and safety inspector. Los precios aqu no tienen en cuenta lo que  podra costar con la ayuda del seguro (puede ser ms barato con su seguro), pero el sitio web puede darle el precio si no Visual merchandiser.  - Puede imprimir el cupn correspondiente y llevarlo con su receta a la farmacia.  - Tambin puede pasar por nuestra oficina durante el horario de atencin regular y Education officer, museum una tarjeta de cupones de GoodRx.  - Si necesita que su receta se enve electrnicamente a una farmacia diferente, informe a nuestra oficina a travs de MyChart de [place] o por telfono llamando al [phone] y presione la opcin 4.

## 2023-01-21 ENCOUNTER — Encounter: Payer: Self-pay | Admitting: Dermatology

## 2023-07-26 ENCOUNTER — Emergency Department
Admission: EM | Admit: 2023-07-26 | Discharge: 2023-07-26 | Disposition: A | Discharge status: Home / Self Care | Attending: Emergency Medicine | Admitting: Emergency Medicine

## 2023-07-26 ENCOUNTER — Emergency Department

## 2023-07-26 ENCOUNTER — Other Ambulatory Visit: Payer: Self-pay

## 2023-07-26 DIAGNOSIS — N179 Acute kidney failure, unspecified: Secondary | ICD-10-CM | POA: Diagnosis not present

## 2023-07-26 DIAGNOSIS — I2510 Atherosclerotic heart disease of native coronary artery without angina pectoris: Secondary | ICD-10-CM | POA: Insufficient documentation

## 2023-07-26 DIAGNOSIS — N189 Chronic kidney disease, unspecified: Secondary | ICD-10-CM | POA: Diagnosis not present

## 2023-07-26 DIAGNOSIS — E162 Hypoglycemia, unspecified: Secondary | ICD-10-CM | POA: Diagnosis present

## 2023-07-26 DIAGNOSIS — Z794 Long term (current) use of insulin: Secondary | ICD-10-CM | POA: Diagnosis not present

## 2023-07-26 DIAGNOSIS — E11649 Type 2 diabetes mellitus with hypoglycemia without coma: Secondary | ICD-10-CM | POA: Insufficient documentation

## 2023-07-26 DIAGNOSIS — I130 Hypertensive heart and chronic kidney disease with heart failure and stage 1 through stage 4 chronic kidney disease, or unspecified chronic kidney disease: Secondary | ICD-10-CM | POA: Insufficient documentation

## 2023-07-26 DIAGNOSIS — K429 Umbilical hernia without obstruction or gangrene: Secondary | ICD-10-CM | POA: Insufficient documentation

## 2023-07-26 DIAGNOSIS — Z7901 Long term (current) use of anticoagulants: Secondary | ICD-10-CM | POA: Insufficient documentation

## 2023-07-26 DIAGNOSIS — I509 Heart failure, unspecified: Secondary | ICD-10-CM | POA: Insufficient documentation

## 2023-07-26 DIAGNOSIS — E1159 Type 2 diabetes mellitus with other circulatory complications: Secondary | ICD-10-CM | POA: Insufficient documentation

## 2023-07-26 DIAGNOSIS — E1122 Type 2 diabetes mellitus with diabetic chronic kidney disease: Secondary | ICD-10-CM | POA: Diagnosis not present

## 2023-07-26 LAB — CBC WITH DIFFERENTIAL/PLATELET
Abs Immature Granulocytes: 0.04 10*3/uL (ref 0.00–0.07)
Basophils Absolute: 0 10*3/uL (ref 0.0–0.1)
Basophils Relative: 1 %
Eosinophils Absolute: 0.1 10*3/uL (ref 0.0–0.5)
Eosinophils Relative: 2 %
HCT: 36.1 % — ABNORMAL LOW (ref 39.0–52.0)
Hemoglobin: 11.5 g/dL — ABNORMAL LOW (ref 13.0–17.0)
Immature Granulocytes: 1 %
Lymphocytes Relative: 5 %
Lymphs Abs: 0.3 10*3/uL — ABNORMAL LOW (ref 0.7–4.0)
MCH: 30.3 pg (ref 26.0–34.0)
MCHC: 31.9 g/dL (ref 30.0–36.0)
MCV: 95.3 fL (ref 80.0–100.0)
Monocytes Absolute: 0.4 10*3/uL (ref 0.1–1.0)
Monocytes Relative: 7 %
Neutro Abs: 5.2 10*3/uL (ref 1.7–7.7)
Neutrophils Relative %: 84 %
Platelets: 196 10*3/uL (ref 150–400)
RBC: 3.79 MIL/uL — ABNORMAL LOW (ref 4.22–5.81)
RDW: 15.9 % — ABNORMAL HIGH (ref 11.5–15.5)
WBC: 6.1 10*3/uL (ref 4.0–10.5)
nRBC: 0 % (ref 0.0–0.2)

## 2023-07-26 LAB — COMPREHENSIVE METABOLIC PANEL WITH GFR
ALT: 22 U/L (ref 0–44)
AST: 31 U/L (ref 15–41)
Albumin: 3.3 g/dL — ABNORMAL LOW (ref 3.5–5.0)
Alkaline Phosphatase: 77 U/L (ref 38–126)
Anion gap: 6 (ref 5–15)
BUN: 44 mg/dL — ABNORMAL HIGH (ref 8–23)
CO2: 26 mmol/L (ref 22–32)
Calcium: 8.3 mg/dL — ABNORMAL LOW (ref 8.9–10.3)
Chloride: 102 mmol/L (ref 98–111)
Creatinine, Ser: 3.99 mg/dL — ABNORMAL HIGH (ref 0.61–1.24)
GFR, Estimated: 15 mL/min — ABNORMAL LOW (ref >60)
Glucose, Bld: 60 mg/dL — ABNORMAL LOW (ref 70–99)
Potassium: 3.1 mmol/L — ABNORMAL LOW (ref 3.5–5.1)
Sodium: 134 mmol/L — ABNORMAL LOW (ref 135–145)
Total Bilirubin: 0.6 mg/dL (ref 0.0–1.2)
Total Protein: 7.6 g/dL (ref 6.5–8.1)

## 2023-07-26 LAB — CBG MONITORING, ED
Glucose-Capillary: 54 mg/dL — ABNORMAL LOW (ref 70–99)
Glucose-Capillary: 80 mg/dL (ref 70–99)
Glucose-Capillary: 78 mg/dL (ref 70–99)

## 2023-07-26 LAB — TROPONIN I (HIGH SENSITIVITY)
Troponin I (High Sensitivity): 51 ng/L — ABNORMAL HIGH (ref <18)
Troponin I (High Sensitivity): 47 ng/L — ABNORMAL HIGH (ref <18)

## 2023-07-26 MED ORDER — POTASSIUM CHLORIDE CRYS ER 20 MEQ PO TBCR
40.0000 meq | EXTENDED_RELEASE_TABLET | Freq: Once | ORAL | Status: AC
  Administered 2023-07-26: 40 meq via ORAL
  Filled 2023-07-26: qty 2

## 2023-07-26 MED ORDER — OXYCODONE HCL 5 MG PO TABS
10.0000 mg | ORAL_TABLET | Freq: Once | ORAL | Status: AC
  Administered 2023-07-26: 10 mg via ORAL
  Filled 2023-07-26: qty 2

## 2023-07-26 NOTE — ED Provider Notes (Signed)
 [facility] Provider Note    Event Date/Time   First MD Initiated Contact with Patient 07/26/23 [phone]     (approximate)   History   Chief Complaint Hypoglycemia   HPI  Larry Mann is a 73 y.o. male with past medical history of hypertension, diabetes, CAD, CHF, atrial fibrillation on Eliquis , cirrhosis, CKD, and bladder cancer who presents to the ED complaining of hypoglycemia.  Per EMS, patient was not feeling well this morning with slurred speech noted by his spouse.  On EMS arrival, patient found to have CBG of 40, given some juice, peanut butter, and a small amount of D10 with improvement.  Patient states that he essentially feels back to normal at this time, is not sure what he takes for his diabetes but does not take insulin .  He denies any fevers, cough, chest pain, shortness of breath, nausea, vomiting, or diarrhea.  He reports eating dinner last night but has not had anything to eat yet this morning.     Physical Exam   Triage Vital Signs: ED Triage Vitals [07/26/23 0838]  Encounter Vitals Group     BP (!) 133/93     Systolic BP Percentile      Diastolic BP Percentile      Pulse Rate 88     Resp 16     Temp      Temp src      SpO2 91 %     Weight      Height      Head Circumference      Peak Flow      Pain Score      Pain Loc      Pain Education      Exclude from Growth Chart     Most recent vital signs: Vitals:   07/26/23 0900 07/26/23 0930  BP: (!) 143/80 (!) 145/79  Pulse: 81 78  Resp: 14 20  Temp:    SpO2: 93% 95%    Constitutional: Alert and oriented. Eyes: Conjunctivae are normal. Head: Atraumatic. Nose: No congestion/rhinnorhea. Mouth/Throat: Mucous membranes are moist.  Cardiovascular: Normal rate, regular rhythm. Grossly normal heart sounds.  2+ radial pulses bilaterally. Respiratory: Normal respiratory effort.  No retractions. Lungs CTAB. Gastrointestinal: Soft and nontender.  Easily reducible periumbilical  hernia without tenderness.  No distention.  Urostomy bag in place. Musculoskeletal: No lower extremity tenderness nor edema.  Neurologic:  Normal speech and language. No gross focal neurologic deficits are appreciated.    ED Results / Procedures / Treatments   Labs (all labs ordered are listed, but only abnormal results are displayed) Labs Reviewed  CBC WITH DIFFERENTIAL/PLATELET - Abnormal; Notable for the following components:      Result Value   RBC 3.79 (*)    Hemoglobin 11.5 (*)    HCT 36.1 (*)    RDW 15.9 (*)    Lymphs Abs 0.3 (*)    All other components within normal limits  COMPREHENSIVE METABOLIC PANEL WITH GFR - Abnormal; Notable for the following components:   Sodium 134 (*)    Potassium 3.1 (*)    Glucose, Bld 60 (*)    BUN 44 (*)    Creatinine, Ser 3.99 (*)    Calcium  8.3 (*)    Albumin 3.3 (*)    GFR, Estimated 15 (*)    All other components within normal limits  CBG MONITORING, ED - Abnormal; Notable for the following components:   Glucose-Capillary 54 (*)  All other components within normal limits  TROPONIN I (HIGH SENSITIVITY) - Abnormal; Notable for the following components:   Troponin I (High Sensitivity) 51 (*)    All other components within normal limits  TROPONIN I (HIGH SENSITIVITY) - Abnormal; Notable for the following components:   Troponin I (High Sensitivity) 47 (*)    All other components within normal limits  CBG MONITORING, ED  CBG MONITORING, ED     EKG  ED ECG REPORT I, Twilla Galea, the attending physician, personally viewed and interpreted this ECG.   Date: 07/26/2023  EKG Time: 8:40  Rate: 85  Rhythm: normal sinus rhythm, isolated PVC  Axis: Normal  Intervals:left bundle branch block  ST&T Change: None  RADIOLOGY Chest x-ray reviewed and interpreted by me with no infiltrate, edema, or effusion.  PROCEDURES:  Critical Care performed: No  Procedures   MEDICATIONS ORDERED IN ED: Medications  oxyCODONE (Oxy  IR/ROXICODONE) immediate release tablet 10 mg (10 mg Oral Given 07/26/23 0927)  potassium chloride  SA (KLOR-CON  M) CR tablet 40 mEq (40 mEq Oral Given 07/26/23 0958)     IMPRESSION / MDM / ASSESSMENT AND PLAN / ED COURSE  I reviewed the triage vital signs and the nursing notes.                              73 y.o. male with past medical history of hypertension, diabetes, CAD, CHF, atrial fibrillation on Eliquis , bladder cancer, CKD, and cirrhosis who presents to the ED complaining of hypoglycemia noted this morning after he developed slurred speech.  Patient's presentation is most consistent with acute presentation with potential threat to life or bodily function.  Differential diagnosis includes, but is not limited to, hypoglycemia, AKI, electrolyte abnormality, decreased oral intake, accidental overdose, sepsis, UTI, pneumonia.  Patient nontoxic-appearing and in no acute distress, vital signs are unremarkable.  Recheck of his CBG is at 23, we will give patient additional juice and crackers to eat for now, however he takes glipizide and may benefit from D10 drip if he continues to drop CBG.  No findings concerning for sepsis, patient does have borderline low oxygen saturations at 91% on room air, but denies any fevers, cough, or difficulty breathing.  We will screen chest x-ray and labs, EKG without evidence of arrhythmia or ischemia.  Chest x-ray is unremarkable, oxygen saturations are now 95 to 98% on room air and patient continues to deny difficulty breathing.  Labs show mild AKI with mild hypokalemia, LFTs are unremarkable.  No significant anemia or leukocytosis noted, CBG improved on multiple rechecks.  Patient's wife is at bedside and reports poor oral intake recently, likely due to recurrent bladder cancer.  No evidence of infection at this time but due to patient being on a sulfonylurea, I recommended he be admitted to the hospital for observation, however he declines.  He was recently seen  by palliative care for recurrent cancer, has opted against treatment for this and would like to avoid admission to the hospital.  He understands risks of recurrent hypoglycemia, was counseled to regularly check his glucose and to eat regular meals.  He was counseled to stop his glipizide until able to follow-up with PCP, otherwise return to the ED for new or worsening symptoms.  Patient and wife agree with plan.      FINAL CLINICAL IMPRESSION(S) / ED DIAGNOSES   Final diagnoses:  Hypoglycemia  AKI (acute kidney injury) (HCC)  Rx / DC Orders   ED Discharge Orders     None        Note:  This document was prepared using Dragon voice recognition software and may include unintentional dictation errors.   Twilla Galea, MD 07/26/23 1214

## 2023-07-26 NOTE — ED Notes (Signed)
 Pt sating low on finger pulse ox at 90%-91%. Pt switched to pulse ox on the ear pt sating at 97% on RA.

## 2023-07-26 NOTE — ED Notes (Signed)
 Pt ambulatory to the bathroom. Stand by assist provided. Gait steady.

## 2023-07-26 NOTE — ED Triage Notes (Signed)
 Pt in via [facility] for hypoglycemia from home. Pt was not feeling well this morning with slurred speech noticed by family. EMS had a BS of 40. Pt given D10 after some juice and peanut butter. Pts current BS is 54. MD at bedside. Pt given juice and peanut butter. Hx of DM. Does not take insulin .

## 2023-07-26 NOTE — Discharge Instructions (Signed)
 Your blood sugar today was critically low, likely due to your decreased appetite recently.  You should stop taking your glipizide until you are able to follow-up with your primary care doctor for reassessment and potential medication change.  In the meantime, please make sure that you are eating 3 meals a day with regular snacks and return to the emergency room for any new or worsening symptoms.

## 2023-08-09 IMAGING — US US ABDOMEN LIMITED
1 series · 14 of 25 positions shown · non-contrast
Comparison: 11/05/2020 CT, ultrasound from 04/05/2020

CLINICAL DATA: History of cirrhosis

EXAM:
ULTRASOUND ABDOMEN LIMITED RIGHT UPPER QUADRANT

[Series 1: us abdomen limited ruq (liver/gb) · 56 acquisitions, 14 frames shown]
[im 1/56]
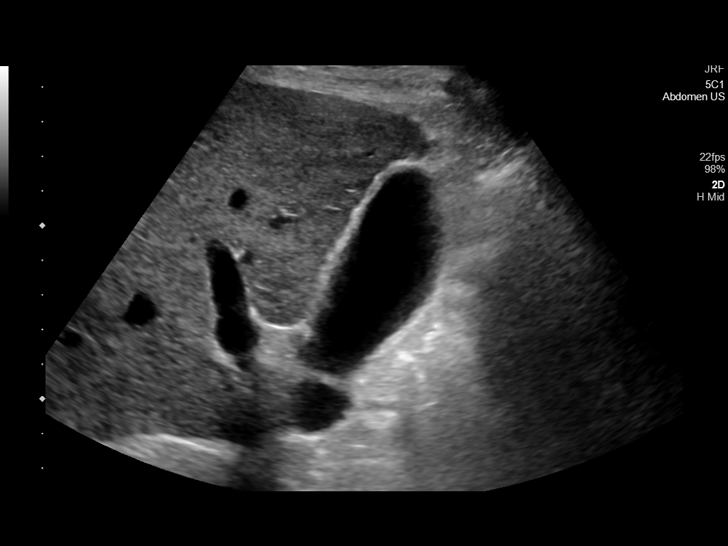
[im 5/56]
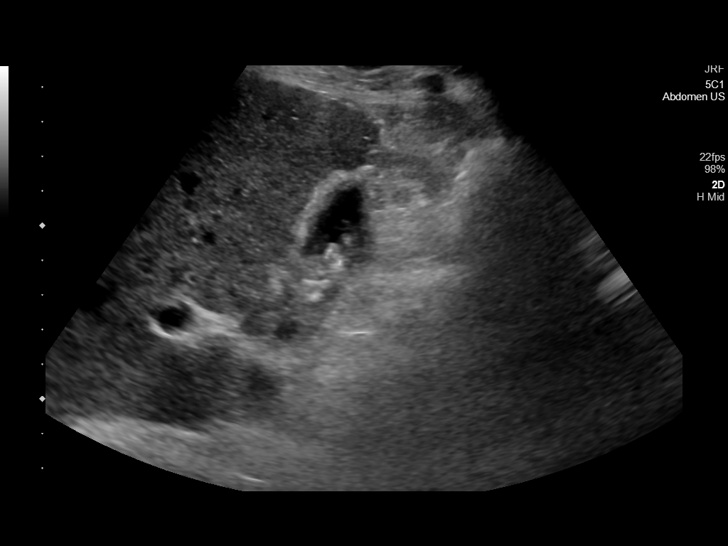
[im 10/56]
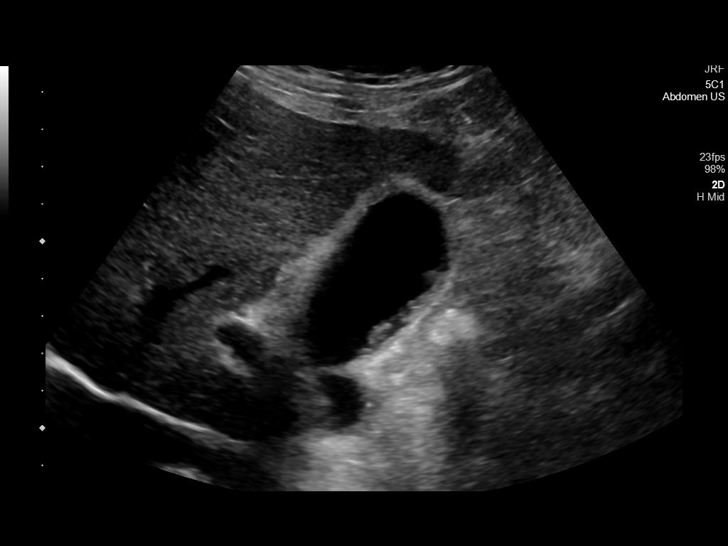
[im 14/56]
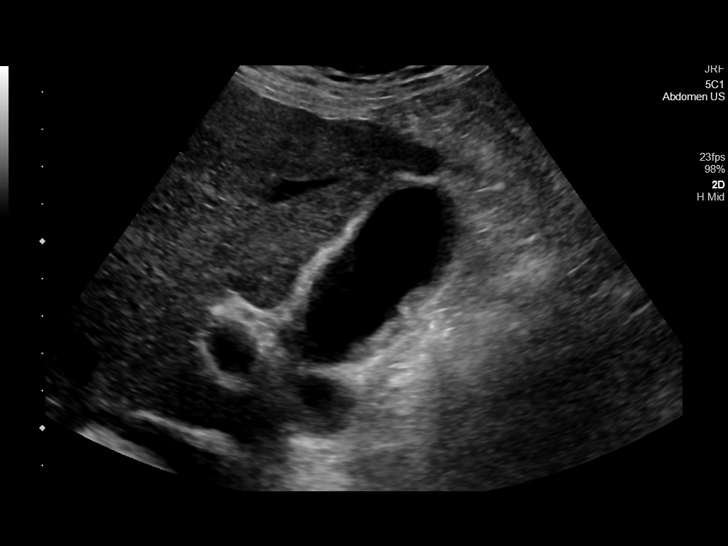
[im 19/56]
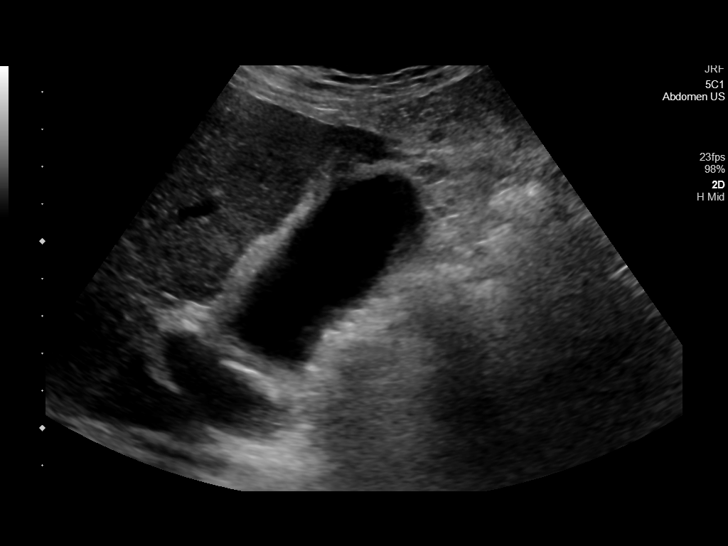
[im 21/56]
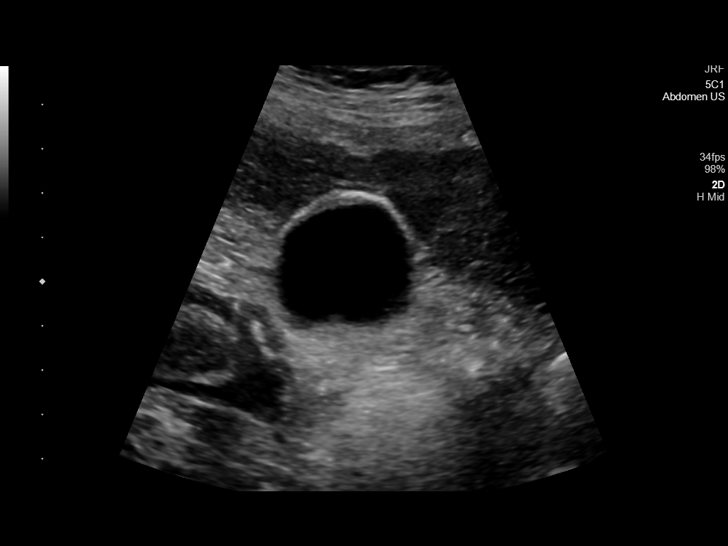
[im 26/56]
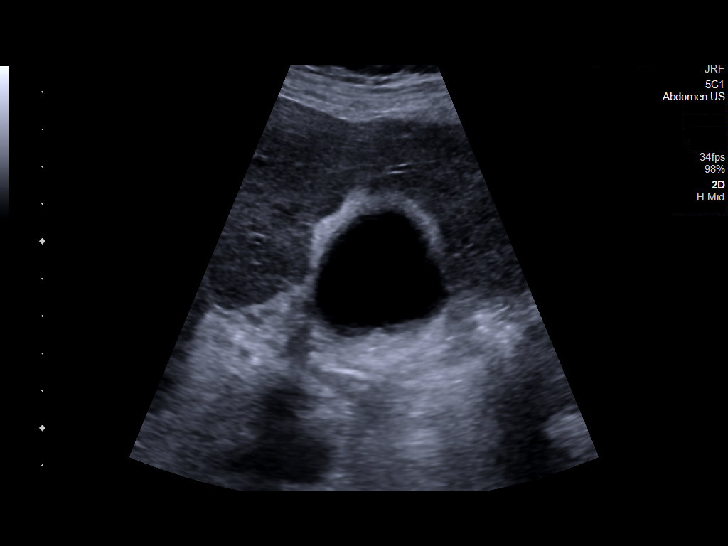
[im 30/56]
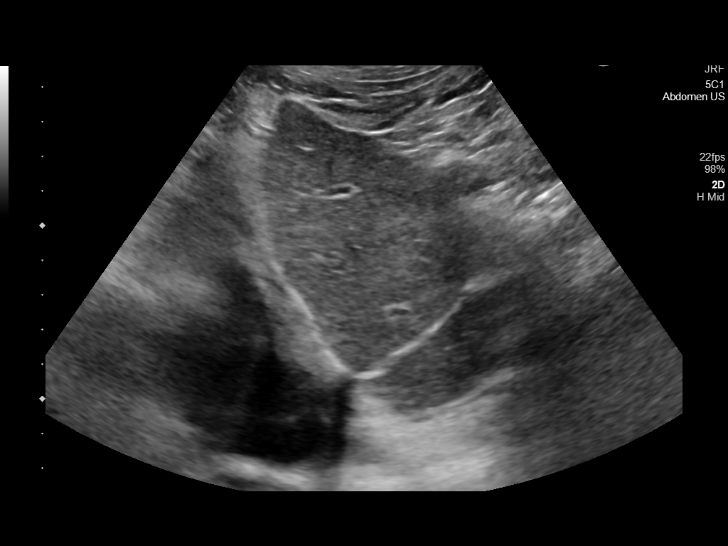
[im 35/56]
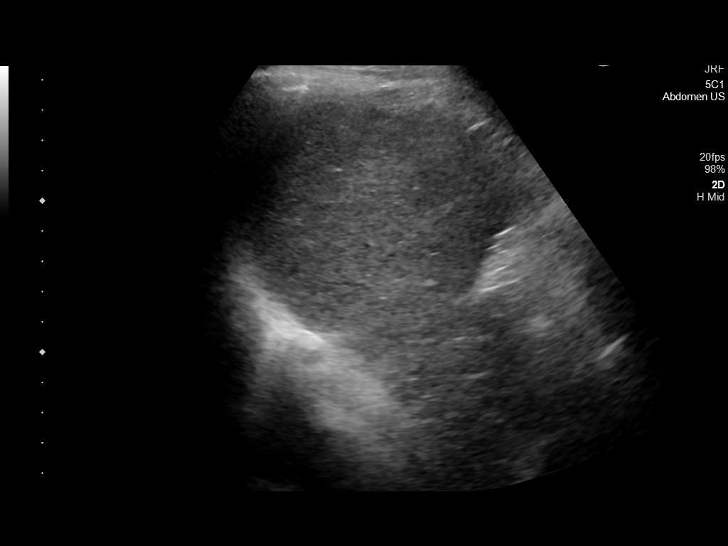
[im 37/56]
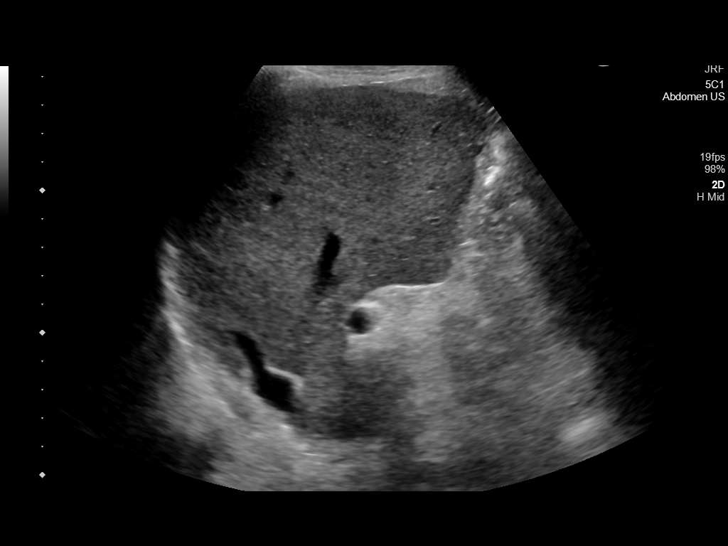
[im 42/56]
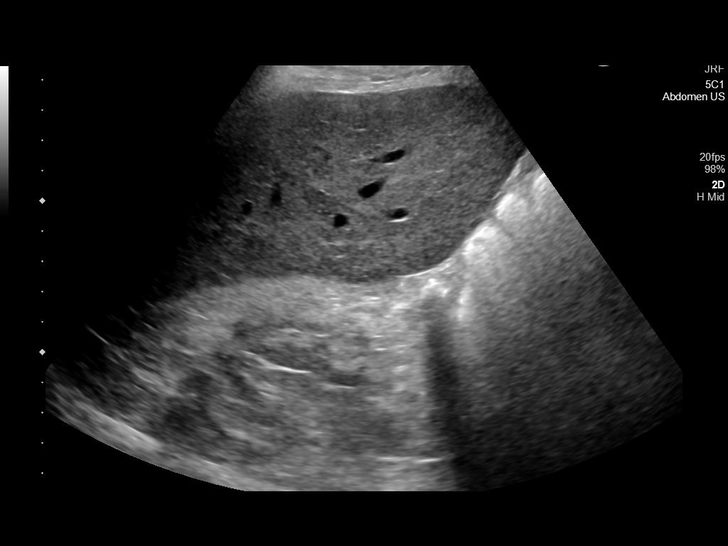
[im 46/56]
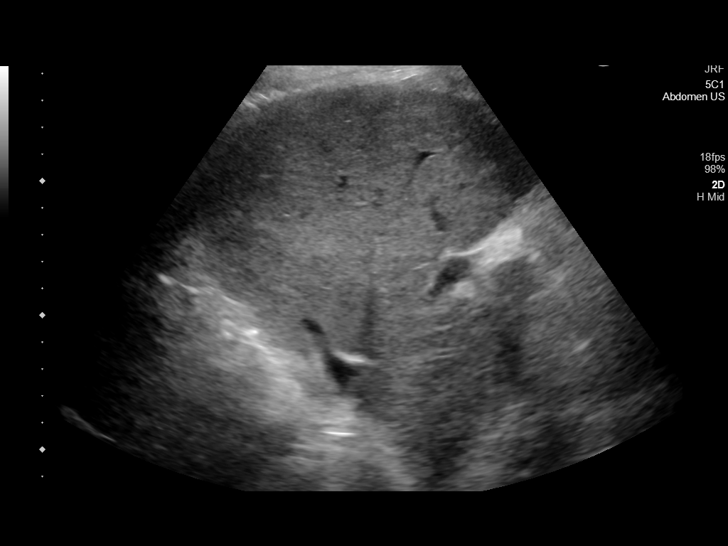
[im 51/56]
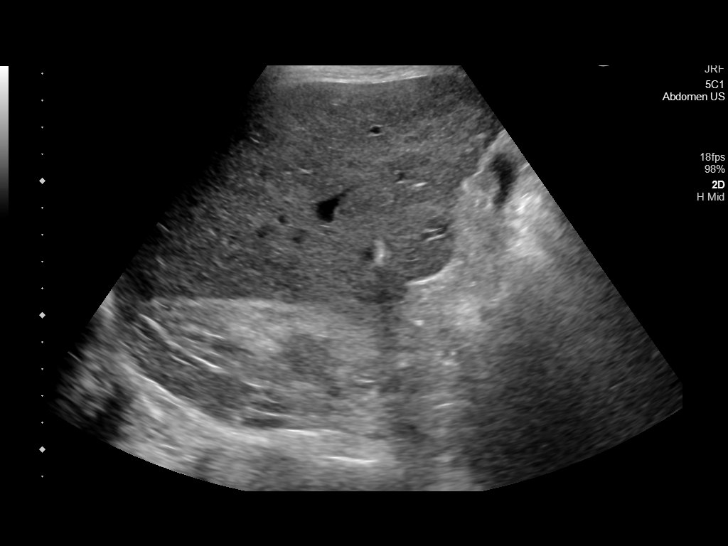
[im 56/56]
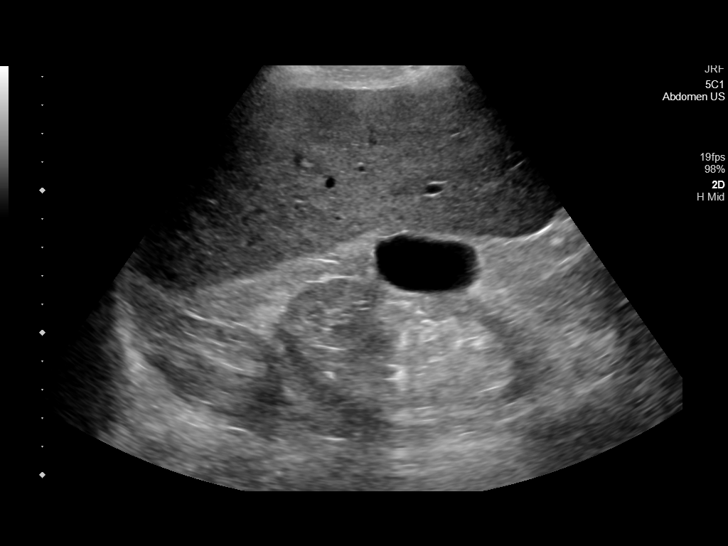

[14 of 25 positions shown; findings below may reference images not displayed]

FINDINGS: Gallbladder:

Gallbladder is well distended with evidence of gallbladder sludge
and cholelithiasis. Negative sonographic Murphy's sign is elicited.
A focal area of thickening in the gallbladder wall is noted near the
fundus which may have been present on prior ultrasound. This was not
well appreciated on the recent CT examination.

Common bile duct:

Diameter: 1.7 mm.

Liver:

Increased in echogenicity without focal mass. Portal vein is patent
on color Doppler imaging with normal direction of blood flow towards
the liver.

Other: None.
IMPRESSION: Cholelithiasis.

Increased echogenicity within the liver likely related to the
underlying cirrhosis.

Focal area of thickening within the gallbladder not well appreciated
on prior CT but may have been present on the prior ultrasound. This
is of uncertain etiology. MRI of the abdomen may be helpful for
further evaluation.

## 2023-08-28 ENCOUNTER — Ambulatory Visit: Payer: PRIVATE HEALTH INSURANCE | Admitting: Dermatology

## 2023-09-24 ENCOUNTER — Ambulatory Visit: Payer: PRIVATE HEALTH INSURANCE | Admitting: Dermatology

## 2023-10-11 ENCOUNTER — Ambulatory Visit
Admission: EM | Admit: 2023-10-11 | Discharge: 2023-10-11 | Disposition: A | Discharge status: Home / Self Care | Attending: Emergency Medicine | Admitting: Emergency Medicine

## 2023-10-11 DIAGNOSIS — R8290 Unspecified abnormal findings in urine: Secondary | ICD-10-CM | POA: Insufficient documentation

## 2023-10-11 LAB — POCT URINE DIPSTICK
Bilirubin, UA: NEGATIVE
Glucose, UA: NEGATIVE mg/dL
Ketones, POC UA: NEGATIVE mg/dL
Nitrite, UA: NEGATIVE
Protein Ur, POC: 100 mg/dL — AB
Spec Grav, UA: 1.01 (ref 1.010–1.025)
Urobilinogen, UA: 0.2 U/dL
pH, UA: >=9 — AB (ref 5.0–8.0)

## 2023-10-11 MED ORDER — CEPHALEXIN 500 MG PO CAPS: 500.0000 mg | ORAL_CAPSULE | Freq: Four times a day (QID) | ORAL | 0 refills | Status: DC

## 2023-10-11 NOTE — ED Provider Notes (Signed)
 [facility]    CSN: [id number] Arrival date & time: 10/11/23  9188      History   Chief Complaint Chief Complaint  Patient presents with   Altered Mental Status    HPI Larry Mann is a 73 y.o. male.   Patient presents for evaluation of intermittent confusion and a foul urinary odor to his urostomy present for 2 days.  Denies drainage or leaking from the urostomy bag.  Denies abdominal or flank pain, fever.  Has hematuria at baseline.  History of bladder cancer and CKD.  Past Medical History:  Diagnosis Date   Anginal pain (HCC)    Basal cell carcinoma 06/20/2009   R nose supratip   Bladder cancer (HCC)    Cancer (HCC)    Chicken pox    Chronic kidney disease    Coronary artery disease    Diabetes mellitus without complication (HCC)    Headache    Hypertension    Myocardial infarction (HCC)    Plantar fasciitis    Prostate cancer [facility])     Patient Active Problem List   Diagnosis Date Noted   Severe sepsis (HCC) 10/10/2019   Acute renal failure superimposed on stage 3b chronic kidney disease (HCC) 10/10/2019   Coronary artery disease    Cardiomyopathy (HCC)    Elevated troponin    Diabetes mellitus (HCC) 11/14/2016   Diabetes mellitus type 2, uncomplicated (HCC) 11/14/2016   HTN (hypertension) 11/14/2016   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HCC) 10/17/2016   Benign essential hypertension 11/10/2014   Bladder cancer (HCC) 10/18/2013    Past Surgical History:  Procedure Laterality Date   CARDIOVERSION N/A 04/23/2017   Procedure: CARDIOVERSION;  Surgeon: Hester Wolm PARAS, MD;  Location: [facility] ORS;  Service: Cardiovascular;  Laterality: N/A;   CARDIOVERSION N/A 03/27/2018   Procedure: CARDIOVERSION (CATH LAB);  Surgeon: Hester Wolm PARAS, MD;  Location: [facility] ORS;  Service: Cardiovascular;  Laterality: N/A;   COLONOSCOPY     COLONOSCOPY WITH PROPOFOL  N/A 10/27/2014   Procedure: COLONOSCOPY WITH PROPOFOL ;  Surgeon: Lamar ONEIDA Holmes, MD;  Location: [facility]  ENDOSCOPY;  Service: Endoscopy;  Laterality: N/A;   CORONARY ANGIOPLASTY     CYSTECTOMY     HERNIA REPAIR     LITHOTRIPSY     MOHS SURGERY     PROSTATE SURGERY         Home Medications    Prior to Admission medications   Medication Sig Start Date End Date Taking? Authorizing Provider  amiodarone (PACERONE) 200 MG tablet Take 1 tablet by mouth daily. 12/19/22 12/19/23 Yes [provider]  amLODipine (NORVASC) 10 MG tablet AMLODIPINE BESYLATE 10 MG TABS 03/08/13  Yes [provider]  atorvastatin  (LIPITOR) 20 MG tablet Take 10 mg by mouth daily.    Yes [provider]  cephALEXin  (KEFLEX ) 500 MG capsule Take 1 capsule (500 mg total) by mouth 4 (four) times daily. 10/11/23  Yes Faizah Kandler R, NP  Cholecalciferol (VITAMIN D-1000 MAX ST) 25 MCG (1000 UT) tablet Take 1 tablet by mouth daily. 12/07/21  Yes [provider]  diazepam (VALIUM) 5 MG tablet SMARTSIG:1-2 Tablet(s) By Mouth 05/14/23  Yes [provider]  ELIQUIS  5 MG TABS tablet Take 5 mg by mouth 2 (two) times daily. 03/24/17  Yes [provider]  fentaNYL  (DURAGESIC ) 25 MCG/HR Place 1 patch onto the skin.   Yes [provider]  hydrALAZINE  (APRESOLINE ) 25 MG tablet Take 25 mg by mouth 3 (three) times daily.   Yes  [provider]  isosorbide dinitrate (ISORDIL) 10 MG tablet Take 10 mg by mouth 3 (three) times daily.   Yes [provider]  oxyCODONE  (ROXICODONE ) 15 MG immediate release tablet Take 15 mg by mouth. 09/23/23  Yes [provider]  Oxycodone  HCl 10 MG TABS Take 10 mg by mouth. 07/17/23  Yes [provider]  sodium bicarbonate  650 MG tablet Take by mouth 2 (two) times daily.   Yes [provider]  torsemide (DEMADEX) 20 MG tablet Take 20 mg by mouth 2 (two) times daily. 02/12/20  Yes [provider]  vitamin B-12 (CYANOCOBALAMIN) 1000 MCG tablet Take 1,000 mcg by mouth daily. 09/02/19  Yes [provider]   clobetasol  cream (TEMOVATE ) 0.05 % Apply twice daily to affected areas as needed for itching. Avoid applying to face, groin, and axilla 01/08/23   Hester Alm BROCKS, MD  glipiZIDE (GLUCOTROL XL) 5 MG 24 hr tablet Take 5 mg by mouth 2 (two) times daily.     [provider]  metolazone (ZAROXOLYN) 2.5 MG tablet Take by mouth.    [provider]  pantoprazole  (PROTONIX ) 20 MG tablet Take 20 mg by mouth daily. 10/27/20   [provider]  potassium chloride  20 MEQ/15ML (10%) SOLN Take 20 mEq by mouth daily. 09/21/20   [provider]  traZODone (DESYREL) 50 MG tablet Take by mouth. 08/27/22   [provider]    Family History Family History  Problem Relation Age of Onset   Multiple myeloma Mother     Social History Social History   Tobacco Use   Smoking status: Former    Current packs/day: 0.00    Average packs/day: 1.5 packs/day for 25.0 years (37.5 ttl pk-yrs)    Types: Cigarettes    Start date: 05/19/1976    Quit date: 05/19/2001    Years since quitting: 22.4   Smokeless tobacco: Never  Vaping Use   Vaping status: Never Used  Substance Use Topics   Alcohol  use: Yes    Comment: occasional   Drug use: No     Allergies   Compazine [prochlorperazine]   Review of Systems Review of Systems   Physical Exam Triage Vital Signs ED Triage Vitals  Encounter Vitals Group     BP 10/11/23 0823 120/75     Girls Systolic BP Percentile --      Girls Diastolic BP Percentile --      Boys Systolic BP Percentile --      Boys Diastolic BP Percentile --      Pulse Rate 10/11/23 0823 85     Resp 10/11/23 0823 16     Temp 10/11/23 0823 98 F (36.7 C)     Temp Source 10/11/23 0823 Oral     SpO2 10/11/23 0823 97 %     Weight --      Height --      Head Circumference --      Peak Flow --      Pain Score 10/11/23 0819 0     Pain Loc --      Pain Education --      Exclude from Growth Chart --    No data found.  Updated Vital Signs BP  120/75 (BP Location: Left Arm)   Pulse 85   Temp 98 F (36.7 C) (Oral)   Resp 16   SpO2 97%   Visual Acuity Right Eye Distance:   Left Eye Distance:   Bilateral Distance:    Right  Eye Near:   Left Eye Near:    Bilateral Near:     Physical Exam Constitutional:      Appearance: Normal appearance.  Eyes:     Extraocular Movements: Extraocular movements intact.  Pulmonary:     Effort: Pulmonary effort is normal.  Abdominal:     Tenderness: There is no abdominal tenderness. There is no right CVA tenderness, left CVA tenderness or guarding.  Neurological:     Mental Status: He is alert and oriented to person, place, and time. Mental status is at baseline.      UC Treatments / Results  Labs (all labs ordered are listed, but only abnormal results are displayed) Labs Reviewed  POCT URINE DIPSTICK - Abnormal; Notable for the following components:      Result Value   Color, UA straw (*)    Clarity, UA cloudy (*)    Blood, UA large (*)    pH, UA >=9.0 (*)    Protein Ur, POC =100 (*)    Leukocytes, UA Large (3+) (*)    All other components within normal limits  URINE CULTURE    EKG   Radiology No results found.  Procedures Procedures (including critical care time)  Medications Ordered in UC Medications - No data to display  Initial Impression / Assessment and Plan / UC Course  I have reviewed the triage vital signs and the nursing notes.  Pertinent labs & imaging results that were available during my care of the patient were reviewed by me and considered in my medical decision making (see chart for details).  Bad odor of urine  Urinalysis showing leukocytes and hemoglobin, negative for nitrates, sent for culture, discussed findings with patient and spouse, empirically placed on cephalexin  as she is symptomatic, last urine culture available shows Proteus Mirabella's, antibiotic chosen from this, recommended supportive care and advised follow-up for symptoms that  are persisting or worsen Final Clinical Impressions(s) / UC Diagnoses   Final diagnoses:  Bad odor of urine     Discharge Instructions      Your urinalysis shows Lace Chenevert blood cells but does not show bacteria at this time , your urine will be sent to the lab to determine exactly which bacteria is present, if any changes need to be made to your medications you will be notified  Begin use of Keflex  every 6 hours for 5 days   Continue to change and manage your ostomy bag as directed, no signs of infection surrounding it  If symptoms continue to persist after use of medication or recur please follow-up with urgent care or your primary doctor as needed    ED Prescriptions     Medication Sig Dispense Auth. Provider   cephALEXin  (KEFLEX ) 500 MG capsule Take 1 capsule (500 mg total) by mouth 4 (four) times daily. 20 capsule Viera Okonski R, NP      PDMP not reviewed this encounter.   Teresa Shelba SAUN[place], [place] 10/11/23 [phone]

## 2023-10-11 NOTE — ED Triage Notes (Signed)
 Wife states that she's noticed patient being confused for about 2 days. Wife states that he has kidney failure.

## 2023-10-11 NOTE — Discharge Instructions (Addendum)
 Your urinalysis shows Larry Mann blood cells but does not show bacteria at this time , your urine will be sent to the lab to determine exactly which bacteria is present, if any changes need to be made to your medications you will be notified  Begin use of Keflex  every 6 hours for 5 days   Continue to change and manage your ostomy bag as directed, no signs of infection surrounding it  If symptoms continue to persist after use of medication or recur please follow-up with urgent care or your primary doctor as needed

## 2023-10-12 ENCOUNTER — Emergency Department

## 2023-10-12 ENCOUNTER — Inpatient Hospital Stay
Admission: EM | Admit: 2023-10-12 | Discharge: 2023-11-10 | DRG: 871 | Disposition: E | Attending: Internal Medicine | Admitting: Internal Medicine

## 2023-10-12 ENCOUNTER — Other Ambulatory Visit: Payer: Self-pay

## 2023-10-12 DIAGNOSIS — I2510 Atherosclerotic heart disease of native coronary artery without angina pectoris: Secondary | ICD-10-CM | POA: Diagnosis present

## 2023-10-12 DIAGNOSIS — I130 Hypertensive heart and chronic kidney disease with heart failure and stage 1 through stage 4 chronic kidney disease, or unspecified chronic kidney disease: Secondary | ICD-10-CM | POA: Diagnosis present

## 2023-10-12 DIAGNOSIS — J9601 Acute respiratory failure with hypoxia: Secondary | ICD-10-CM | POA: Diagnosis present

## 2023-10-12 DIAGNOSIS — Z9861 Coronary angioplasty status: Secondary | ICD-10-CM

## 2023-10-12 DIAGNOSIS — E872 Acidosis, unspecified: Secondary | ICD-10-CM | POA: Diagnosis present

## 2023-10-12 DIAGNOSIS — Z66 Do not resuscitate: Secondary | ICD-10-CM | POA: Diagnosis present

## 2023-10-12 DIAGNOSIS — R188 Other ascites: Secondary | ICD-10-CM | POA: Diagnosis present

## 2023-10-12 DIAGNOSIS — E1122 Type 2 diabetes mellitus with diabetic chronic kidney disease: Secondary | ICD-10-CM | POA: Diagnosis present

## 2023-10-12 DIAGNOSIS — D631 Anemia in chronic kidney disease: Secondary | ICD-10-CM | POA: Diagnosis present

## 2023-10-12 DIAGNOSIS — N179 Acute kidney failure, unspecified: Secondary | ICD-10-CM | POA: Diagnosis present

## 2023-10-12 DIAGNOSIS — K8050 Calculus of bile duct without cholangitis or cholecystitis without obstruction: Principal | ICD-10-CM | POA: Diagnosis present

## 2023-10-12 DIAGNOSIS — Z515 Encounter for palliative care: Secondary | ICD-10-CM | POA: Diagnosis not present

## 2023-10-12 DIAGNOSIS — I509 Heart failure, unspecified: Secondary | ICD-10-CM

## 2023-10-12 DIAGNOSIS — A419 Sepsis, unspecified organism: Secondary | ICD-10-CM | POA: Diagnosis present

## 2023-10-12 DIAGNOSIS — K8309 Other cholangitis: Secondary | ICD-10-CM

## 2023-10-12 DIAGNOSIS — R7989 Other specified abnormal findings of blood chemistry: Secondary | ICD-10-CM | POA: Diagnosis not present

## 2023-10-12 DIAGNOSIS — R1030 Lower abdominal pain, unspecified: Secondary | ICD-10-CM | POA: Diagnosis present

## 2023-10-12 DIAGNOSIS — R000 Tachycardia, unspecified: Secondary | ICD-10-CM | POA: Diagnosis not present

## 2023-10-12 DIAGNOSIS — Z8546 Personal history of malignant neoplasm of prostate: Secondary | ICD-10-CM

## 2023-10-12 DIAGNOSIS — K8071 Calculus of gallbladder and bile duct without cholecystitis with obstruction: Secondary | ICD-10-CM | POA: Diagnosis present

## 2023-10-12 DIAGNOSIS — Z7901 Long term (current) use of anticoagulants: Secondary | ICD-10-CM | POA: Diagnosis not present

## 2023-10-12 DIAGNOSIS — R54 Age-related physical debility: Secondary | ICD-10-CM | POA: Diagnosis present

## 2023-10-12 DIAGNOSIS — E119 Type 2 diabetes mellitus without complications: Secondary | ICD-10-CM | POA: Diagnosis not present

## 2023-10-12 DIAGNOSIS — E876 Hypokalemia: Secondary | ICD-10-CM | POA: Diagnosis not present

## 2023-10-12 DIAGNOSIS — N184 Chronic kidney disease, stage 4 (severe): Secondary | ICD-10-CM | POA: Diagnosis present

## 2023-10-12 DIAGNOSIS — Z8551 Personal history of malignant neoplasm of bladder: Secondary | ICD-10-CM

## 2023-10-12 DIAGNOSIS — N390 Urinary tract infection, site not specified: Secondary | ICD-10-CM | POA: Diagnosis present

## 2023-10-12 DIAGNOSIS — E8721 Acute metabolic acidosis: Secondary | ICD-10-CM | POA: Diagnosis not present

## 2023-10-12 DIAGNOSIS — Z85828 Personal history of other malignant neoplasm of skin: Secondary | ICD-10-CM

## 2023-10-12 DIAGNOSIS — I252 Old myocardial infarction: Secondary | ICD-10-CM

## 2023-10-12 DIAGNOSIS — D696 Thrombocytopenia, unspecified: Secondary | ICD-10-CM | POA: Diagnosis present

## 2023-10-12 DIAGNOSIS — K8031 Calculus of bile duct with cholangitis, unspecified, with obstruction: Secondary | ICD-10-CM | POA: Diagnosis present

## 2023-10-12 DIAGNOSIS — K7460 Unspecified cirrhosis of liver: Secondary | ICD-10-CM | POA: Diagnosis present

## 2023-10-12 DIAGNOSIS — Z888 Allergy status to other drugs, medicaments and biological substances status: Secondary | ICD-10-CM

## 2023-10-12 DIAGNOSIS — R6521 Severe sepsis with septic shock: Secondary | ICD-10-CM | POA: Diagnosis present

## 2023-10-12 DIAGNOSIS — Z932 Ileostomy status: Secondary | ICD-10-CM

## 2023-10-12 DIAGNOSIS — E11649 Type 2 diabetes mellitus with hypoglycemia without coma: Secondary | ICD-10-CM | POA: Diagnosis not present

## 2023-10-12 DIAGNOSIS — Z7984 Long term (current) use of oral hypoglycemic drugs: Secondary | ICD-10-CM

## 2023-10-12 DIAGNOSIS — Z79891 Long term (current) use of opiate analgesic: Secondary | ICD-10-CM

## 2023-10-12 DIAGNOSIS — Z906 Acquired absence of other parts of urinary tract: Secondary | ICD-10-CM

## 2023-10-12 DIAGNOSIS — G9341 Metabolic encephalopathy: Secondary | ICD-10-CM | POA: Diagnosis present

## 2023-10-12 DIAGNOSIS — Z87891 Personal history of nicotine dependence: Secondary | ICD-10-CM

## 2023-10-12 DIAGNOSIS — N329 Bladder disorder, unspecified: Secondary | ICD-10-CM | POA: Diagnosis present

## 2023-10-12 DIAGNOSIS — I5043 Acute on chronic combined systolic (congestive) and diastolic (congestive) heart failure: Secondary | ICD-10-CM | POA: Diagnosis present

## 2023-10-12 DIAGNOSIS — Z79899 Other long term (current) drug therapy: Secondary | ICD-10-CM

## 2023-10-12 DIAGNOSIS — R748 Abnormal levels of other serum enzymes: Secondary | ICD-10-CM | POA: Diagnosis present

## 2023-10-12 DIAGNOSIS — M722 Plantar fascial fibromatosis: Secondary | ICD-10-CM | POA: Diagnosis present

## 2023-10-12 LAB — COMPREHENSIVE METABOLIC PANEL WITH GFR
ALT: 50 U/L — ABNORMAL HIGH (ref 0–44)
AST: 139 U/L — ABNORMAL HIGH (ref 15–41)
Albumin: 3.1 g/dL — ABNORMAL LOW (ref 3.5–5.0)
Alkaline Phosphatase: 514 U/L — ABNORMAL HIGH (ref 38–126)
Anion gap: 12 (ref 5–15)
BUN: 60 mg/dL — ABNORMAL HIGH (ref 8–23)
CO2: 23 mmol/L (ref 22–32)
Calcium: 8.6 mg/dL — ABNORMAL LOW (ref 8.9–10.3)
Chloride: 97 mmol/L — ABNORMAL LOW (ref 98–111)
Creatinine, Ser: 4.98 mg/dL — ABNORMAL HIGH (ref 0.61–1.24)
GFR, Estimated: 12 mL/min — ABNORMAL LOW (ref >60)
Glucose, Bld: 196 mg/dL — ABNORMAL HIGH (ref 70–99)
Potassium: 3.7 mmol/L (ref 3.5–5.1)
Sodium: 132 mmol/L — ABNORMAL LOW (ref 135–145)
Total Bilirubin: 2.8 mg/dL — ABNORMAL HIGH (ref 0.0–1.2)
Total Protein: 7 g/dL (ref 6.5–8.1)

## 2023-10-12 LAB — CBC
HCT: 32.6 % — ABNORMAL LOW (ref 39.0–52.0)
Hemoglobin: 10 g/dL — ABNORMAL LOW (ref 13.0–17.0)
MCH: 29.6 pg (ref 26.0–34.0)
MCHC: 30.7 g/dL (ref 30.0–36.0)
MCV: 96.4 fL (ref 80.0–100.0)
Platelets: 214 K/uL (ref 150–400)
RBC: 3.38 MIL/uL — ABNORMAL LOW (ref 4.22–5.81)
RDW: 16.2 % — ABNORMAL HIGH (ref 11.5–15.5)
WBC: 11 K/uL — ABNORMAL HIGH (ref 4.0–10.5)
nRBC: 0 % (ref 0.0–0.2)

## 2023-10-12 LAB — LIPASE, BLOOD: Lipase: 38 U/L (ref 11–51)

## 2023-10-12 MED ORDER — PIPERACILLIN-TAZOBACTAM 3.375 G IVPB
3.3750 g | Freq: Two times a day (BID) | INTRAVENOUS | Status: DC
  Administered 2023-10-12 – 2023-10-13 (×3): 3.375 g via INTRAVENOUS
  Filled 2023-10-12 (×3): qty 50

## 2023-10-12 MED ORDER — SODIUM CHLORIDE 0.9 % IV SOLN: INTRAVENOUS | Status: DC

## 2023-10-12 MED ORDER — ACETAMINOPHEN 650 MG RE SUPP
650.0000 mg | Freq: Four times a day (QID) | RECTAL | Status: DC | PRN
  Administered 2023-10-13: 650 mg via RECTAL
  Filled 2023-10-12: qty 1

## 2023-10-12 MED ORDER — PIPERACILLIN-TAZOBACTAM 3.375 G IVPB 30 MIN
3.3750 g | Freq: Once | INTRAVENOUS | Status: DC
  Filled 2023-10-12: qty 50

## 2023-10-12 MED ORDER — HYDROMORPHONE HCL 1 MG/ML IJ SOLN
1.0000 mg | Freq: Once | INTRAMUSCULAR | Status: AC
  Administered 2023-10-12: 1 mg via INTRAVENOUS
  Filled 2023-10-12: qty 1

## 2023-10-12 MED ORDER — SODIUM CHLORIDE 0.9 % IV BOLUS
1000.0000 mL | Freq: Once | INTRAVENOUS | Status: AC
  Administered 2023-10-12: 1000 mL via INTRAVENOUS

## 2023-10-12 MED ORDER — PANTOPRAZOLE SODIUM 40 MG IV SOLR
40.0000 mg | Freq: Once | INTRAVENOUS | Status: AC
  Administered 2023-10-12: 40 mg via INTRAVENOUS
  Filled 2023-10-12: qty 10

## 2023-10-12 MED ORDER — HYDROMORPHONE HCL 1 MG/ML IJ SOLN: 0.5000 mg | INTRAMUSCULAR | Status: DC | PRN

## 2023-10-12 MED ORDER — SENNOSIDES-DOCUSATE SODIUM 8.6-50 MG PO TABS: 1.0000 | ORAL_TABLET | Freq: Every evening | ORAL | Status: DC | PRN

## 2023-10-12 MED ORDER — ONDANSETRON HCL 4 MG/2ML IJ SOLN
4.0000 mg | Freq: Once | INTRAMUSCULAR | Status: AC
  Administered 2023-10-12: 4 mg via INTRAVENOUS
  Filled 2023-10-12: qty 2

## 2023-10-12 MED ORDER — HYDRALAZINE HCL 20 MG/ML IJ SOLN: 5.0000 mg | Freq: Four times a day (QID) | INTRAMUSCULAR | Status: DC | PRN

## 2023-10-12 MED ORDER — ONDANSETRON HCL 4 MG PO TABS: 4.0000 mg | ORAL_TABLET | Freq: Four times a day (QID) | ORAL | Status: DC | PRN

## 2023-10-12 MED ORDER — HEPARIN SODIUM (PORCINE) 5000 UNIT/ML IJ SOLN
5000.0000 [IU] | Freq: Three times a day (TID) | INTRAMUSCULAR | Status: DC
  Administered 2023-10-12 – 2023-10-13 (×3): 5000 [IU] via SUBCUTANEOUS
  Filled 2023-10-12 (×4): qty 1

## 2023-10-12 MED ORDER — ONDANSETRON HCL 4 MG/2ML IJ SOLN: 4.0000 mg | Freq: Four times a day (QID) | INTRAMUSCULAR | Status: DC | PRN

## 2023-10-12 MED ORDER — INSULIN ASPART 100 UNIT/ML IJ SOLN: 0.0000 [IU] | Freq: Four times a day (QID) | INTRAMUSCULAR | Status: DC

## 2023-10-12 MED ORDER — ACETAMINOPHEN 325 MG PO TABS: 650.0000 mg | ORAL_TABLET | Freq: Four times a day (QID) | ORAL | Status: DC | PRN

## 2023-10-12 NOTE — H&P (Signed)
 History and Physical    Patient: Larry Mann FMW:[id number] DOB: [birth year] DOA: 10/12/2023 DOS: the patient was seen and examined on 10/12/2023 PCP: Valora Agent, MD  Patient coming from: Home  Chief Complaint:  Chief Complaint  Patient presents with   Abdominal Pain   HPI: Larry Mann is a 73 y.o. male with medical history significant of bladder cancer s/p ileostomy not on treatment, followed by palliative care, chronic pain on opioid therapy, chronic HFrEF (echo on file from 2023 was EF 15% and grade III diastolic dysfunction), CKD stage 4/5, CAD, HTN, type 2 diabetes, follows at [facility] for his care who presented to the ED for evaluation of worsening abdominal pain and nausea/vomiting.  History primary obtained from ED records, chart review and patient's wife at bedside, due to patient lethargic and confused, unable to participate.  Patient's wife reports taking him to urgent care yesterday suspecting pt may have a UTI.  Pt had been acting unusual, confused for 2 mornings in a row, and urine in his ileostomy had foul odor.  Urgent care did a UA, sent for culture and started Keflex .  Today, patient developed worsening pain and persistent nausea/vomiting.  Wife reports pt has baseline lower abdominal pain from bladder mass and hernias, and he will intermittently have N/V, but nothing as severe and persistent as today.  Patient's usual pain regimen not controlling his abdominal pain.  Patient visibly shaking with chills, but no fevers noted at home.  Wife reports the shaking chills just started more recently.  Discussed code status with wife - she reports patient is DNR/DNI and follows with palliative care.  She is agreeable for procedures to address the current problem.    ED course -- Initial vitals HR 90, RR 18, BP 147/64, spO2 100% on room air, temp 97.6 F.  Labs obtained including CMP, CBC which were notable for sodium 132, chloride 97, glucose 196, BUN 60, Cr 4.98, Ca 8.6, albumin, 3.1,  alk phos 514, AST 139, ALT 50, T bili 2.8, WBC 11.0, Hbg 10.0.  CT abdomen/pelvis showed an 8 mm distal obstructing CBD stone with moderate common bile duct dilation, cholelithiasis with moderate gallbladder distention.  Other findings per report.  Doppler U/S of LEFT lower extremity negative for DVT.  Patient was treated with bolus IV fluids, Zosyn , IV dilaudid , IV Zofran , IV Protonix  and is being admitted for further evaluation and management as outlined in detail below.   Review of Systems: unable to review all systems due to the inability of the patient to answer questions.   Past Medical History:  Diagnosis Date   Anginal pain (HCC)    Basal cell carcinoma 06/20/2009   R nose supratip   Bladder cancer (HCC)    Cancer (HCC)    Chicken pox    Chronic kidney disease    Coronary artery disease    Diabetes mellitus without complication (HCC)    Headache    Hypertension    Myocardial infarction (HCC)    Plantar fasciitis    Prostate cancer [facility])    Past Surgical History:  Procedure Laterality Date   CARDIOVERSION N/A 04/23/2017   Procedure: CARDIOVERSION;  Surgeon: Hester Wolm PARAS, MD;  Location: [facility] ORS;  Service: Cardiovascular;  Laterality: N/A;   CARDIOVERSION N/A 03/27/2018   Procedure: CARDIOVERSION (CATH LAB);  Surgeon: Hester Wolm PARAS, MD;  Location: [facility] ORS;  Service: Cardiovascular;  Laterality: N/A;   COLONOSCOPY     COLONOSCOPY WITH PROPOFOL  N/A 10/27/2014   Procedure: COLONOSCOPY  WITH PROPOFOL ;  Surgeon: Lamar ONEIDA Holmes, MD;  Location: [facility] ENDOSCOPY;  Service: Endoscopy;  Laterality: N/A;   CORONARY ANGIOPLASTY     CYSTECTOMY     HERNIA REPAIR     LITHOTRIPSY     MOHS SURGERY     PROSTATE SURGERY     Social History:  reports that he quit smoking about 22 years ago. He started smoking about 47 years ago. He has a 37.5 pack-year smoking history. He has never used smokeless tobacco. He reports current alcohol  use. He reports that he does not use  drugs.  Allergies  Allergen Reactions   Compazine [Prochlorperazine] Other (See Comments)    Hallucinations per pt I get naked and want to pray to everyone    Family History  Problem Relation Age of Onset   Multiple myeloma Mother     Prior to Admission medications   Medication Sig Start Date End Date Taking? Authorizing Provider  amiodarone (PACERONE) 200 MG tablet Take 1 tablet by mouth daily. 12/19/22 12/19/23  [provider]  amLODipine (NORVASC) 10 MG tablet AMLODIPINE BESYLATE 10 MG TABS 03/08/13   [provider]  atorvastatin  (LIPITOR) 20 MG tablet Take 10 mg by mouth daily.     [provider]  cephALEXin  (KEFLEX ) 500 MG capsule Take 1 capsule (500 mg total) by mouth 4 (four) times daily. 10/11/23   Teresa Shelba SAUNDERS, NP  Cholecalciferol (VITAMIN D-1000 MAX ST) 25 MCG (1000 UT) tablet Take 1 tablet by mouth daily. 12/07/21   [provider]  clobetasol  cream (TEMOVATE ) 0.05 % Apply twice daily to affected areas as needed for itching. Avoid applying to face, groin, and axilla 01/08/23   Hester Alm BROCKS, MD  diazepam (VALIUM) 5 MG tablet SMARTSIG:1-2 Tablet(s) By Mouth 05/14/23   [provider]  ELIQUIS  5 MG TABS tablet Take 5 mg by mouth 2 (two) times daily. 03/24/17   [provider]  fentaNYL  (DURAGESIC ) 25 MCG/HR Place 1 patch onto the skin.    [provider]  glipiZIDE (GLUCOTROL XL) 5 MG 24 hr tablet Take 5 mg by mouth 2 (two) times daily.     [provider]  hydrALAZINE  (APRESOLINE ) 25 MG tablet Take 25 mg by mouth 3 (three) times daily.    [provider]  isosorbide dinitrate (ISORDIL) 10 MG tablet Take 10 mg by mouth 3 (three) times daily.    [provider]  metolazone (ZAROXOLYN) 2.5 MG tablet Take by mouth.    [provider]  oxyCODONE  (ROXICODONE ) 15 MG immediate release tablet Take 15 mg by mouth. 09/23/23   [provider]  Oxycodone  HCl 10 MG TABS Take 10  mg by mouth. 07/17/23   [provider]  pantoprazole  (PROTONIX ) 20 MG tablet Take 20 mg by mouth daily. 10/27/20   [provider]  potassium chloride  20 MEQ/15ML (10%) SOLN Take 20 mEq by mouth daily. 09/21/20   [provider]  sodium bicarbonate  650 MG tablet Take by mouth 2 (two) times daily.    [provider]  torsemide (DEMADEX) 20 MG tablet Take 20 mg by mouth 2 (two) times daily. 02/12/20   [provider]  traZODone (DESYREL) 50 MG tablet Take by mouth. 08/27/22   [provider]  vitamin B-12 (CYANOCOBALAMIN) 1000 MCG tablet Take 1,000 mcg by mouth daily. 09/02/19   [provider]    Physical Exam: Vitals:   10/12/23 2004 10/12/23 2005  BP: (!) 147/64   Pulse: 90  Resp: 18   Temp: 97.6 F (36.4 C)   TempSrc: Oral   SpO2: 100%   Weight:  59 kg  Height:  5' 6 (1.676 m)   General exam: lethargic, responds to voice briefly, no acute distress HEENT: keeps eyes closed, moist mucus membranes, hearing grossly normal  Respiratory system: CTAB but diminished, no wheezes, rales or rhonchi, normal respiratory effort. Cardiovascular system: normal S1/S2, RRR, cardiac device present, Left > right lower extremity edema (left 3+ pitting).   Gastrointestinal system: soft, tenderness on palpation, ileostomy present Central nervous system: exam limited, pt lethargic, responds to voice, follows commands, no gross focal neurologic deficits, normal speech Extremities: BLE venous stasis changes Skin: dry, intact, normal temperature Psychiatry: limited exam due to lethargy   Data Reviewed:  As reviewed in detail above  Assessment and Plan:  Choledocholithiasis 8 mm obstructing CBD stone with moderate CBD dilation on CT abd/pelvis. No clear cholangitis but with mild leukocytosis, rigors noted on exam, confusion reported by wife --GI to be consulted in AM for ERCP --Continue Zosyn  empirically, per pharmacy --Very gentle IV  fluids: NS @ 40 cc/hr --IV Zofran  PRN nausea --IV Dilaudid  PRN mod-severe pain --Clear liquids, NPO after 2 AM --Monitor CMP, CBC, fever curve  Chronic combined HFrEF Given 1 L bolus fluids in ED. Last echo in [facility] from 2023 showed EF 15%, grade III dd. Other than LLE edema (U/S ruled out DVT), appears overall euvolemic. --Monitor volume status closely  Hx of Bladder Cancer s/p ileostomy Followed by Palliative Care Not on treatment --Continue home pain regimen pending verification  CKD stage IV --Monitor closely --Avoid nephrotoxins & hypotension  Type 2 diabetes --Sliding scale Novolog  Q6H for now, change to AC/HS when diet resumed  CAD - stable, no chest pain reported --Resume home meds pending med history    Advance Care Planning: Code status DNR/DNI Confirmed by patient's wife at bedside.  Consults: GI - Dr. Jinny to be notified in AM   Family Communication: wife at bedside  Severity of Illness: The appropriate patient status for this patient is INPATIENT. Inpatient status is judged to be reasonable and necessary in order to provide the required intensity of service to ensure the patient's safety. The patient's presenting symptoms, physical exam findings, and initial radiographic and laboratory data in the context of their chronic comorbidities is felt to place them at high risk for further clinical deterioration. Furthermore, it is not anticipated that the patient will be medically stable for discharge from the hospital within 2 midnights of admission.   * I certify that at the point of admission it is my clinical judgment that the patient will require inpatient hospital care spanning beyond 2 midnights from the point of admission due to high intensity of service, high risk for further deterioration and high frequency of surveillance required.*  Author: Burnard DELENA Cunning, DO 10/12/2023 10:47 PM  For on call review [email or web address].

## 2023-10-12 NOTE — ED Provider Notes (Signed)
 [facility] Provider Note    Event Date/Time   First MD Initiated Contact with Patient 10/12/23 2015     (approximate)   History   Chief Complaint: Abdominal Pain   HPI  Larry Mann is a 73 y.o. male with a history of bladder cancer, CHF, diabetes, CKD, hypertension who comes ED complaining of severe upper abdominal pain radiating to the back associated with nausea and vomiting.  Not improved with fentanyl  patch and Zofran  at home.  No fever but does have chills.        Past Medical History:  Diagnosis Date   Anginal pain (HCC)    Basal cell carcinoma 06/20/2009   R nose supratip   Bladder cancer (HCC)    Cancer (HCC)    Chicken pox    Chronic kidney disease    Coronary artery disease    Diabetes mellitus without complication (HCC)    Headache    Hypertension    Myocardial infarction [facility])    Plantar fasciitis    Prostate cancer [facility])     Current Outpatient Rx   Order #: [id number] Class: Historical Med   Order #: [id number] Class: Historical Med   Order #: [id number] Class: Historical Med   Order #: [id number] Class: Historical Med   Order #: [id number] Class: Historical Med   Order #: [id number] Class: Historical Med   Order #: [id number] Class: Historical Med   Order #: [id number] Class: Historical Med   Order #: [id number] Class: Historical Med   Order #: [id number] Class: Historical Med   Order #: [id number] Class: Historical Med   Order #: [id number] Class: Historical Med   Order #: [id number] Class: Historical Med   Order #: [id number] Class: Historical Med   Order #: [id number] Class: Historical Med   Order #: [id number] Class: Historical Med   Order #: [id number] Class: Historical Med   Order #: [id number] Class: Historical Med   Order #: [id number] Class: Normal   Order #: [id number] Class: Normal   Order #: [id number] Class: Historical Med   Order #: [id number] Class: Historical Med   Order #: [id number] Class: Historical Med   Order #: [id number] Class:  Historical Med   Order #: [id number] Class: Historical Med   Order #: [id number] Class: Historical Med   Order #: [id number] Class: Historical Med   Order #: [id number] Class: Historical Med   Order #: [id number] Class: Historical Med   Order #: [id number] Class: Historical Med    Past Surgical History:  Procedure Laterality Date   CARDIOVERSION N/A 04/23/2017   Procedure: CARDIOVERSION;  Surgeon: Hester Wolm PARAS, MD;  Location: [facility] ORS;  Service: Cardiovascular;  Laterality: N/A;   CARDIOVERSION N/A 03/27/2018   Procedure: CARDIOVERSION (CATH LAB);  Surgeon: Hester Wolm PARAS, MD;  Location: [facility] ORS;  Service: Cardiovascular;  Laterality: N/A;   COLONOSCOPY     COLONOSCOPY WITH PROPOFOL  N/A 10/27/2014   Procedure: COLONOSCOPY WITH PROPOFOL ;  Surgeon: Lamar ONEIDA Holmes, MD;  Location: [facility] ENDOSCOPY;  Service: Endoscopy;  Laterality: N/A;   CORONARY ANGIOPLASTY     CYSTECTOMY     HERNIA REPAIR     LITHOTRIPSY     MOHS SURGERY     PROSTATE SURGERY      Physical Exam   Triage Vital Signs: ED Triage Vitals  Encounter Vitals Group     BP 10/12/23 2004 (!) 147/64     Girls Systolic BP Percentile --      Girls Diastolic BP Percentile --      Boys Systolic BP Percentile --      Boys Diastolic BP Percentile --      Pulse  Rate 10/12/23 2004 90     Resp 10/12/23 2004 18     Temp 10/12/23 2004 97.6 F (36.4 C)     Temp Source 10/12/23 2004 Oral     SpO2 10/12/23 2004 100 %     Weight 10/12/23 2005 130 lb (59 kg)     Height 10/12/23 2005 5' 6 (1.676 m)     Head Circumference --      Peak Flow --      Pain Score 10/12/23 2009 10     Pain Loc --      Pain Education --      Exclude from Growth Chart --     Most recent vital signs: Vitals:   10/12/23 2004  BP: (!) 147/64  Pulse: 90  Resp: 18  Temp: 97.6 F (36.4 C)  SpO2: 100%    General: Awake, ill-appearing.  CV:  Good peripheral perfusion.  Regular rate rhythm Resp:  Normal effort.  Clear to auscultation Abd:  No distention.   Soft with generalized abdominal tenderness. Other:  Dry oral mucosa.  There is asymmetric edema of the left lower extremity   ED Results / Procedures / Treatments   Labs (all labs ordered are listed, but only abnormal results are displayed) Labs Reviewed  COMPREHENSIVE METABOLIC PANEL WITH GFR - Abnormal; Notable for the following components:      Result Value   Sodium 132 (*)    Chloride 97 (*)    Glucose, Bld 196 (*)    BUN 60 (*)    Creatinine, Ser 4.98 (*)    Calcium  8.6 (*)    Albumin 3.1 (*)    AST 139 (*)    ALT 50 (*)    Alkaline Phosphatase 514 (*)    Total Bilirubin 2.8 (*)    GFR, Estimated 12 (*)    All other components within normal limits  CBC - Abnormal; Notable for the following components:   WBC 11.0 (*)    RBC 3.38 (*)    Hemoglobin 10.0 (*)    HCT 32.6 (*)    RDW 16.2 (*)    All other components within normal limits  LIPASE, BLOOD  URINALYSIS, ROUTINE W REFLEX MICROSCOPIC  COMPREHENSIVE METABOLIC PANEL WITH GFR  CBC     EKG    RADIOLOGY CT abdomen pelvis interpreted by me, no bowel obstruction or free air.  Radiology report reviewed noting choledocholithiasis.  Ultrasound left lower extremity negative for DVT   PROCEDURES:  Procedures   MEDICATIONS ORDERED IN ED: Medications  piperacillin -tazobactam (ZOSYN ) IVPB 3.375 g (has no administration in time range)  heparin  injection 5,000 Units (has no administration in time range)  0.9 %  sodium chloride  infusion (has no administration in time range)  acetaminophen  (TYLENOL ) tablet 650 mg (has no administration in time range)    Or  acetaminophen  (TYLENOL ) suppository 650 mg (has no administration in time range)  senna-docusate (Senokot-S) tablet 1 tablet (has no administration in time range)  ondansetron  (ZOFRAN ) tablet 4 mg (has no administration in time range)    Or  ondansetron  (ZOFRAN ) injection 4 mg (has no administration in time range)  hydrALAZINE  (APRESOLINE ) injection 5 mg (has  no administration in time range)  sodium chloride  0.9 % bolus 1,000 mL (1,000 mLs Intravenous New Bag/Given 10/12/23 2124)  ondansetron  (ZOFRAN ) injection 4 mg (4 mg Intravenous Given 10/12/23 2123)  HYDROmorphone  (DILAUDID ) injection 1 mg (1 mg Intravenous Given 10/12/23 2123)  pantoprazole  (PROTONIX ) injection 40 mg (40 mg Intravenous  Given 10/12/23 2123)     IMPRESSION / MDM / ASSESSMENT AND PLAN / ED COURSE  I reviewed the triage vital signs and the nursing notes.  DDx: Bowel obstruction, bowel perforation, metastatic disease, pancreatitis, cholecystitis, choledocholithiasis, UTI, AKI, electrolyte derangement, left lower extremity DVT  Patient's presentation is most consistent with acute presentation with potential threat to life or bodily function.  Patient presents with abdominal pain nausea vomiting, appears dehydrated.  Given Zofran  Protonix  Dilaudid  IV fluids for initial supportive care and symptom relief.  Labs show baseline CKD, there is transaminitis and elevated bilirubin.  Lipase normal, CT scan shows choledocholithiasis.  Discussing with patient and family member, they are interested in pursuing ERCP intervention.  Case discussed with the hospitalist team for further management.       FINAL CLINICAL IMPRESSION(S) / ED DIAGNOSES   Final diagnoses:  Choledocholithiasis  Type 2 diabetes mellitus without complication, unspecified whether long term insulin  use (HCC)  Chronic congestive heart failure, unspecified heart failure type (HCC)     Rx / DC Orders   ED Discharge Orders     None        Note:  This document was prepared using Dragon voice recognition software and may include unintentional dictation errors.   Viviann Pastor, MD 10/12/23 [phone]

## 2023-10-12 NOTE — Progress Notes (Signed)
 Pharmacy Antibiotic Note  Larry Mann is a 73 y.o. male admitted on 10/12/2023 with choledocholithiasis.  Pharmacy has been consulted for Zosyn  dosing.  Plan: Zosyn  3.375 gm IV Q12H EI ordered to start on 8/3 @ 2330.   Height: 5' 6 (167.6 cm) Weight: 59 kg (130 lb) IBW/kg (Calculated) : 63.8  Temp (24hrs), Avg:97.6 F (36.4 C), Min:97.6 F (36.4 C), Max:97.6 F (36.4 C)  Recent Labs  Lab 10/12/23 2005  WBC 11.0*  CREATININE 4.98*    Estimated Creatinine Clearance: 11.4 mL/min (A) (by C-G formula based on SCr of 4.98 mg/dL (H)).    Allergies  Allergen Reactions   Compazine [Prochlorperazine] Other (See Comments)    Hallucinations per pt I get naked and want to pray to everyone    Antimicrobials this admission:   >>    >>   Dose adjustments this admission:   Microbiology results:  BCx:   UCx:    Sputum:    MRSA PCR:   Thank you for allowing pharmacy to be a part of this patient's care.  Clois Montavon D 10/12/2023 11:29 PM

## 2023-10-12 NOTE — ED Triage Notes (Signed)
 Pt was Dx with a UTI recently and was placed on abx, and he also has cancer that was Dx at [facility] in which he is not receiving any treatment for. He is coming in for abd pain that started today as well and nausea and vomiting. Pt has tried zofran  and his fentanyl  patch at home, he is a palliative care patient as well.

## 2023-10-13 ENCOUNTER — Inpatient Hospital Stay

## 2023-10-13 ENCOUNTER — Inpatient Hospital Stay: Admitting: Anesthesiology

## 2023-10-13 ENCOUNTER — Encounter
Admission: EM | Disposition: E | Payer: Self-pay | Source: Home / Self Care | Attending: Student in an Organized Health Care Education/Training Program

## 2023-10-13 ENCOUNTER — Other Ambulatory Visit: Payer: PRIVATE HEALTH INSURANCE

## 2023-10-13 DIAGNOSIS — E119 Type 2 diabetes mellitus without complications: Secondary | ICD-10-CM

## 2023-10-13 DIAGNOSIS — I509 Heart failure, unspecified: Secondary | ICD-10-CM

## 2023-10-13 DIAGNOSIS — A419 Sepsis, unspecified organism: Secondary | ICD-10-CM

## 2023-10-13 DIAGNOSIS — N179 Acute kidney failure, unspecified: Secondary | ICD-10-CM | POA: Diagnosis not present

## 2023-10-13 DIAGNOSIS — R7989 Other specified abnormal findings of blood chemistry: Secondary | ICD-10-CM

## 2023-10-13 DIAGNOSIS — K8050 Calculus of bile duct without cholangitis or cholecystitis without obstruction: Secondary | ICD-10-CM

## 2023-10-13 DIAGNOSIS — I5043 Acute on chronic combined systolic (congestive) and diastolic (congestive) heart failure: Secondary | ICD-10-CM

## 2023-10-13 DIAGNOSIS — R6521 Severe sepsis with septic shock: Secondary | ICD-10-CM

## 2023-10-13 DIAGNOSIS — N184 Chronic kidney disease, stage 4 (severe): Secondary | ICD-10-CM

## 2023-10-13 DIAGNOSIS — E876 Hypokalemia: Secondary | ICD-10-CM

## 2023-10-13 DIAGNOSIS — R000 Tachycardia, unspecified: Secondary | ICD-10-CM

## 2023-10-13 DIAGNOSIS — K8309 Other cholangitis: Secondary | ICD-10-CM

## 2023-10-13 DIAGNOSIS — E8721 Acute metabolic acidosis: Secondary | ICD-10-CM

## 2023-10-13 HISTORY — PX: ERCP: SHX5425

## 2023-10-13 LAB — COMPREHENSIVE METABOLIC PANEL WITH GFR
ALT: 61 U/L — ABNORMAL HIGH (ref 0–44)
ALT: 117 U/L — ABNORMAL HIGH (ref 0–44)
ALT: 181 U/L — ABNORMAL HIGH (ref 0–44)
AST: 194 U/L — ABNORMAL HIGH (ref 15–41)
AST: 450 U/L — ABNORMAL HIGH (ref 15–41)
AST: 777 U/L — ABNORMAL HIGH (ref 15–41)
Albumin: 2.4 g/dL — ABNORMAL LOW (ref 3.5–5.0)
Albumin: 1.9 g/dL — ABNORMAL LOW (ref 3.5–5.0)
Albumin: 1.8 g/dL — ABNORMAL LOW (ref 3.5–5.0)
Alkaline Phosphatase: 429 U/L — ABNORMAL HIGH (ref 38–126)
Alkaline Phosphatase: 348 U/L — ABNORMAL HIGH (ref 38–126)
Alkaline Phosphatase: 304 U/L — ABNORMAL HIGH (ref 38–126)
Anion gap: 11 (ref 5–15)
Anion gap: 16 — ABNORMAL HIGH (ref 5–15)
Anion gap: 17 — ABNORMAL HIGH (ref 5–15)
BUN: 57 mg/dL — ABNORMAL HIGH (ref 8–23)
BUN: 59 mg/dL — ABNORMAL HIGH (ref 8–23)
BUN: 62 mg/dL — ABNORMAL HIGH (ref 8–23)
CO2: 21 mmol/L — ABNORMAL LOW (ref 22–32)
CO2: 15 mmol/L — ABNORMAL LOW (ref 22–32)
CO2: 13 mmol/L — ABNORMAL LOW (ref 22–32)
Calcium: 7.8 mg/dL — ABNORMAL LOW (ref 8.9–10.3)
Calcium: 7.3 mg/dL — ABNORMAL LOW (ref 8.9–10.3)
Calcium: 6.8 mg/dL — ABNORMAL LOW (ref 8.9–10.3)
Chloride: 102 mmol/L (ref 98–111)
Chloride: 104 mmol/L (ref 98–111)
Chloride: 104 mmol/L (ref 98–111)
Creatinine, Ser: 4.73 mg/dL — ABNORMAL HIGH (ref 0.61–1.24)
Creatinine, Ser: 5.21 mg/dL — ABNORMAL HIGH (ref 0.61–1.24)
Creatinine, Ser: 5.24 mg/dL — ABNORMAL HIGH (ref 0.61–1.24)
GFR, Estimated: 12 mL/min — ABNORMAL LOW (ref >60)
GFR, Estimated: 11 mL/min — ABNORMAL LOW (ref >60)
GFR, Estimated: 11 mL/min — ABNORMAL LOW (ref >60)
Glucose, Bld: 82 mg/dL (ref 70–99)
Glucose, Bld: 75 mg/dL (ref 70–99)
Glucose, Bld: 68 mg/dL — ABNORMAL LOW (ref 70–99)
Potassium: 3.3 mmol/L — ABNORMAL LOW (ref 3.5–5.1)
Potassium: 3.4 mmol/L — ABNORMAL LOW (ref 3.5–5.1)
Potassium: 3.9 mmol/L (ref 3.5–5.1)
Sodium: 134 mmol/L — ABNORMAL LOW (ref 135–145)
Sodium: 135 mmol/L (ref 135–145)
Sodium: 134 mmol/L — ABNORMAL LOW (ref 135–145)
Total Bilirubin: 3.6 mg/dL — ABNORMAL HIGH (ref 0.0–1.2)
Total Bilirubin: 4.3 mg/dL — ABNORMAL HIGH (ref 0.0–1.2)
Total Bilirubin: 3.8 mg/dL — ABNORMAL HIGH (ref 0.0–1.2)
Total Protein: 5.8 g/dL — ABNORMAL LOW (ref 6.5–8.1)
Total Protein: 4.9 g/dL — ABNORMAL LOW (ref 6.5–8.1)
Total Protein: 4.9 g/dL — ABNORMAL LOW (ref 6.5–8.1)

## 2023-10-13 LAB — CBC WITH DIFFERENTIAL/PLATELET
Abs Immature Granulocytes: 0.54 K/uL — ABNORMAL HIGH (ref 0.00–0.07)
Basophils Absolute: 0 K/uL (ref 0.0–0.1)
Basophils Relative: 0 %
Eosinophils Absolute: 0 K/uL (ref 0.0–0.5)
Eosinophils Relative: 0 %
HCT: 25.5 % — ABNORMAL LOW (ref 39.0–52.0)
Hemoglobin: 8.3 g/dL — ABNORMAL LOW (ref 13.0–17.0)
Immature Granulocytes: 2 %
Lymphocytes Relative: 1 %
Lymphs Abs: 0.1 K/uL — ABNORMAL LOW (ref 0.7–4.0)
MCH: 30.5 pg (ref 26.0–34.0)
MCHC: 32.5 g/dL (ref 30.0–36.0)
MCV: 93.8 fL (ref 80.0–100.0)
Monocytes Absolute: 0.7 K/uL (ref 0.1–1.0)
Monocytes Relative: 3 %
Neutro Abs: 20.9 K/uL — ABNORMAL HIGH (ref 1.7–7.7)
Neutrophils Relative %: 94 %
Platelets: 136 K/uL — ABNORMAL LOW (ref 150–400)
RBC: 2.72 MIL/uL — ABNORMAL LOW (ref 4.22–5.81)
RDW: 16.8 % — ABNORMAL HIGH (ref 11.5–15.5)
Smear Review: NORMAL
WBC: 22.2 K/uL — ABNORMAL HIGH (ref 4.0–10.5)
nRBC: 0 % (ref 0.0–0.2)

## 2023-10-13 LAB — BLOOD GAS, ARTERIAL
Acid-base deficit: 10.1 mmol/L — ABNORMAL HIGH (ref 0.0–2.0)
Acid-base deficit: 7.9 mmol/L — ABNORMAL HIGH (ref 0.0–2.0)
Acid-base deficit: 11.8 mmol/L — ABNORMAL HIGH (ref 0.0–2.0)
Bicarbonate: 14.2 mmol/L — ABNORMAL LOW (ref 20.0–28.0)
Bicarbonate: 16.9 mmol/L — ABNORMAL LOW (ref 20.0–28.0)
Bicarbonate: 11.9 mmol/L — ABNORMAL LOW (ref 20.0–28.0)
FIO2: 60 %
FIO2: 40 %
MECHVT: 500 mL
MECHVT: 500 mL
Mechanical Rate: 30
Mechanical Rate: 30
O2 Saturation: 96.7 %
O2 Saturation: 99 %
O2 Saturation: 99.3 %
PEEP: 5 cmH2O
PEEP: 5 cmH2O
Patient temperature: 37
Patient temperature: 37
Patient temperature: 37
pCO2 arterial: 27 mmHg — ABNORMAL LOW (ref 32–48)
pCO2 arterial: 32 mmHg (ref 32–48)
pCO2 arterial: 22 mmHg — ABNORMAL LOW (ref 32–48)
pH, Arterial: 7.33 — ABNORMAL LOW (ref 7.35–7.45)
pH, Arterial: 7.33 — ABNORMAL LOW (ref 7.35–7.45)
pH, Arterial: 7.34 — ABNORMAL LOW (ref 7.35–7.45)
pO2, Arterial: 75 mmHg — ABNORMAL LOW (ref 83–108)
pO2, Arterial: 105 mmHg (ref 83–108)
pO2, Arterial: 139 mmHg — ABNORMAL HIGH (ref 83–108)

## 2023-10-13 LAB — TECHNOLOGIST SMEAR REVIEW: Plt Morphology: NORMAL

## 2023-10-13 LAB — HEMOGLOBIN A1C
Hgb A1c MFr Bld: 4.7 % — ABNORMAL LOW (ref 4.8–5.6)
Mean Plasma Glucose: 88 mg/dL

## 2023-10-13 LAB — CBC
HCT: 28.8 % — ABNORMAL LOW (ref 39.0–52.0)
Hemoglobin: 9 g/dL — ABNORMAL LOW (ref 13.0–17.0)
MCH: 29.4 pg (ref 26.0–34.0)
MCHC: 31.3 g/dL (ref 30.0–36.0)
MCV: 94.1 fL (ref 80.0–100.0)
Platelets: 146 K/uL — ABNORMAL LOW (ref 150–400)
RBC: 3.06 MIL/uL — ABNORMAL LOW (ref 4.22–5.81)
RDW: 16.1 % — ABNORMAL HIGH (ref 11.5–15.5)
WBC: 7.7 K/uL (ref 4.0–10.5)
nRBC: 0 % (ref 0.0–0.2)

## 2023-10-13 LAB — LACTIC ACID, PLASMA
Lactic Acid, Venous: 4.9 mmol/L (ref 0.5–1.9)
Lactic Acid, Venous: 5.6 mmol/L (ref 0.5–1.9)
Lactic Acid, Venous: 7.5 mmol/L (ref 0.5–1.9)

## 2023-10-13 LAB — D-DIMER, QUANTITATIVE: D-Dimer, Quant: 0.48 ug{FEU}/mL (ref 0.00–0.50)

## 2023-10-13 LAB — GLUCOSE, CAPILLARY
Glucose-Capillary: 102 mg/dL — ABNORMAL HIGH (ref 70–99)
Glucose-Capillary: 102 mg/dL — ABNORMAL HIGH (ref 70–99)
Glucose-Capillary: 108 mg/dL — ABNORMAL HIGH (ref 70–99)
Glucose-Capillary: 125 mg/dL — ABNORMAL HIGH (ref 70–99)
Glucose-Capillary: 203 mg/dL — ABNORMAL HIGH (ref 70–99)
Glucose-Capillary: 53 mg/dL — ABNORMAL LOW (ref 70–99)
Glucose-Capillary: 58 mg/dL — ABNORMAL LOW (ref 70–99)
Glucose-Capillary: 64 mg/dL — ABNORMAL LOW (ref 70–99)
Glucose-Capillary: 73 mg/dL (ref 70–99)
Glucose-Capillary: 91 mg/dL (ref 70–99)
Glucose-Capillary: 94 mg/dL (ref 70–99)

## 2023-10-13 LAB — TROPONIN I (HIGH SENSITIVITY)
Troponin I (High Sensitivity): 312 ng/L (ref <18)
Troponin I (High Sensitivity): 431 ng/L (ref <18)
Troponin I (High Sensitivity): 672 ng/L (ref <18)

## 2023-10-13 LAB — BRAIN NATRIURETIC PEPTIDE: B Natriuretic Peptide: 4188.6 pg/mL — ABNORMAL HIGH (ref 0.0–100.0)

## 2023-10-13 LAB — CORTISOL: Cortisol, Plasma: 46.8 ug/dL

## 2023-10-13 LAB — MRSA NEXT GEN BY PCR, NASAL: MRSA by PCR Next Gen: NOT DETECTED

## 2023-10-13 LAB — FIBRINOGEN: Fibrinogen: 373 mg/dL (ref 210–475)

## 2023-10-13 LAB — PROTIME-INR
INR: 2.9 — ABNORMAL HIGH (ref 0.8–1.2)
Prothrombin Time: 31.7 s — ABNORMAL HIGH (ref 11.4–15.2)

## 2023-10-13 SURGERY — ERCP, WITH INTERVENTION IF INDICATED
Anesthesia: General

## 2023-10-13 MED ORDER — VANCOMYCIN VARIABLE DOSE PER UNSTABLE RENAL FUNCTION (PHARMACIST DOSING): Status: DC

## 2023-10-13 MED ORDER — STERILE WATER FOR INJECTION IV SOLN
INTRAVENOUS | Status: DC
  Filled 2023-10-13: qty 150
  Filled 2023-10-13: qty 1000

## 2023-10-13 MED ORDER — SODIUM BICARBONATE 8.4 % IV SOLN: 50.0000 meq | INTRAVENOUS | Status: AC

## 2023-10-13 MED ORDER — DICLOFENAC SUPPOSITORY 100 MG
RECTAL | Status: AC
  Filled 2023-10-13: qty 1

## 2023-10-13 MED ORDER — LACTATED RINGERS IV SOLN: INTRAVENOUS | Status: DC

## 2023-10-13 MED ORDER — VASOPRESSIN 20 UNITS/100 ML INFUSION FOR SHOCK
0.0000 [IU]/min | INTRAVENOUS | Status: DC
  Administered 2023-10-13 (×2): 0.03 [IU]/min via INTRAVENOUS
  Filled 2023-10-13 (×2): qty 100

## 2023-10-13 MED ORDER — ONDANSETRON HCL 4 MG/2ML IJ SOLN
INTRAMUSCULAR | Status: DC | PRN
  Administered 2023-10-13: 4 mg via INTRAVENOUS

## 2023-10-13 MED ORDER — SODIUM CHLORIDE 0.9 % IV BOLUS
1000.0000 mL | Freq: Once | INTRAVENOUS | Status: AC
  Administered 2023-10-13: 1000 mL via INTRAVENOUS

## 2023-10-13 MED ORDER — DEXTROSE 50 % IV SOLN
INTRAVENOUS | Status: AC
  Filled 2023-10-13: qty 50

## 2023-10-13 MED ORDER — ONDANSETRON HCL 4 MG/2ML IJ SOLN
INTRAMUSCULAR | Status: AC
  Filled 2023-10-13: qty 2

## 2023-10-13 MED ORDER — SODIUM BICARBONATE 8.4 % IV SOLN
INTRAVENOUS | Status: AC
  Administered 2023-10-13: 50 meq via INTRAVENOUS
  Filled 2023-10-13: qty 50

## 2023-10-13 MED ORDER — SUCCINYLCHOLINE CHLORIDE 200 MG/10ML IV SOSY
PREFILLED_SYRINGE | INTRAVENOUS | Status: AC
  Filled 2023-10-13: qty 10

## 2023-10-13 MED ORDER — MIDAZOLAM HCL 2 MG/2ML IJ SOLN
INTRAMUSCULAR | Status: DC | PRN
  Administered 2023-10-13 (×2): 2 mg via INTRAVENOUS

## 2023-10-13 MED ORDER — GLYCOPYRROLATE 0.2 MG/ML IJ SOLN
INTRAMUSCULAR | Status: AC
  Filled 2023-10-13: qty 1

## 2023-10-13 MED ORDER — SUCCINYLCHOLINE CHLORIDE 200 MG/10ML IV SOSY
PREFILLED_SYRINGE | INTRAVENOUS | Status: DC | PRN
  Administered 2023-10-13: 80 mg via INTRAVENOUS

## 2023-10-13 MED ORDER — NOREPINEPHRINE 4 MG/250ML-% IV SOLN
0.0000 ug/min | INTRAVENOUS | Status: DC
  Administered 2023-10-13: 4 ug/min via INTRAVENOUS
  Filled 2023-10-13: qty 250

## 2023-10-13 MED ORDER — SODIUM CHLORIDE 0.9 % IV SOLN: 250.0000 mL | INTRAVENOUS | Status: DC

## 2023-10-13 MED ORDER — MIDAZOLAM HCL 2 MG/2ML IJ SOLN
INTRAMUSCULAR | Status: AC
  Filled 2023-10-13: qty 2

## 2023-10-13 MED ORDER — CHLORHEXIDINE GLUCONATE CLOTH 2 % EX PADS
6.0000 | MEDICATED_PAD | Freq: Every day | CUTANEOUS | Status: DC
  Administered 2023-10-13: 6 via TOPICAL

## 2023-10-13 MED ORDER — SODIUM BICARBONATE 8.4 % IV SOLN
INTRAVENOUS | Status: DC
  Filled 2023-10-13 (×2): qty 1000
  Filled 2023-10-13 (×2): qty 150

## 2023-10-13 MED ORDER — NOREPINEPHRINE 16 MG/250ML-% IV SOLN
0.0000 ug/min | INTRAVENOUS | Status: DC
  Administered 2023-10-13: 3 ug/min via INTRAVENOUS
  Administered 2023-10-13: 25 ug/min via INTRAVENOUS
  Administered 2023-10-14: 15 ug/min via INTRAVENOUS
  Filled 2023-10-13 (×2): qty 250

## 2023-10-13 MED ORDER — PROPOFOL 500 MG/50ML IV EMUL
INTRAVENOUS | Status: DC | PRN
  Administered 2023-10-13: 25 ug/kg/min via INTRAVENOUS

## 2023-10-13 MED ORDER — DICLOFENAC SUPPOSITORY 100 MG
100.0000 mg | Freq: Once | RECTAL | Status: AC
  Administered 2023-10-13: 100 mg via RECTAL
  Filled 2023-10-13: qty 1

## 2023-10-13 MED ORDER — DEXTROSE 50 % IV SOLN: 12.5000 g | INTRAVENOUS | Status: AC

## 2023-10-13 MED ORDER — ROCURONIUM BROMIDE 100 MG/10ML IV SOLN
INTRAVENOUS | Status: DC | PRN
  Administered 2023-10-13: 30 mg via INTRAVENOUS

## 2023-10-13 MED ORDER — HYDROCORTISONE SOD SUC (PF) 100 MG IJ SOLR
50.0000 mg | Freq: Four times a day (QID) | INTRAMUSCULAR | Status: DC
  Administered 2023-10-13 – 2023-10-14 (×3): 50 mg via INTRAVENOUS
  Filled 2023-10-13 (×3): qty 2

## 2023-10-13 MED ORDER — PROPOFOL 10 MG/ML IV BOLUS
INTRAVENOUS | Status: DC | PRN
  Administered 2023-10-13: 70 mg via INTRAVENOUS

## 2023-10-13 MED ORDER — DEXTROSE 50 % IV SOLN
INTRAVENOUS | Status: AC
  Administered 2023-10-13: 12.5 g via INTRAVENOUS
  Filled 2023-10-13: qty 50

## 2023-10-13 MED ORDER — DEXTROSE 50 % IV SOLN
12.5000 g | INTRAVENOUS | Status: AC
  Administered 2023-10-13: 12.5 g via INTRAVENOUS
  Filled 2023-10-13: qty 50

## 2023-10-13 MED ORDER — VANCOMYCIN HCL 1250 MG/250ML IV SOLN
1250.0000 mg | Freq: Once | INTRAVENOUS | Status: AC
  Administered 2023-10-13: 1250 mg via INTRAVENOUS
  Filled 2023-10-13: qty 250

## 2023-10-13 NOTE — Progress Notes (Signed)
 I have spoken to the family, anesthesia and the ICU team about this patient's cholangitis and need for stent placement and drainage.  The patient's ejection fraction has been noted to be 15% and myself and anesthesia have discussed with the patient the possibility of a better outcome including death and dependency on ventilator postprocedure.  The other complications such as bleeding, perforation, pancreatitis and death have been explained to the patient's son and wife.  They agreed to proceeding with the ERCP.

## 2023-10-13 NOTE — Anesthesia Procedure Notes (Signed)
 Procedure Name: Intubation Date/Time: 10/13/2023 3:55 PM  Performed by: Leontine Katz, CRNAPre-anesthesia Checklist: Patient identified, Patient being monitored, Timeout performed, Emergency Drugs available and Suction available Patient Re-evaluated:Patient Re-evaluated prior to induction Oxygen Delivery Method: Circle system utilized Preoxygenation: Pre-oxygenation with 100% oxygen Induction Type: IV induction and Rapid sequence Ventilation: Mask ventilation without difficulty Laryngoscope Size: 3 and McGrath Grade View: Grade I Tube type: Oral Tube size: 7.0 mm Number of attempts: 1 Airway Equipment and Method: Stylet Placement Confirmation: ETT inserted through vocal cords under direct vision, positive ETCO2 and breath sounds checked- equal and bilateral Secured at: 21 cm Tube secured with: Tape Dental Injury: Teeth and Oropharynx as per pre-operative assessment

## 2023-10-13 NOTE — TOC Initial Note (Signed)
 Transition of Care [facility]) - Initial/Assessment Note    Patient Details  Name: Larry Mann MRN: [id number] Date of Birth: 08/29/1950  [facility]) CM/SW Contact:    Dalia GORMAN Fuse, RN Phone Number: 10/13/2023, 9:48 AM  Clinical Narrative:                  Patient admitted with  billiary stone. GI Consulted for ERCP. The patient is from home with no needs, please outreach if [facility] needs are identified.        Patient Goals and CMS Choice            Expected Discharge Plan and Services                                              Prior Living Arrangements/Services                       Activities of Daily Living      Permission Sought/Granted                  Emotional Assessment              Admission diagnosis:  Choledocholithiasis [K80.50] Chronic congestive heart failure, unspecified heart failure type (HCC) [I50.9] Type 2 diabetes mellitus without complication, unspecified whether long term insulin  use (HCC) [E11.9] Patient Active Problem List   Diagnosis Date Noted   Choledocholithiasis 10/12/2023   Severe sepsis (HCC) 10/10/2019   Acute renal failure superimposed on stage 3b chronic kidney disease (HCC) 10/10/2019   Coronary artery disease    Cardiomyopathy (HCC)    Elevated troponin    Diabetes mellitus (HCC) 11/14/2016   Diabetes mellitus type 2, uncomplicated (HCC) 11/14/2016   HTN (hypertension) 11/14/2016   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HCC) 10/17/2016   Benign essential hypertension 11/10/2014   Bladder cancer (HCC) 10/18/2013   PCP:  Valora Agent, MD Pharmacy:   [id number] [place], [place] - [address] AT [facility] [address] [place] [address] Phone: [phone] Fax: [phone]     Social Drivers of Health (SDOH) Social History: SDOH Screenings   Food Insecurity: No Food Insecurity (10/13/2023)  Housing: Low Risk   (10/13/2023)  Transportation Needs: No Transportation Needs (10/13/2023)  Utilities: Not At Risk (10/13/2023)  Financial Resource Strain: Low Risk  (10/01/2023)   Received from [facility]  Social Connections: Socially Integrated (10/13/2023)  Tobacco Use: Medium Risk (10/12/2023)  Health Literacy: Unknown (12/20/2021)   Received from [facility]   SDOH Interventions:     Readmission Risk Interventions     No data to display

## 2023-10-13 NOTE — Progress Notes (Signed)
 Sent secure chat to Dr. Dorinda.. Requested him to come to bedside. Pts Blood glucose checked -53! Gave 1 amp. D50. Blood sugar rechecked 208! Called a rapid around [phone]. Pt placed in trending position. VS rechecked and were lower. Fluid bolus started. MD at bedside. Patient transferred out to the ICU. Wife at bedside and chaplain.   10/13/23 0916  Vitals  Temp 99.8 F (37.7 C)  Temp Source Oral  BP (!) 71/40  MAP (mmHg) (!) 51  BP Location Right Leg  BP Method Automatic  Patient Position (if appropriate) Lying  Pulse Rate (!) 152  Pulse Rate Source Monitor  MEWS COLOR  MEWS Score Color Red  MEWS Score  MEWS Temp 0  MEWS Systolic 2  MEWS Pulse 3  MEWS RR 0  MEWS LOC 1  MEWS Score 6

## 2023-10-13 NOTE — Progress Notes (Signed)
   10/13/23 0933  Spiritual Encounters  Type of Visit Initial  Care provided to: Pt and family (Wife and Pt's Pastor at bedside;)  [place] partners present during encounter Other (comment) (Rapid Response Team)  Referral source Code page  Reason for visit Code  OnCall Visit No  Spiritual Framework  Presenting Themes Goals in life/care;Impactful experiences and emotions  Patient Stress Factors Exhausted;Other (Comment) (According to wife; Pt has been battling many health issues the last few years.)  Family Stress Factors Exhausted;Other (Comment) (Wife is being asked to make hard decisions; today is Wife's 71th birthday.)  Goals  Clinical Care Goals Wife stated that Pt has DNR, DNI and does not want to go on Dialysis. I mentioned to her and her son that Comfort Care was an option to discuss w/the Dr.The family didn't seem open to it at this time.  Interventions  Spiritual Care Interventions Made Established relationship of care and support;Compassionate presence;Reflective listening;Normalization of emotions;Explored values/beliefs/practices/strengths;Other (comment) (for Family)  Intervention Outcomes  Outcomes Connection to spiritual care;Awareness around self/spiritual resourses;Connection to values and goals of care;Awareness of support;Other (comment) (for Family)

## 2023-10-13 NOTE — Procedures (Signed)
 Central Venous Catheter Insertion Procedure Note  [facility]  [id number]  Sep 09, 1950  Date:10/13/23  Time:12:16 PM   Provider Performing:Jalie Eiland   Procedure: Insertion of Non-tunneled Central Venous [phone]) with US  guidance ([id number])   Indication(s) Medication administration  Consent Risks of the procedure as well as the alternatives and risks of each were explained to the patient and/or caregiver.  Consent for the procedure was obtained and is signed in the bedside chart  Anesthesia Topical only with 1% lidocaine    Timeout Verified patient identification, verified procedure, site/side was marked, verified correct patient position, special equipment/implants available, medications/allergies/relevant history reviewed, required imaging and test results available.  Sterile Technique Maximal sterile technique including full sterile barrier drape, hand hygiene, sterile gown, sterile gloves, mask, hair covering, sterile ultrasound probe cover (if used).  Procedure Description Area of catheter insertion was cleaned with chlorhexidine  and draped in sterile fashion.  With real-time ultrasound guidance a central venous catheter was placed into the left internal jugular vein. Nonpulsatile blood flow and easy flushing noted in all ports.  The catheter was sutured in place and sterile dressing applied.  Complications/Tolerance None; patient tolerated the procedure well. Chest X-ray is ordered to verify placement for internal jugular or subclavian cannulation.   Chest x-ray is not ordered for femoral cannulation.  EBL Minimal  Specimen(s) None  Larry November, MD [facility] 10/13/2023 12:17 PM

## 2023-10-13 NOTE — Plan of Care (Signed)
  Problem: Metabolic: Goal: Ability to maintain appropriate glucose levels will improve Outcome: Progressing   Problem: Skin Integrity: Goal: Risk for impaired skin integrity will decrease Outcome: Progressing   Problem: Activity: Goal: Risk for activity intolerance will decrease Outcome: Progressing   Problem: Pain Managment: Goal: General experience of comfort will improve and/or be controlled Outcome: Progressing   Problem: Safety: Goal: Ability to remain free from injury will improve Outcome: Progressing   Problem: Skin Integrity: Goal: Risk for impaired skin integrity will decrease Outcome: Progressing

## 2023-10-13 NOTE — Progress Notes (Signed)
 Pharmacy Antibiotic Note  Larry Mann is a 73 y.o. male admitted on 10/12/2023 with sepsis. Patient presented with a chief complaint of worsening abdominal pain nausea/vomiting. Patient has a PMH of bladder cancer s/p ileostomy not on treatment, followed by palliative care, chronic pain on opioid therapy, chronic HFrEF, CKD stage 4/5, CAD, HTN, type 2 diabetes.   8/4: Patient had a rapid response called due to being hypotensive and tachycardia. Patient was minimally responsive. Concern for sepsis due to poor response. Patient has CKD stage 4/5, but not currently on HD outpatient. Possibly will assess if HD will be started.  Pharmacy has been consulted for Vancomycin  dosing.   Plan: Give Vancomycin  1250 mg once.  Follow up on Scr in the AM for further vancomycin  dosing and the need to check a random vancomycin  level Continue Zosyn  3.375 g  Continue to monitor renal function, clinical status, culture data, and antibiotic length of therapy Follow up on MRSA PCR  Height: 5' 6 (167.6 cm) Weight: 59 kg (130 lb) IBW/kg (Calculated) : 63.8  Temp (24hrs), Avg:98.4 F (36.9 C), Min:97.5 F (36.4 C), Max:99.8 F (37.7 C)  Recent Labs  Lab 10/12/23 2005 10/13/23 0452  WBC 11.0* 7.7  CREATININE 4.98* 4.73*    Estimated Creatinine Clearance: 12 mL/min (A) (by C-G formula based on SCr of 4.73 mg/dL (H)).    Allergies  Allergen Reactions   Compazine [Prochlorperazine] Other (See Comments)    Hallucinations per pt I get naked and want to pray to everyone    Antimicrobials this admission: Zosyn  3.375 gm q12h 8/3 >>  Vancomycin  1250 mg 8/4 >>   Dose adjustments this admission: None   Microbiology results: 8/2 UCx: growing > 100,000 gram-negative rods 8/4 MRSA PCR: ordered  Thank you for allowing pharmacy to be a part of this patient's care.  Ransom Blanch PGY-1 Pharmacy Resident  [facility]  10/13/2023 10:54 AM

## 2023-10-13 NOTE — Consult Note (Signed)
 Rogelia Copping, MD [facility]  [address] [place], [place] [address] Phone: [phone] Fax : [phone]  Consultation  Referring Provider:     Dr. Drue Primary Care Physician:  Valora Agent, MD Primary Gastroenterologist:  Dr. Aundria         Reason for Consultation:     Choledocholithiasis and cholangitis  Date of Admission:  10/12/2023 Date of Consultation:  10/13/2023         HPI:   Larry Mann is a 73 y.o. male with a history of bladder cancer and an ileal conduit.  The patient has been following up with [facility] clinic for cirrhosis of the liver.  He has been reported to not only have cirrhosis but also have ascites.  The patient was last seen in 2023 at the [facility] for his cirrhosis.  The patient also was reported to be on Eliquis  and his last dose was yesterday afternoon.  The patient was admitted after being seen in urgent care a few days ago for confusion and possible urinary tract infection.  The patient had liver enzymes checked that showed:  Component     Latest Ref Rng 07/26/2023 10/12/2023 10/13/2023  Total Protein     6.5 - 8.1 g/dL 7.6  7.0  5.8 (L)   Albumin     3.5 - 5.0 g/dL 3.3 (L)  3.1 (L)  2.4 (L)   AST     15 - 41 U/L 31  139 (H)  194 (H)   ALT     0 - 44 U/L 22  50 (H)  61 (H)   Alkaline Phosphatase     38 - 126 U/L 77  514 (H)  429 (H)   Total Bilirubin     0.0 - 1.2 mg/dL 0.6  2.8 (H)  3.6 (H)    The patient underwent a CT scan of the abdomen yesterday that showed::  IMPRESSION: 1. 8 mm distal obstructing common bile duct stone with moderate common bile duct dilatation. 2. Cholelithiasis and moderate distention of the gallbladder. 3. Significant and chronic appearing bilateral hydronephrosis with renal cortical thinning and scarring. There is also bilateral hydroureter down to a retroperitoneal mass measuring approximately 5.6 x 3.1 cm. This could be recurrent bladder cancer adenopathy. 4. Status post cystectomy with urinary  diversion and right lower quadrant ileostomy. 5. Moderate-sized peristomal hernia. 6. Periumbilical abdominal wall hernia containing fat and bowel but no evidence of obstruction or incarceration. 7. Severe descending and sigmoid colon diverticulosis without findings for acute diverticulitis. 8. Advanced atherosclerotic calcification involving the aorta and branch vessels. 9. Aortic atherosclerosis.  Due to the 8 mm distal obstructing common bile duct stone a GI consultation was called.  His white cell count on admission was elevated at 11.0 but has come down to 7.7.  The patient has a history of thrombocytopenia with his platelets at 120 but on admission they were up to 214 and this morning were down to 146.  During this admission the patient's blood pressure has been low with his most recent blood pressures systolics of 70s with the diastolics of 50s.  Past Medical History:  Diagnosis Date   Anginal pain (HCC)    Basal cell carcinoma 06/20/2009   R nose supratip   Bladder cancer (HCC)    Cancer (HCC)    Chicken pox    Chronic kidney disease    Coronary artery disease    Diabetes mellitus without complication (HCC)    Headache  Hypertension    Myocardial infarction (HCC)    Plantar fasciitis    Prostate cancer [facility])     Past Surgical History:  Procedure Laterality Date   CARDIOVERSION N/A 04/23/2017   Procedure: CARDIOVERSION;  Surgeon: Hester Wolm PARAS, MD;  Location: [facility] ORS;  Service: Cardiovascular;  Laterality: N/A;   CARDIOVERSION N/A 03/27/2018   Procedure: CARDIOVERSION (CATH LAB);  Surgeon: Hester Wolm PARAS, MD;  Location: [facility] ORS;  Service: Cardiovascular;  Laterality: N/A;   COLONOSCOPY     COLONOSCOPY WITH PROPOFOL  N/A 10/27/2014   Procedure: COLONOSCOPY WITH PROPOFOL ;  Surgeon: Lamar ONEIDA Holmes, MD;  Location: [facility] ENDOSCOPY;  Service: Endoscopy;  Laterality: N/A;   CORONARY ANGIOPLASTY     CYSTECTOMY     HERNIA REPAIR     LITHOTRIPSY     MOHS SURGERY      PROSTATE SURGERY      Prior to Admission medications   Medication Sig Start Date End Date Taking? Authorizing Provider  amiodarone (PACERONE) 200 MG tablet Take 1 tablet by mouth daily. 12/19/22 12/19/23 Yes [provider]  atorvastatin  (LIPITOR) 10 MG tablet Take 10 mg by mouth daily.   Yes [provider]  cephALEXin  (KEFLEX ) 500 MG capsule Take 1 capsule (500 mg total) by mouth 4 (four) times daily. 10/11/23  Yes White, Adrienne R, NP  Cholecalciferol (VITAMIN D-1000 MAX ST) 25 MCG (1000 UT) tablet Take 1 tablet by mouth daily. 12/07/21  Yes [provider]  ELIQUIS  5 MG TABS tablet Take 5 mg by mouth 2 (two) times daily. 03/24/17  Yes [provider]  fentaNYL  (DURAGESIC ) 50 MCG/HR Place 1 patch onto the skin every 3 (three) days. 10/02/23 11/01/23 Yes [provider]  hydrALAZINE  (APRESOLINE ) 25 MG tablet Take 25 mg by mouth 3 (three) times daily.   Yes [provider]  isosorbide dinitrate (ISORDIL) 10 MG tablet Take 10 mg by mouth 3 (three) times daily.   Yes [provider]  lactulose, encephalopathy, (CHRONULAC) 10 GM/15ML SOLN Take 10 g by mouth 2 (two) times daily. 07/29/23 09/22/24 Yes [provider]  metoCLOPramide (REGLAN) 5 MG tablet Take 5 mg by mouth every 6 (six) hours as needed. 08/15/23 08/14/24 Yes [provider]  ondansetron  (ZOFRAN ) 4 MG tablet Take 4 mg by mouth every 6 (six) hours as needed for nausea or vomiting. 07/17/23  Yes [provider]  oxyCODONE  (ROXICODONE ) 15 MG immediate release tablet Take 15 mg by mouth. 09/23/23  Yes [provider]  polyethylene glycol (MIRALAX / GLYCOLAX) 17 g packet Take 17 g by mouth daily. 07/17/23  Yes [provider]  potassium chloride  (KLOR-CON ) 10 MEQ tablet Take 10 mEq by mouth daily. 07/29/23 10/27/23 Yes [provider]  senna (SENOKOT) 8.6 MG tablet Take 2 tablets by mouth 2 (two) times daily. 07/17/23  Yes [provider]  sodium bicarbonate  650 MG tablet Take by mouth 2 (two) times daily.   Yes [provider]  torsemide (DEMADEX) 20 MG tablet Take 40 mg by mouth 2 (two) times daily. 02/12/20  Yes [provider]  vitamin B-12 (CYANOCOBALAMIN) 1000 MCG tablet Take 1,000 mcg by mouth daily. 09/02/19  Yes [provider]  amLODipine (NORVASC) 10 MG tablet AMLODIPINE BESYLATE 10 MG TABS Patient not taking: Reported on 10/12/2023 03/08/13   [provider]  atorvastatin  (LIPITOR) 20 MG tablet Take 10 mg by mouth daily.  Patient not taking: Reported on 10/12/2023    [provider]  clobetasol  cream (  TEMOVATE ) 0.05 % Apply twice daily to affected areas as needed for itching. Avoid applying to face, groin, and axilla Patient not taking: Reported on 10/12/2023 01/08/23   Hester Alm BROCKS, MD  diazepam (VALIUM) 5 MG tablet SMARTSIG:1-2 Tablet(s) By Mouth Patient not taking: Reported on 10/12/2023 05/14/23   [provider]  fentaNYL  (DURAGESIC ) 25 MCG/HR Place 1 patch onto the skin. Patient not taking: Reported on 10/12/2023    [provider]  glipiZIDE (GLUCOTROL XL) 5 MG 24 hr tablet Take 5 mg by mouth 2 (two) times daily.  Patient not taking: Reported on 10/12/2023    [provider]  metolazone (ZAROXOLYN) 2.5 MG tablet Take by mouth. Patient not taking: Reported on 10/12/2023    [provider]  mirtazapine (REMERON) 15 MG tablet Take 15 mg by mouth at bedtime. Patient not taking: Reported on 10/12/2023 08/18/23 08/17/24  [provider]  MOVANTIK 12.5 MG TABS tablet Take 12.5 mg by mouth daily. Patient not taking: Reported on 10/12/2023    [provider]  Oxycodone  HCl 10 MG TABS Take 10 mg by mouth. 07/17/23   [provider]  pantoprazole  (PROTONIX ) 20 MG tablet Take 20 mg by mouth daily. 10/27/20   [provider]  potassium chloride  20 MEQ/15ML (10%) SOLN Take 20 mEq by mouth daily. Patient not taking:  Reported on 10/12/2023 09/21/20   [provider]  traZODone (DESYREL) 50 MG tablet Take by mouth. Patient not taking: Reported on 10/12/2023 08/27/22   [provider]    Family History  Problem Relation Age of Onset   Multiple myeloma Mother      Social History   Tobacco Use   Smoking status: Former    Current packs/day: 0.00    Average packs/day: 1.5 packs/day for 25.0 years (37.5 ttl pk-yrs)    Types: Cigarettes    Start date: 05/19/1976    Quit date: 05/19/2001    Years since quitting: 22.4   Smokeless tobacco: Never  Vaping Use   Vaping status: Never Used  Substance Use Topics   Alcohol  use: Yes    Comment: occasional   Drug use: No    Allergies as of 10/12/2023 - Review Complete 10/12/2023  Allergen Reaction Noted   Compazine [prochlorperazine] Other (See Comments) 07/26/2023    Review of Systems:    All systems reviewed and negative except where noted in HPI.   Physical Exam:  Vital signs in last 24 hours: Temp:  [97.5 F (36.4 C)-100.8 F (38.2 C)] 100.8 F (38.2 C) (08/04 1030) Pulse Rate:  [56-152] 145 (08/04 1030) Resp:  [18-31] 31 (08/04 1030) BP: (66-159)/(40-126) 100/53 (08/04 1030) SpO2:  [95 %-100 %] 100 % (08/04 1030) Weight:  [59 kg] 59 kg (08/03 2005) Last BM Date : 10/12/23 General:   Ill appearing, lethargic Head:  Normocephalic and atraumatic. Eyes:   No icterus.   Conjunctiva pink. PERRLA. Rectal:  Not performed. Msk:  Symmetrical without gross deformities.    Extremities:  Without edema, cyanosis or clubbing.  LAB RESULTS: Recent Labs    10/12/23 2005 10/13/23 0452  WBC 11.0* 7.7  HGB 10.0* 9.0*  HCT 32.6* 28.8*  PLT 214 146*   BMET Recent Labs    10/12/23 2005 10/13/23 0452  NA 132* 134*  K 3.7 3.3*  CL 97* 102  CO2 23 21*  GLUCOSE 196* 82  BUN 60* 57*  CREATININE 4.98* 4.73*  CALCIUM  8.6* 7.8*   LFT Recent Labs    10/13/23 0452  PROT 5.8*  ALBUMIN 2.4*  AST 194*  ALT 61*  ALKPHOS 429*   BILITOT 3.6*   PT/INR No results for input(s): LABPROT, INR in the last 72 hours.  STUDIES: CT ABDOMEN PELVIS WO CONTRAST Result Date: 10/12/2023 CLINICAL DATA:  Acute abdominal pain. EXAM: CT ABDOMEN AND PELVIS WITHOUT CONTRAST TECHNIQUE: Multidetector CT imaging of the abdomen and pelvis was performed following the standard protocol without IV contrast. RADIATION DOSE REDUCTION: This exam was performed according to the departmental dose-optimization program which includes automated exposure control, adjustment of the mA and/or kV according to patient size and/or use of iterative reconstruction technique. COMPARISON:  CT scan from 2022 FINDINGS: Lower chest: The lung bases are clear of an acute process. No pulmonary lesions. The heart is normal in size for age. Pacer wires are in good position. Aortic and coronary artery calcifications noted. Hepatobiliary: No hepatic lesions are identified without contrast. No intrahepatic biliary dilatation. Gallbladder is mildly distended with a transverse diameter of 5 cm. There are small layering gallstones in the gallbladder. I do not see any gallbladder wall thickening or pericholecystic fluid to suggest acute cholecystitis. There is moderate common bile duct dilatation which measures up to 10 mm in the porta hepatis and there is a no mass, inflammation ductal dilatation. Moderate pancreatic atrophy. Distal obstructing common bile duct stone measuring 8 mm right at the ampulla. Pancreas: No mass, inflammation or ductal dilatation. Moderate pancreatic atrophy. Spleen: Borderline splenomegaly.  No splenic lesions. Adrenals/Urinary Tract: The adrenal glands are normal. Bilateral renal calculi noted with a staghorn type calculus in the upper pole region of the right kidney. Stable lateral interpolar right renal cyst. Significant and chronic appearing bilateral hydronephrosis with renal cortical thinning and scarring. There is also bilateral hydroureter down to a  retroperitoneal mass measuring approximately 5.6 x 3.1 cm. The patient has had a cystectomy suggesting bladder cancer with urinary diversion and right lower quadrant ileostomy. Moderate-sized peristomal hernia noted. There is also a periumbilical abdominal wall hernia containing fat and bowel but no evidence of obstruction or incarceration. Stomach/Bowel: The stomach, duodenum, small bowel and colon are grossly normal without contrast. No evidence for bowel obstruction. Severe descending and sigmoid colon diverticulosis noted. Vascular/Lymphatic: Advanced atherosclerotic calcification involving the aorta and branch vessels. No aneurysm. Borderline enlarged retroperitoneal lymph nodes measuring a maximum of 10 mm. No pelvic adenopathy or inguinal adenopathy. Surgical changes from right inguinal hernia repair with mesh. Reproductive: The prostate gland is small. Other: No free abdominal/free pelvic fluid collections or free air. Musculoskeletal: No significant bony findings. IMPRESSION: 1. 8 mm distal obstructing common bile duct stone with moderate common bile duct dilatation. 2. Cholelithiasis and moderate distention of the gallbladder. 3. Significant and chronic appearing bilateral hydronephrosis with renal cortical thinning and scarring. There is also bilateral hydroureter down to a retroperitoneal mass measuring approximately 5.6 x 3.1 cm. This could be recurrent bladder cancer adenopathy. 4. Status post cystectomy with urinary diversion and right lower quadrant ileostomy. 5. Moderate-sized peristomal hernia. 6. Periumbilical abdominal wall hernia containing fat and bowel but no evidence of obstruction or incarceration. 7. Severe descending and sigmoid colon diverticulosis without findings for acute diverticulitis. 8. Advanced atherosclerotic calcification involving the aorta and branch vessels. 9. Aortic atherosclerosis. Aortic Atherosclerosis (ICD10-I70.0). Electronically Signed   By: MYRTIS Stammer M.D.   On:  10/12/2023 21:56   US  Venous Img Lower Unilateral Left Result Date: 10/12/2023 EXAM: ULTRASOUND DUPLEX OF THE LEFT LOWER EXTREMITY VEINS TECHNIQUE: Duplex ultrasound using B-mode/gray scaled imaging and  Doppler spectral analysis and color flow was obtained of the deep venous structures of the left lower extremity. COMPARISON: None. CLINICAL HISTORY: LLE swelling, pain. FINDINGS: The visualized veins of the lower extremity are patent and free of echogenic thrombus. The veins demonstrate good compressibility with normal color flow study and spectral analysis. IMPRESSION: 1. No evidence of DVT. Electronically signed by: Norman Gatlin MD 10/12/2023 09:17 PM EDT RP Workstation: [id number]      Impression / Plan:   Assessment: Principal Problem:   Choledocholithiasis Active Problems:   Septic shock (HCC)   IFEANYICHUKWU WICKHAM is a 73 y.o. y/o male with elevated liver enzymes with a history of cirrhosis and cholangitis with admission to the ICU in septic shock.   Plan:  I have discussed the possibilities of doing an ERCP on this patient with the patient's wife and have also discussed with them about the possibility of a PTC.  The patient's last dose of Eliquis  was yesterday. The ICU team and I have discussed the case and if the patient can be stabilized with a reasonable blood pressure then we can bring him to the endoscopy unit for a ERCP.  If the patient cannot be stabilized then consideration for PTC should be entertained.  I have reached out to interventional radiology due to the patient's last dose of Eliquis  being yesterday.  The patient's family have been explained the plan and agree with it.  Thank you for involving me in the care of this patient.      LOS: 1 day   Rogelia Copping, MD, MD. NOLIA 10/13/2023, 1:07 PM,  Pager [phone] 7am-5pm  Check AMION for 5pm -7am coverage and on weekends   Note: This dictation was prepared with Dragon dictation along with smaller phrase technology. Any  transcriptional errors that result from this process are unintentional.

## 2023-10-13 NOTE — Transfer of Care (Signed)
 Immediate Anesthesia Transfer of Care Note  Patient: Larry Mann  Procedure(s) Performed: ERCP, WITH INTERVENTION IF INDICATED  Patient Location: ICU  Anesthesia Type:General  Level of Consciousness: Patient remains intubated per anesthesia plan  Airway & Oxygen Therapy: Patient remains intubated per anesthesia plan and Patient placed on Ventilator (see vital sign flow sheet for setting)  Post-op Assessment: Report given to RN and Post -op Vital signs reviewed and stable  Post vital signs: Reviewed  Last Vitals:  Vitals Value Taken Time  BP 154/74   Temp 97.28f   Pulse 147   Resp 16   SpO2 95     Last Pain:  Vitals:   10/13/23 1007  TempSrc: Oral  PainSc:          Complications: No notable events documented.

## 2023-10-13 NOTE — Progress Notes (Addendum)
 Progress Note   Patient: Larry Mann FMW:[id number] DOB: 10-19-1950 DOA: 10/12/2023     1 DOS: the patient was seen and examined on 10/13/2023   Brief hospital course: From HPI Larry Mann is a 73 y.o. male with medical history significant of bladder cancer s/p ileostomy not on treatment, followed by palliative care, chronic pain on opioid therapy, chronic HFrEF (echo on file from 2023 was EF 15% and grade III diastolic dysfunction), CKD stage 4/5, CAD, HTN, type 2 diabetes, follows at [facility] for his care who presented to the ED for evaluation of worsening abdominal pain and nausea/vomiting.  History primary obtained from ED records, chart review and patient's wife at bedside, due to patient lethargic and confused, unable to participate.  Patient's wife reports taking him to urgent care yesterday suspecting pt may have a UTI.  Pt had been acting unusual, confused for 2 mornings in a row, and urine in his ileostomy had foul odor.  Urgent care did a UA, sent for culture and started Keflex .  Today, patient developed worsening pain and persistent nausea/vomiting.  Wife reports pt has baseline lower abdominal pain from bladder mass and hernias, and he will intermittently have N/V, but nothing as severe and persistent as today.  Patient's usual pain regimen not controlling his abdominal pain.  Patient visibly shaking with chills, but no fevers noted at home.  Wife reports the shaking chills just started more recently.  Discussed code status with wife - she reports patient is DNR/DNI and follows with palliative care.  She is agreeable for procedures to address the current problem.     ED course -- Initial vitals HR 90, RR 18, BP 147/64, spO2 100% on room air, temp 97.6 F.  Labs obtained including CMP, CBC which were notable for sodium 132, chloride 97, glucose 196, BUN 60, Cr 4.98, Ca 8.6, albumin, 3.1, alk phos 514, AST 139, ALT 50, T bili 2.8, WBC 11.0, Hbg 10.0.  CT abdomen/pelvis showed an 8 mm distal  obstructing CBD stone with moderate common bile duct dilation, cholelithiasis with moderate gallbladder distention.  Other findings per report.   Doppler U/S of LEFT lower extremity negative for DVT.   Patient was treated with bolus IV fluids, Zosyn , IV dilaudid , IV Zofran , IV Protonix  and is being admitted for further evaluation and management as outlined in detail below.       Assessment and Plan:  Septic shock in a patient with Choledocholithiasis with possible cholangitis 8 mm obstructing CBD stone with moderate CBD dilation on CT abd/pelvis. Today patient became tachycardic, spiking temperature up to 102. Case discussed with general gastroenterologist Patient has been transferred to the ICU Continue broad-spectrum antibiotics Continue to monitor lactic acid Follow-up on blood cultures Monitor daily labs closely I have discussed the case with intensivist Follow-up on VQ scan    Chronic combined HFrEF Given 1 L bolus fluids in ED. Last echo in [facility] from 2023 showed EF 15%, grade III dd. Other than LLE edema (U/S ruled out DVT), appears overall euvolemic. Currently having septic shock Monitor daily weight as well as input and output   Hx of Bladder Cancer s/p ileostomy Followed by Palliative Care Not on treatment    CKD stage IV --Monitor closely --Avoid nephrotoxins & hypotension According to patient's family he does not want to be on dialysis   Type 2 diabetes Hypoglycemia Patient received a dose of D50 today   CAD - stable, no chest pain reported Holding antihypertensives at this time account  of septic shock      Advance Care Planning: Code status DNR/DNI Confirmed by patient's wife at bedside.   Consults: GI consulted, ICU on board   Family Communication: wife at bedside    Subjective:  Seen and examined at bedside this morning Minimally responsive Later had a drop in his systolic blood pressure and rapid response was called Found to be  tachycardic and later spiking temperature concerning for sepsis Septic bolus administered and patient was not responsive to IV fluid and therefore initiated on Levophed  Patient has subsequently been taken over by ICU team.   Physical Exam:  General exam: Patient altered unable to communicate awakens to pain HEENT: keeps eyes closed, moist mucus membranes, hearing grossly normal  Respiratory system: Decreased air entry bibasilarly Cardiovascular system: normal S1/S2, RRR, cardiac device present, Left > right lower extremity edema (left 3+ pitting).   Gastrointestinal system: soft, tenderness on palpation, ileostomy present Central nervous system: Patient orthodontically alert to pain Vitals:   10/13/23 0937 10/13/23 0949 10/13/23 1007 10/13/23 1030  BP: (!) 77/49 (!) 75/46 (!) 72/44 (!) 100/53  Pulse: (!) 128 (!) 130 (!) 136 (!) 145  Resp:   (!) 25 (!) 31  Temp:   98.4 F (36.9 C) (!) 100.8 F (38.2 C)  TempSrc:   Oral   SpO2: 100%  97% 100%  Weight:      Height:        Data Reviewed: Venous Doppler did not show any evidence of DVT Abdominal CT scan showed diverticulosis with no evidence of diverticulitis also showed findings of moderate dilation of the common bile duct with  calculi    Latest Ref Rng & Units 10/13/2023    1:17 PM 10/13/2023    4:52 AM 10/12/2023    8:05 PM  CBC  WBC 4.0 - 10.5 K/uL 22.2  7.7  11.0   Hemoglobin 13.0 - 17.0 g/dL 8.3  9.0  89.9   Hematocrit 39.0 - 52.0 % 25.5  28.8  32.6   Platelets 150 - 400 K/uL 136  146  214        Latest Ref Rng & Units 10/13/2023    4:52 AM 10/12/2023    8:05 PM 07/26/2023    8:35 AM  BMP  Glucose 70 - 99 mg/dL 82  803  60   BUN 8 - 23 mg/dL 57  60  44   Creatinine 0.61 - 1.24 mg/dL 5.26  5.01  6.00   Sodium 135 - 145 mmol/L 134  132  134   Potassium 3.5 - 5.1 mmol/L 3.3  3.7  3.1   Chloride 98 - 111 mmol/L 102  97  102   CO2 22 - 32 mmol/L 21  23  26    Calcium  8.9 - 10.3 mg/dL 7.8  8.6  8.3     Family Communication:  Discussed with patient's wife as well as son  Disposition: Pending clinical course Status is: Inpatient   I spent a total of 60 minutes of critical care time in running rapid response on patient initiating emergency transfer to ICU requiring vasopressor support as well as discussing with gastroenterologist, intensivist as well as patient's wife and son at bedside concerning goals of care.    Author: Drue ONEIDA Potter, MD 10/13/2023 1:55 PM  For on call review [email or web address].

## 2023-10-13 NOTE — ED Notes (Signed)
 1C was called and informed that pt is on the way at this time.

## 2023-10-13 NOTE — Significant Event (Signed)
 Responded to overhead page of rapid response in room 102. On arrival, pt minimally responsive, bed in trendelenberg. Pt with low bp and and tachycardia. Pt maintaining airway. Provider, RN, Lauraine Ada, RN ICU CN, RRT lead arrived.

## 2023-10-13 NOTE — Op Note (Signed)
 [facility] Gastroenterology Patient Name: Larry Mann Procedure Date: 10/13/2023 2:08 PM MRN: [id number] Account #: [id number] Date of Birth: 12-08-1950 Admit Type: Outpatient Age: 73 Room: [facility] ENDO ROOM 4 Gender: Male Note Status: Finalized Instrument Name: [id number] Procedure:             ERCP Indications:           Ascending cholangitis Providers:             Rogelia Copping MD, MD Referring MD:          Lynwood FALCON. Valora, MD (Referring MD) Medicines:             General Anesthesia Complications:         No immediate complications. Procedure:             Pre-Anesthesia Assessment:                        - Prior to the procedure, a History and Physical was                         performed, and patient medications and allergies were                         reviewed. The patient's tolerance of previous                         anesthesia was also reviewed. The risks and benefits                         of the procedure and the sedation options and risks                         were discussed with the patient. All questions were                         answered, and informed consent was obtained. Prior                         Anticoagulants: The patient has taken Eliquis                          (apixaban ), last dose was 1 day prior to procedure.                         ASA Grade Assessment: IV - A patient with severe                         systemic disease that is a constant threat to life.                         After reviewing the risks and benefits, the patient                         was deemed in satisfactory condition to undergo the                         procedure.  After obtaining informed consent, the scope was passed                         under direct vision. Throughout the procedure, the                         patient's blood pressure, pulse, and oxygen                         saturations were monitored continuously.  The                         Duodenoscope was introduced through the mouth, and                         used to inject contrast into and used to inject                         contrast into the bile duct. The ERCP was accomplished                         without difficulty. The patient tolerated the                         procedure well. Findings:      The scout film was normal. The major papilla was bulging. The bile duct       was deeply cannulated with the short-nosed traction sphincterotome.       Contrast was injected. I personally interpreted the bile duct images.       There was brisk flow of contrast through the ducts. Image quality was       excellent. Contrast extended to the entire biliary tree. The lower third       of the main bile duct contained stone(s). A wire was passed into the       biliary tree. One 7 Fr by 9 cm plastic stent with a single external flap       and a single internal flap was placed 9 cm into the common bile duct.       Pus flowed through the stent. The stent was in good position. Impression:            - The major papilla appeared to be bulging.                        - Choledocholithiasis was found. Removal was not                         attempted; a stent was inserted.                        - One plastic stent was placed into the common bile                         duct. Recommendation:        - Return patient to ICU for ongoing care.                        - Watch for pancreatitis, bleeding, perforation, and  cholangitis.                        - Repeat ERCP in 2 months to remove stent off                         anticoagulation. Procedure Code(s):     --- Professional ---                        [phone], Endoscopic retrograde cholangiopancreatography                         (ERCP); with placement of endoscopic stent into                         biliary or pancreatic duct, including pre- and                          post-dilation and guide wire passage, when performed,                         including sphincterotomy, when performed, each stent                        [id number], Endoscopic catheterization of the biliary                         ductal system, radiological supervision and                         interpretation Diagnosis Code(s):     --- Professional ---                        K83.09, Other cholangitis CPT copyright 2022 [employer]. All rights reserved. The codes documented in this report are preliminary and upon coder review may  be revised to meet current compliance requirements. Rogelia Copping MD, MD 10/13/2023 4:12:57 PM This report has been signed electronically. Number of Addenda: 0 Note Initiated On: 10/13/2023 2:08 PM Estimated Blood Loss:  Estimated blood loss: none.      [facility]

## 2023-10-13 NOTE — Consult Note (Signed)
 NAME:  Larry Mann, MRN:  [id number], DOB:  10/31/1950, LOS: 1 ADMISSION DATE:  10/12/2023, CONSULTATION DATE:  10/13/2023 REFERRING MD:  Dr. Dorinda, CHIEF COMPLAINT:  Septic Shock   Brief Pt Description / Synopsis:  73 y.o. 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  Course complicated by development of severe sepsis with septic shock due to presumed Cholangitis.  GI planning on ERCP.  History of Present Illness:  Larry Mann is a 73 y.o. male with medical history significant of bladder cancer s/p ileostomy not on treatment, followed by palliative care, chronic pain on opioid therapy, chronic HFrEF (echo on file from 2023 was EF 15% and grade III diastolic dysfunction), CKD stage 4/5, CAD, HTN, type 2 diabetes, follows at [facility] for his care who presented to the ED on 10/12/23 for evaluation of worsening abdominal pain and nausea/vomiting.    Patient is currently altered and unable to contribute to history, therefore history is obtained from chart review and patient's wife at bedside.  Patient's wife reports taking him to urgent care yesterday suspecting pt may have a UTI.  Pt had been acting unusual, confused for 2 mornings in a row, and urine in his ileostomy had foul odor.  Urgent care did a UA, sent for culture and started Keflex .  Today, patient developed worsening pain and persistent nausea/vomiting.  Wife reports pt has baseline lower abdominal pain from bladder mass and hernias, and he will intermittently have N/V, but nothing as severe and persistent as today.  Patient's usual pain regimen not controlling his abdominal pain.  Patient visibly shaking with chills, but no fevers noted at home.  Wife reports the shaking chills just started more recently.  Discussed code status with wife - she reports patient is DNR/DNI and follows with palliative care.  She is  agreeable for procedures to address the current problem.     ED Course: Initial Vital Signs: HR 90, RR 18, BP 147/64, spO2 100% on room air, temp 97.6 F  Significant Labs: sodium 132, chloride 97, glucose 196, BUN 60, Cr 4.98, Ca 8.6, albumin, 3.1, alk phos 514, AST 139, ALT 50, T bili 2.8, WBC 11.0, Hbg 10.0  Imaging CT Abdomen/Pelvis>> 8 mm distal obstructing CBD stone with moderate common bile duct dilation, cholelithiasis with moderate gallbladder distention.  Other findings per report. Doppler U/S of LEFT lower extremity negative for DVT.  Medications Administered:  bolus IV fluids, Zosyn , IV dilaudid , IV Zofran , IV Protonix    [facility] asked to admit for further workup and treatment.  Please see Significant Hospital Events section below for full detailed hospital course.   Pertinent  Medical History   Past Medical History:  Diagnosis Date   Anginal pain (HCC)    Basal cell carcinoma 06/20/2009   R nose supratip   Bladder cancer (HCC)    Cancer (HCC)    Chicken pox    Chronic kidney disease    Coronary artery disease    Diabetes mellitus without complication (HCC)    Headache    Hypertension    Myocardial infarction (HCC)    Plantar fasciitis    Prostate cancer (HCC)     Micro Data:  8/2: Urine>> gram - rods 8/4: MRSA PCR>> negative 8/4: Blood cultures x2>>  Antimicrobials:   Anti-infectives (From admission, onward)    Start     Dose/Rate Route Frequency Ordered Stop  10/13/23 1100  vancomycin  (VANCOREADY) IVPB 1250 mg/250 mL        1,250 mg 166.7 mL/hr over 90 Minutes Intravenous  Once 10/13/23 0954 10/13/23 1245   10/13/23 1055  vancomycin  variable dose per unstable renal function (pharmacist dosing)         Does not apply See admin instructions 10/13/23 1056     10/12/23 2330  piperacillin -tazobactam (ZOSYN ) IVPB 3.375 g        3.375 g 12.5 mL/hr over 240 Minutes Intravenous Every 12 hours 10/12/23 2328     10/12/23 2245  piperacillin -tazobactam (ZOSYN ) IVPB  3.375 g  Status:  Discontinued        3.375 g 100 mL/hr over 30 Minutes Intravenous  Once 10/12/23 2235 10/12/23 2328       Significant Hospital Events: Including procedures, antibiotic start and stop dates in addition to other pertinent events   8/3: Admitted by [facility] for Choledocholithiasis.  8/4: Rapid response called for hypotension, tachycardia, fever, AMS due to developing septic shock.  Transferred to ICU for vasopressors, PCCM consulted.  Central line and A-line placed.  GI planning on ERCP.  Interim History / Subjective:  As outlined above under Significant Hospital Events section  Objective   Blood pressure (!) 100/53, pulse (!) 145, temperature (!) 100.8 F (38.2 C), resp. rate (!) 31, height 5' 6 (1.676 m), weight 59 kg, SpO2 100%.        Intake/Output Summary (Last 24 hours) at 10/13/2023 1135 Last data filed at 10/13/2023 1055 Gross per 24 hour  Intake 1299.19 ml  Output 200 ml  Net 1099.19 ml   Filed Weights   10/12/23 2005  Weight: 59 kg    Examination: General: Critically ill appearing on chronically ill appearing frail elderly male, laying in bed, lethargic, in NAD HENT: Atraumatic, normocephalic, neck supple, no JVD, dry MM Lungs: Clear breath sounds with bibasilar crackles, even, mild tachypnea Cardiovascular: Tachycardia, regular rhythm, s1s2, no m/R/G Abdomen: Soft, Tender to palpation to RUQ, no guarding or rebound tenderness to indicate acute abdomen Extremities: normal bulk and tone, no deformities,  Neuro: Somnolent, arouses and moans to pain, does not follow command, PERRL GU: Foley catheter in place draining dark urine  Resolved Hospital Problem list     Assessment & Plan:   #Shock: Septic +/- Cardiogenic  #Tachycardia, suspect compensatory in nature #Acute on Chronic combined HFrEF & HFpEF PMHx: HTN, CAD -Echocardiogram in CareEverywhere from 2023 showed EF 15%, grade III dd.   -Continuous cardiac monitoring -Maintain MAP >65 -Cautious  IV fluids -Vasopressors as needed to maintain MAP goal -Stress dose steroids -Trend lactic acid until normalized -Trend HS Troponin until peaked -Check Coox -Repeat Echocardiogram  -Diuresis as BP and renal function permits ~ holding for now due to acute shock  #Meets SIRS Criteria (WBC 22.2, HR 140's, Temperature 101.8) #Severe Sepsis due to ... #Presumed Cholangitis given known Choledocholithiasis  -Monitor fever curve -Trend WBC's & Procalcitonin -Follow cultures as above -Continue empiric Zosyn , add Vancomycin  for now pending cultures & sensitivities given critical illness -Trend LFT's -GI consulted, appreciate input ~ plan for ERCP  #AKI on CKD Stage IV #Anion Gap Metabolic Acidosis #Lactic Acidosis #Mild Hypokalemia PMHx: Bladder cancer s/p ileostomy (not on treatment) -Monitor I&O's / urinary output -Follow BMP -Ensure adequate renal perfusion -Avoid nephrotoxic agents as able -Replace electrolytes as indicated ~ Pharmacy following for assistance with electrolyte replacement -Bicarb gtt -Discussed with pt's wife ~ pt would never be accepting of Hemodialysis   #Anemia #Thrombocytopenia -  Monitor for S/Sx of bleeding -Trend CBC -Heparin  for VTE Prophylaxis (holding home Eliquis  for now) -Transfuse for Hgb <7 -Transfuse Platelets for Platelets count <10K, <50K with active bleeding, or <100K with Neurosurgical procedures -Check DIC panel  #Acute Metabolic Encephalopathy -Treatment of metabolic derangements as outlined above -Provide supportive care -Promote normal sleep/wake cycle and family presence -Avoid sedating medications as able        Pt is critically ill with multiorgan failure. Prognosis is guarded, high risk for further decompensation, cardiac arrest, and death.  Given current critical illness superimposed on multiple chronic co-morbidities and advanced age, overall long term prognosis is poor.  Pt is  DNR/DNI status, would NOT be accepting of  dialysis.  Depending on clinical course, may need to consult Palliative Care to assist with GOC discussions.   Best Practice (right click and Reselect all SmartList Selections daily)   Diet/type: NPO DVT prophylaxis: prophylactic heparin   GI prophylaxis: N/A Lines: Central line, Arterial Line, and yes and it is still needed Foley:  Yes, and it is still needed Code Status:  DNR Last date of multidisciplinary goals of care discussion [N/A]  8/4: Pt's wife and son updated at bedside on plan of care.  Labs   CBC: Recent Labs  Lab 10/12/23 2005 10/13/23 0452  WBC 11.0* 7.7  HGB 10.0* 9.0*  HCT 32.6* 28.8*  MCV 96.4 94.1  PLT 214 146*    Basic Metabolic Panel: Recent Labs  Lab 10/12/23 2005 10/13/23 0452  NA 132* 134*  K 3.7 3.3*  CL 97* 102  CO2 23 21*  GLUCOSE 196* 82  BUN 60* 57*  CREATININE 4.98* 4.73*  CALCIUM  8.6* 7.8*   GFR: Estimated Creatinine Clearance: 12 mL/min (A) (by C-G formula based on SCr of 4.73 mg/dL (H)). Recent Labs  Lab 10/12/23 2005 10/13/23 0452  WBC 11.0* 7.7    Liver Function Tests: Recent Labs  Lab 10/12/23 2005 10/13/23 0452  AST 139* 194*  ALT 50* 61*  ALKPHOS 514* 429*  BILITOT 2.8* 3.6*  PROT 7.0 5.8*  ALBUMIN 3.1* 2.4*   Recent Labs  Lab 10/12/23 2005  LIPASE 38   No results for input(s): AMMONIA in the last 168 hours.  ABG No results found for: PHART, PCO2ART, PO2ART, HCO3, TCO2, ACIDBASEDEF, O2SAT   Coagulation Profile: No results for input(s): INR, PROTIME in the last 168 hours.  Cardiac Enzymes: No results for input(s): CKTOTAL, CKMB, CKMBINDEX, TROPONINI in the last 168 hours.  HbA1C: Hgb A1c MFr Bld  Date/Time Value Ref Range Status  10/10/2019 06:30 AM 5.7 (H) 4.8 - 5.6 % Final    Comment:    (NOTE) Pre diabetes:          5.7%-6.4%  Diabetes:              >6.4%  Glycemic control for   <7.0% adults with diabetes     CBG: Recent Labs  Lab 10/13/23 0050  10/13/23 0622 10/13/23 0921 10/13/23 0931 10/13/23 1005  GLUCAP 108* 73 53* 203* 125*    Review of Systems:   Unable to assess due to AMS and critical illness   Past Medical History:  He,  has a past medical history of Anginal pain (HCC), Basal cell carcinoma (06/20/2009), Bladder cancer (HCC), Cancer (HCC), Chicken pox, Chronic kidney disease, Coronary artery disease, Diabetes mellitus without complication (HCC), Headache, Hypertension, Myocardial infarction (HCC), Plantar fasciitis, and Prostate cancer (HCC).   Surgical History:   Past Surgical History:  Procedure Laterality Date  CARDIOVERSION N/A 04/23/2017   Procedure: CARDIOVERSION;  Surgeon: Hester Wolm PARAS, MD;  Location: [facility] ORS;  Service: Cardiovascular;  Laterality: N/A;   CARDIOVERSION N/A 03/27/2018   Procedure: CARDIOVERSION (CATH LAB);  Surgeon: Hester Wolm PARAS, MD;  Location: [facility] ORS;  Service: Cardiovascular;  Laterality: N/A;   COLONOSCOPY     COLONOSCOPY WITH PROPOFOL  N/A 10/27/2014   Procedure: COLONOSCOPY WITH PROPOFOL ;  Surgeon: Lamar ONEIDA Holmes, MD;  Location: [facility] ENDOSCOPY;  Service: Endoscopy;  Laterality: N/A;   CORONARY ANGIOPLASTY     CYSTECTOMY     HERNIA REPAIR     LITHOTRIPSY     MOHS SURGERY     PROSTATE SURGERY       Social History:   reports that he quit smoking about 22 years ago. He started smoking about 47 years ago. He has a 37.5 pack-year smoking history. He has never used smokeless tobacco. He reports current alcohol  use. He reports that he does not use drugs.   Family History:  His family history includes Multiple myeloma in his mother.   Allergies Allergies  Allergen Reactions   Compazine [Prochlorperazine] Other (See Comments)    Hallucinations per pt I get naked and want to pray to everyone     Home Medications  Prior to Admission medications   Medication Sig Start Date End Date Taking? Authorizing Provider  amiodarone (PACERONE) 200 MG tablet Take 1 tablet by mouth  daily. 12/19/22 12/19/23 Yes [provider]  atorvastatin  (LIPITOR) 10 MG tablet Take 10 mg by mouth daily.   Yes [provider]  cephALEXin  (KEFLEX ) 500 MG capsule Take 1 capsule (500 mg total) by mouth 4 (four) times daily. 10/11/23  Yes White, Adrienne R, NP  Cholecalciferol (VITAMIN D-1000 MAX ST) 25 MCG (1000 UT) tablet Take 1 tablet by mouth daily. 12/07/21  Yes [provider]  ELIQUIS  5 MG TABS tablet Take 5 mg by mouth 2 (two) times daily. 03/24/17  Yes [provider]  fentaNYL  (DURAGESIC ) 50 MCG/HR Place 1 patch onto the skin every 3 (three) days. 10/02/23 11/01/23 Yes [provider]  hydrALAZINE  (APRESOLINE ) 25 MG tablet Take 25 mg by mouth 3 (three) times daily.   Yes [provider]  isosorbide dinitrate (ISORDIL) 10 MG tablet Take 10 mg by mouth 3 (three) times daily.   Yes [provider]  lactulose, encephalopathy, (CHRONULAC) 10 GM/15ML SOLN Take 10 g by mouth 2 (two) times daily. 07/29/23 09/22/24 Yes [provider]  metoCLOPramide (REGLAN) 5 MG tablet Take 5 mg by mouth every 6 (six) hours as needed. 08/15/23 08/14/24 Yes [provider]  ondansetron  (ZOFRAN ) 4 MG tablet Take 4 mg by mouth every 6 (six) hours as needed for nausea or vomiting. 07/17/23  Yes [provider]  oxyCODONE  (ROXICODONE ) 15 MG immediate release tablet Take 15 mg by mouth. 09/23/23  Yes [provider]  polyethylene glycol (MIRALAX / GLYCOLAX) 17 g packet Take 17 g by mouth daily. 07/17/23  Yes [provider]  potassium chloride  (KLOR-CON ) 10 MEQ tablet Take 10 mEq by mouth daily. 07/29/23 10/27/23 Yes [provider]  senna (SENOKOT) 8.6 MG tablet Take 2 tablets by mouth 2 (two) times daily. 07/17/23  Yes [provider]  sodium bicarbonate  650 MG tablet Take by mouth 2 (two) times daily.   Yes [provider]  torsemide (DEMADEX) 20 MG tablet Take 40 mg by mouth 2 (two) times daily.  02/12/20  Yes [provider]  vitamin B-12 (CYANOCOBALAMIN)  1000 MCG tablet Take 1,000 mcg by mouth daily. 09/02/19  Yes [provider]  amLODipine (NORVASC) 10 MG tablet AMLODIPINE BESYLATE 10 MG TABS Patient not taking: Reported on 10/12/2023 03/08/13   [provider]  atorvastatin  (LIPITOR) 20 MG tablet Take 10 mg by mouth daily.  Patient not taking: Reported on 10/12/2023    [provider]  clobetasol  cream (TEMOVATE ) 0.05 % Apply twice daily to affected areas as needed for itching. Avoid applying to face, groin, and axilla Patient not taking: Reported on 10/12/2023 01/08/23   Hester Alm BROCKS, MD  diazepam (VALIUM) 5 MG tablet SMARTSIG:1-2 Tablet(s) By Mouth Patient not taking: Reported on 10/12/2023 05/14/23   [provider]  fentaNYL  (DURAGESIC ) 25 MCG/HR Place 1 patch onto the skin. Patient not taking: Reported on 10/12/2023    [provider]  glipiZIDE (GLUCOTROL XL) 5 MG 24 hr tablet Take 5 mg by mouth 2 (two) times daily.  Patient not taking: Reported on 10/12/2023    [provider]  metolazone (ZAROXOLYN) 2.5 MG tablet Take by mouth. Patient not taking: Reported on 10/12/2023    [provider]  mirtazapine (REMERON) 15 MG tablet Take 15 mg by mouth at bedtime. Patient not taking: Reported on 10/12/2023 08/18/23 08/17/24  [provider]  MOVANTIK 12.5 MG TABS tablet Take 12.5 mg by mouth daily. Patient not taking: Reported on 10/12/2023    [provider]  Oxycodone  HCl 10 MG TABS Take 10 mg by mouth. 07/17/23   [provider]  pantoprazole  (PROTONIX ) 20 MG tablet Take 20 mg by mouth daily. 10/27/20   [provider]  potassium chloride  20 MEQ/15ML (10%) SOLN Take 20 mEq by mouth daily. Patient not taking: Reported on 10/12/2023 09/21/20   [provider]  traZODone (DESYREL) 50 MG tablet Take by mouth. Patient not taking: Reported on 10/12/2023 08/27/22   [provider]      Critical care time: 60 minutes     Inge Lecher, AGACNP-BC [facility] Prefer epic messenger for cross cover needs If after hours, please call E-link

## 2023-10-13 NOTE — Anesthesia Preprocedure Evaluation (Signed)
 Anesthesia Evaluation  Patient identified by MRN, date of birth, ID band Patient awake and Patient confused    Reviewed: Allergy & Precautions, NPO status , Patient's Chart, lab work & pertinent test resultsPreop documentation limited or incomplete due to emergent nature of procedure.  Airway Mallampati: II  TM Distance: >3 FB Neck ROM: full    Dental  (+) Chipped   Pulmonary neg pulmonary ROS, COPD, former smoker   Pulmonary exam normal  + decreased breath sounds      Cardiovascular Exercise Tolerance: Poor hypertension, + angina with exertion + CAD, + Past MI and + DOE  negative cardio ROS Normal cardiovascular exam Rhythm:Regular     Neuro/Psych negative neurological ROS  negative psych ROS   GI/Hepatic negative GI ROS, Neg liver ROS,,,  Endo/Other  negative endocrine ROSdiabetes, Type 2    Renal/GU Renal disease     Musculoskeletal negative musculoskeletal ROS (+)    Abdominal   Peds negative pediatric ROS (+)  Hematology negative hematology ROS (+)   Anesthesia Other Findings Past Medical History: No date: Anginal pain (HCC) 06/20/2009: Basal cell carcinoma     Comment:  R nose supratip No date: Bladder cancer (HCC) No date: Cancer (HCC) No date: Chicken pox No date: Chronic kidney disease No date: Coronary artery disease No date: Diabetes mellitus without complication (HCC) No date: Headache No date: Hypertension No date: Myocardial infarction (HCC) No date: Plantar fasciitis No date: Prostate cancer [facility])  Past Surgical History: 04/23/2017: CARDIOVERSION; N/A     Comment:  Procedure: CARDIOVERSION;  Surgeon: Hester Wolm PARAS,               MD;  Location: [facility] ORS;  Service: Cardiovascular;                Laterality: N/A; 03/27/2018: CARDIOVERSION; N/A     Comment:  Procedure: CARDIOVERSION (CATH LAB);  Surgeon: Hester Wolm PARAS, MD;  Location: [facility] ORS;  Service:                Cardiovascular;  Laterality: N/A; No date: COLONOSCOPY 10/27/2014: COLONOSCOPY WITH PROPOFOL ; N/A     Comment:  Procedure: COLONOSCOPY WITH PROPOFOL ;  Surgeon: Lamar ONEIDA Holmes, MD;  Location: [facility] ENDOSCOPY;  Service:               Endoscopy;  Laterality: N/A; No date: CORONARY ANGIOPLASTY No date: CYSTECTOMY No date: HERNIA REPAIR No date: LITHOTRIPSY No date: MOHS SURGERY No date: PROSTATE SURGERY  BMI    Body Mass Index: 20.98 kg/m      Reproductive/Obstetrics negative OB ROS                              Anesthesia Physical Anesthesia Plan  ASA: 4  Anesthesia Plan: General   Post-op Pain Management:    Induction: Intravenous  PONV Risk Score and Plan: Ondansetron , Dexamethasone, Midazolam  and Treatment may vary due to age or medical condition  Airway Management Planned: Oral ETT  Additional Equipment:   Intra-op Plan:   Post-operative Plan: Possible Post-op intubation/ventilation  Informed Consent: I have reviewed the patients History and Physical, chart, labs and discussed the procedure including the risks, benefits and alternatives for the proposed anesthesia with the patient or authorized representative who has indicated his/her understanding and acceptance.  Dental Advisory Given  Plan Discussed with: CRNA and Surgeon  Anesthesia Plan Comments:         Anesthesia Quick Evaluation

## 2023-10-13 NOTE — Procedures (Signed)
 Arterial Catheter Insertion Procedure Note  Larry Mann  [id number]  04-Jan-1951  Date:10/13/23  Time:1:05 PM    Provider Performing: Belva Kevontay Burks    Procedure: Insertion of Arterial Line (63379) with US  guidance (23062)   Indication(s) Blood pressure monitoring and/or need for frequent ABGs  Consent Risks of the procedure as well as the alternatives and risks of each were explained to the patient and/or caregiver.  Consent for the procedure was obtained and is signed in the bedside chart  Anesthesia None   Time Out Verified patient identification, verified procedure, site/side was marked, verified correct patient position, special equipment/implants available, medications/allergies/relevant history reviewed, required imaging and test results available.   Sterile Technique Maximal sterile technique including full sterile barrier drape, hand hygiene, sterile gown, sterile gloves, mask, hair covering, sterile ultrasound probe cover (if used).   Procedure Description Area of catheter insertion was cleaned with chlorhexidine  and draped in sterile fashion. With real-time ultrasound guidance an arterial catheter was placed into the left radial artery.  Appropriate arterial tracings confirmed on monitor.     Complications/Tolerance None; patient tolerated the procedure well.   EBL Minimal   Specimen(s) None  Belva November, MD [facility] 10/13/2023 1:05 PM

## 2023-10-13 NOTE — Progress Notes (Signed)
 Upon transfer to ICU, pt noted to have increased rhonchi in lungs and notable for JVD. Patient's blood pressure remains hypotensive despite fluid boluses. Per Dr. Dorinda, okay to run IVF bolus at 100 mL/hr. MD also notified patient's rectal temp 102 F and HR maintaining in 140s. PRN rectal tylenol  administered. Dr. Dorinda to consult ICU team for administration of vasopressors.

## 2023-10-13 NOTE — Significant Event (Signed)
 Rapid Response Event Note   Reason for Call :  Hypotension, tachycardia  Initial Focused Assessment:  Rapid response RN arrived in patient's room with patient in bed in trendelenberg position surrounded by 1C staff and Dr. Dorinda. Patient responsive to pain but not alert or able to answer questions. BP 66/45 MAP 53, bolus of NS already infusing. HR 129 appears ST. RR 24, oxygen saturations 100% on room air. Temperature 98.8 oral on recent check per 1C staff. Urostomy with hematuria but baseline has some mild hematuria per family. Hgb 9.0 today, was 10.0 yesterday. Fentanyl  patch for chronic pain NOT removed due to pain control issues. Patient pupils equally round and reactive to light, 3mm. Patient moving all extremities when stimulated, no focal deficits noted.   Interventions:  12 EKG: ST. 09:38 repeat check of BP 77/49 MAP 59. MD updated family at bedside, plan to transfer to ICU 9 as SD patient. BP just before left was 75/46 MAP 57.  Plan of Care:  Patient transferred to ICU 9. Upon arrival to ICU when checking initial vital signs, temperature 98.4 F orally but patient felt very hot to touch so rectal temperature checked and was 102 F. PRN rectal tylenol  given.   Event Summary:   MD Notified: Dr. Dorinda Call Time: 09:33 Arrival Time: 09:35 End Time: 10:001  GEORGINA LAURAINE NORRIS, RN

## 2023-10-14 ENCOUNTER — Encounter: Payer: Self-pay | Admitting: Gastroenterology

## 2023-10-14 ENCOUNTER — Other Ambulatory Visit: Payer: PRIVATE HEALTH INSURANCE

## 2023-10-14 ENCOUNTER — Ambulatory Visit (HOSPITAL_COMMUNITY): Payer: Self-pay

## 2023-10-14 LAB — THYROID PANEL WITH TSH
Free Thyroxine Index: 10.3 — ABNORMAL HIGH (ref 1.2–4.9)
T3 Uptake Ratio: 55 % — ABNORMAL HIGH (ref 24–39)
T4, Total: 18.8 ug/dL — ABNORMAL HIGH (ref 4.5–12.0)
TSH: 0.686 u[IU]/mL (ref 0.450–4.500)

## 2023-10-14 LAB — CBC
HCT: 25.8 % — ABNORMAL LOW (ref 39.0–52.0)
Hemoglobin: 7.9 g/dL — ABNORMAL LOW (ref 13.0–17.0)
MCH: 30.4 pg (ref 26.0–34.0)
MCHC: 30.6 g/dL (ref 30.0–36.0)
MCV: 99.2 fL (ref 80.0–100.0)
Platelets: 129 K/uL — ABNORMAL LOW (ref 150–400)
RBC: 2.6 MIL/uL — ABNORMAL LOW (ref 4.22–5.81)
RDW: 17.2 % — ABNORMAL HIGH (ref 11.5–15.5)
WBC: 36.1 K/uL — ABNORMAL HIGH (ref 4.0–10.5)
nRBC: 0 % (ref 0.0–0.2)

## 2023-10-14 LAB — COMPREHENSIVE METABOLIC PANEL WITH GFR
ALT: 296 U/L — ABNORMAL HIGH (ref 0–44)
AST: 1239 U/L — ABNORMAL HIGH (ref 15–41)
Albumin: 1.8 g/dL — ABNORMAL LOW (ref 3.5–5.0)
Alkaline Phosphatase: 265 U/L — ABNORMAL HIGH (ref 38–126)
Anion gap: 25 — ABNORMAL HIGH (ref 5–15)
BUN: 60 mg/dL — ABNORMAL HIGH (ref 8–23)
CO2: 10 mmol/L — ABNORMAL LOW (ref 22–32)
Calcium: 6.8 mg/dL — ABNORMAL LOW (ref 8.9–10.3)
Chloride: 99 mmol/L (ref 98–111)
Creatinine, Ser: 5.11 mg/dL — ABNORMAL HIGH (ref 0.61–1.24)
GFR, Estimated: 11 mL/min — ABNORMAL LOW (ref >60)
Glucose, Bld: 118 mg/dL — ABNORMAL HIGH (ref 70–99)
Potassium: 4.7 mmol/L (ref 3.5–5.1)
Sodium: 134 mmol/L — ABNORMAL LOW (ref 135–145)
Total Bilirubin: 3.9 mg/dL — ABNORMAL HIGH (ref 0.0–1.2)
Total Protein: 5 g/dL — ABNORMAL LOW (ref 6.5–8.1)

## 2023-10-14 LAB — LACTIC ACID, PLASMA: Lactic Acid, Venous: >9 mmol/L (ref 0.5–1.9)

## 2023-10-14 LAB — BLOOD GAS, ARTERIAL
Acid-base deficit: 23.4 mmol/L — ABNORMAL HIGH (ref 0.0–2.0)
Bicarbonate: 6.7 mmol/L — ABNORMAL LOW (ref 20.0–28.0)
FIO2: 40 %
MECHVT: 500 mL
Mechanical Rate: 25
O2 Saturation: 98.7 %
PEEP: 5 cmH2O
Patient temperature: 37
pCO2 arterial: 27 mmHg — ABNORMAL LOW (ref 32–48)
pH, Arterial: 7 — CL (ref 7.35–7.45)
pO2, Arterial: 116 mmHg — ABNORMAL HIGH (ref 83–108)

## 2023-10-14 LAB — MAGNESIUM: Magnesium: 2.2 mg/dL (ref 1.7–2.4)

## 2023-10-14 LAB — GLUCOSE, CAPILLARY
Glucose-Capillary: 150 mg/dL — ABNORMAL HIGH (ref 70–99)
Glucose-Capillary: 64 mg/dL — ABNORMAL LOW (ref 70–99)

## 2023-10-14 MED ORDER — SODIUM CHLORIDE 0.9 % IV SOLN: INTRAVENOUS | Status: DC

## 2023-10-14 MED ORDER — MORPHINE BOLUS VIA INFUSION: 5.0000 mg | INTRAVENOUS | Status: DC | PRN

## 2023-10-14 MED ORDER — DEXTROSE 50 % IV SOLN
12.5000 g | INTRAVENOUS | Status: AC
  Administered 2023-10-14: 12.5 g via INTRAVENOUS

## 2023-10-14 MED ORDER — MORPHINE 100MG IN NS 100ML (1MG/ML) PREMIX INFUSION
0.0000 mg/h | INTRAVENOUS | Status: DC
  Administered 2023-10-14: 5 mg/h via INTRAVENOUS
  Filled 2023-10-14: qty 100

## 2023-10-14 MED ORDER — ALBUTEROL SULFATE (2.5 MG/3ML) 0.083% IN NEBU: 2.5000 mg | INHALATION_SOLUTION | RESPIRATORY_TRACT | Status: DC | PRN

## 2023-10-14 MED ORDER — GLYCOPYRROLATE 0.2 MG/ML IJ SOLN: 0.2000 mg | INTRAMUSCULAR | Status: DC | PRN

## 2023-10-14 MED ORDER — POLYVINYL ALCOHOL 1.4 % OP SOLN: 1.0000 [drp] | Freq: Four times a day (QID) | OPHTHALMIC | Status: DC | PRN

## 2023-10-14 MED ORDER — MIDAZOLAM HCL 2 MG/2ML IJ SOLN: 2.0000 mg | INTRAMUSCULAR | Status: DC | PRN

## 2023-10-14 MED ORDER — GLYCOPYRROLATE 1 MG PO TABS: 1.0000 mg | ORAL_TABLET | ORAL | Status: DC | PRN

## 2023-10-15 LAB — BLOOD CULTURE ID PANEL (REFLEXED) - BCID2
A.calcoaceticus-baumannii: NOT DETECTED
Bacteroides fragilis: DETECTED — AB
CTX-M ESBL: NOT DETECTED
Candida albicans: NOT DETECTED
Candida auris: NOT DETECTED
Candida glabrata: NOT DETECTED
Candida krusei: NOT DETECTED
Candida parapsilosis: NOT DETECTED
Candida tropicalis: NOT DETECTED
Carbapenem resist OXA 48 LIKE: NOT DETECTED
Carbapenem resistance IMP: NOT DETECTED
Carbapenem resistance KPC: NOT DETECTED
Carbapenem resistance NDM: NOT DETECTED
Carbapenem resistance VIM: NOT DETECTED
Cryptococcus neoformans/gattii: NOT DETECTED
Enterobacter cloacae complex: NOT DETECTED
Enterobacterales: DETECTED — AB
Enterococcus Faecium: NOT DETECTED
Enterococcus faecalis: NOT DETECTED
Escherichia coli: NOT DETECTED
Haemophilus influenzae: NOT DETECTED
Klebsiella aerogenes: NOT DETECTED
Klebsiella oxytoca: NOT DETECTED
Klebsiella pneumoniae: DETECTED — AB
Listeria monocytogenes: NOT DETECTED
Neisseria meningitidis: NOT DETECTED
Proteus species: NOT DETECTED
Pseudomonas aeruginosa: NOT DETECTED
Salmonella species: NOT DETECTED
Serratia marcescens: NOT DETECTED
Staphylococcus aureus (BCID): NOT DETECTED
Staphylococcus epidermidis: NOT DETECTED
Staphylococcus lugdunensis: NOT DETECTED
Staphylococcus species: NOT DETECTED
Stenotrophomonas maltophilia: NOT DETECTED
Streptococcus agalactiae: NOT DETECTED
Streptococcus pneumoniae: NOT DETECTED
Streptococcus pyogenes: NOT DETECTED
Streptococcus species: NOT DETECTED

## 2023-10-15 LAB — URINE CULTURE: Culture: >=100000 — AB

## 2023-10-17 LAB — CULTURE, BLOOD (ROUTINE X 2)
Culture  Setup Time: NO GROWTH
Special Requests: ADEQUATE

## 2023-10-22 LAB — CULTURE, BLOOD (ROUTINE X 2): Special Requests: ADEQUATE

## 2023-11-10 NOTE — IPAL (Addendum)
 Interdisciplinary Goals of Care Family Meeting      NAME: Larry Mann MRN: [id number] DOB : 03-Mar-1951 ATTENDING PHYSICIAN: Isadora Hose, MD   Date carried out: 20-Oct-2023  Location of the meeting: Bedside   Member's involved: NP and Family MemberS (Wife and son)   Durable Power of Pensions consultant or acting medical decision maker: Thom, Ollinger   Discussion:  Advance Care Planning/Goals of Care discussion was performed during the course of treatment to decide on type of care right for this patient following admission to the ICU.   I met with patient's wife and son to discuss goals of care in details following  change in patient's current status. Reviewed patient's worsening lab, ABGs, vital signs including unstable HR and blood pressure requiring multiple pressors and overall poor prognosis with the family at the bedside and answered all their question.   Discussed prognosis, expected outcome with or without ongoing aggressive treatments and the options for de-escalation of care.   Diagnosis(es): Acute hypoxic respiratory failure secondary severe sepsis, severe with shock due to Ascending cholangitis  Prognosis: Poor Code Status: DNR Disposition: ICU Next Steps:  Family understands the situation. They have consented and agreed to DNR/DNI and would not wish to pursue any aggressive treatment.  Patient's family would like to proceed with full comfort care including terminal extubation.    Family are satisfied with Plan of action and management. All questions answered     Total Time Spent Face to Face addressing advance care planning in the presence of the Patient: 35 minutes       Almarie Nose, DNP, FNP-C, AGACNP-BC Acute Care Nurse Practitioner [facility] Pager: [phone] [place] [facility]

## 2023-11-10 NOTE — Anesthesia Postprocedure Evaluation (Signed)
 Anesthesia Post Note  Patient: Larry Mann  Procedure(s) Performed: ERCP, WITH INTERVENTION IF INDICATED  Patient location during evaluation: ICU Anesthesia Type: General Post-procedure mental status: Pt health status declined over night, family decision to withdraw care (see notes), Pt deceased upon post op follow up by anesthesia this AM October 18, 2023. Anesthetic complications: no Comments: Pt deceased upon anesthesia post-op follow up this AM Oct 18, 2023, see ICU provider notes   No notable events documented.   Last Vitals:  Vitals:   2023/10/18 0500 October 18, 2023 0515  BP:    Pulse: (!) 54   Resp: (!) 25 (!) 7  Temp: (!) 36.3 C (!) 36.3 C  SpO2: 100%     Last Pain:  Vitals:   10-18-2023 0400  TempSrc: Rectal  PainSc:                  Larry Mann

## 2023-11-10 NOTE — Death Summary Note (Signed)
 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 AKI Anion Gap Metabolic Acidosis Lactic Acidosis Anemia Thrombocytopenia   Diagnoses  Preliminary cause of death: Septic Shock due to Ascending Cholangitis  Secondary Diagnoses (including complications and co-morbidities):  Principal Problem:   Choledocholithiasis Active Problems:   Septic shock (HCC)   AKI (acute kidney injury) (HCC)   Chronic congestive heart failure (HCC)   Cholangitis AKI Anion Gap Metabolic Acidosis Lactic Acidosis Anemia Thrombocytopenia  Acute Metabolic Encephalopathy   Brief Hospital Course (including significant findings, care, treatment, and services provided and events leading to death)  Larry Mann is a 73 y.o. male with medical history significant of bladder cancer s/p ileostomy not on treatment, followed by palliative care, chronic pain on opioid therapy, chronic HFrEF (echo on file from 11-11-2021 was EF 15% and grade III diastolic dysfunction), CKD stage 4/5, CAD, HTN, type 2 diabetes, follows at [facility] for his care who presented to the ED on 2023/11/05 for evaluation of worsening abdominal pain and nausea/vomiting.     Patient is currently altered and unable to contribute to history, therefore history is obtained from chart review and patient's wife at bedside.  Patient's wife reports taking him to urgent care yesterday suspecting pt may have a UTI.  Pt had been acting unusual, confused for 2 mornings in a row, and urine in his ileostomy had foul odor.  Urgent care did a UA, sent for culture and started  Keflex .  Today, patient developed worsening pain and persistent nausea/vomiting.  Wife reports pt has baseline lower abdominal pain from bladder mass and hernias, and he will intermittently have N/V, but nothing as severe and persistent as today.  Patient's usual pain regimen not controlling his abdominal pain.  Patient visibly shaking with chills, but no fevers noted at home.  Wife reports the shaking chills just started more recently.  Discussed code status with wife - she reports patient is DNR/DNI and follows with palliative care.  She is agreeable for procedures to address the current problem.       ED Course: Initial Vital Signs: HR 90, RR 18, BP 147/64, spO2 100% on room air, temp 97.6 F  Significant Labs: sodium 132, chloride 97, glucose 196, BUN 60, Cr 4.98, Ca 8.6, albumin, 3.1, alk phos 514, AST 139, ALT 50, T bili 2.8, WBC 11.0, Hbg 10.0  Imaging CT Abdomen/Pelvis>> 8 mm distal obstructing CBD stone with moderate common bile duct dilation, cholelithiasis with moderate gallbladder distention.  Other findings per report. Doppler U/S of LEFT lower extremity negative for DVT.  Medications Administered:  bolus IV fluids, Zosyn , IV dilaudid , IV Zofran , IV Protonix     [facility] asked to admit for further workup and treatment.   Please see Significant Hospital Events section below for full detailed hospital course.  Significant Hospital Events: Including procedures, antibiotic start and stop dates in addition to other pertinent events   8/3: Admitted by [facility] for Choledocholithiasis.  8/4: Rapid response called for hypotension, tachycardia, fever, AMS due to developing septic shock.  Transferred to ICU for vasopressors, PCCM consulted.  Central line and A-line placed.  GI performed ERCP,  returns to ICU and remains intubated, critically ill with multiorgan failure. 8/5: worsening lab, ABGs, vital signs including unstable HR and blood pressure requiring multiple pressors and overall poor prognosis  with the family at the bedside and answered all their question. Family elected to transition to COMFORT MEASURES, pt expired quickly after care withdrawn.     Pertinent Labs and Studies  Significant Diagnostic Studies DG Chest Port 1 View Result Date: 10/13/2023 EXAM: 1 VIEW XRAY OF THE CHEST 10/13/2023 05:08:00 PM COMPARISON: Earlier today. CLINICAL HISTORY: Impaired oxygenation. FINDINGS: LUNGS AND PLEURA: Worsening ill-defined retrocardiac opacity. Increasing atelectasis at the right lung base. Vascular congestion. HEART AND MEDIASTINUM: Cardiomegaly is grossly stable. BONES AND SOFT TISSUES: No acute osseous abnormality. LINES AND TUBES: Endotracheal tube tip is 4 cm from the carina at the level of the clavicular heads. Left-sided pacemaker remains in place. Left central venous catheter overlies the upper SVC. IMPRESSION: 1. Endotracheal Tube tip at the level of the clavicular heads. 2. Worsening ill-defined retrocardiac opacity and increasing atelectasis at the right lung base. 3. Vascular congestion. 4. Cardiomegaly, grossly stable. Electronically signed by: Andrea Gasman MD 10/13/2023 05:19 PM EDT RP Workstation: [id number]   DG C-Arm 1-60 Min-No Report Result Date: 10/13/2023 Fluoroscopy was utilized by the requesting physician.  No radiographic interpretation.   DG Chest Port 1 View Result Date: 10/13/2023 CLINICAL DATA:  Central line placement.  Sepsis. EXAM: PORTABLE CHEST 1 VIEW COMPARISON:  Chest radiograph dated 07/26/2023 FINDINGS: Lines/tubes: Left chest wall ICD lead projects over the right ventricle. Left internal jugular venous catheter tip projects over the SVC. Lungs: Well inflated lungs. Chronic-appearing bilateral pulmonary vasculature. No focal consolidations. Pleura: No pneumothorax or pleural effusion. Heart/mediastinum: Similar enlarged cardiomediastinal silhouette. Bones: No acute osseous abnormality. IMPRESSION: 1. Left internal jugular venous catheter tip projects over the  SVC. No pneumothorax. 2. Similar cardiomegaly. Electronically Signed   By: Limin  Xu M.D.   On: 10/13/2023 13:27   CT ABDOMEN PELVIS WO CONTRAST Result Date: 10/12/2023 CLINICAL DATA:  Acute abdominal pain. EXAM: CT ABDOMEN AND PELVIS WITHOUT CONTRAST TECHNIQUE: Multidetector CT imaging of the abdomen and pelvis was performed following the standard protocol without IV contrast. RADIATION DOSE REDUCTION: This exam was performed according to the departmental dose-optimization program which includes automated exposure control, adjustment of the mA and/or kV according to patient size and/or use of iterative reconstruction technique. COMPARISON:  CT scan from 2022 FINDINGS: Lower chest: The lung bases are clear of an acute process. No pulmonary lesions. The heart is normal in size for age. Pacer wires are in good position. Aortic and coronary artery calcifications noted. Hepatobiliary: No hepatic lesions are identified without contrast. No intrahepatic biliary dilatation. Gallbladder is mildly distended with a transverse diameter of 5 cm. There are small layering gallstones in the gallbladder. I do not see any gallbladder wall thickening or pericholecystic fluid to suggest acute cholecystitis. There is moderate common bile duct dilatation which measures up to 10 mm in the porta hepatis and there is a no mass, inflammation ductal dilatation. Moderate pancreatic atrophy. Distal obstructing common bile duct stone measuring 8 mm right at the ampulla. Pancreas: No mass, inflammation or ductal dilatation. Moderate pancreatic atrophy. Spleen: Borderline splenomegaly.  No splenic lesions. Adrenals/Urinary Tract: The adrenal glands are normal. Bilateral renal calculi noted with a staghorn type calculus in the upper pole region of the right kidney. Stable lateral interpolar right renal cyst. Significant and chronic appearing bilateral hydronephrosis with renal cortical thinning and scarring. There is also bilateral  hydroureter  down to a retroperitoneal mass measuring approximately 5.6 x 3.1 cm. The patient has had a cystectomy suggesting bladder cancer with urinary diversion and right lower quadrant ileostomy. Moderate-sized peristomal hernia noted. There is also a periumbilical abdominal wall hernia containing fat and bowel but no evidence of obstruction or incarceration. Stomach/Bowel: The stomach, duodenum, small bowel and colon are grossly normal without contrast. No evidence for bowel obstruction. Severe descending and sigmoid colon diverticulosis noted. Vascular/Lymphatic: Advanced atherosclerotic calcification involving the aorta and branch vessels. No aneurysm. Borderline enlarged retroperitoneal lymph nodes measuring a maximum of 10 mm. No pelvic adenopathy or inguinal adenopathy. Surgical changes from right inguinal hernia repair with mesh. Reproductive: The prostate gland is small. Other: No free abdominal/free pelvic fluid collections or free air. Musculoskeletal: No significant bony findings. IMPRESSION: 1. 8 mm distal obstructing common bile duct stone with moderate common bile duct dilatation. 2. Cholelithiasis and moderate distention of the gallbladder. 3. Significant and chronic appearing bilateral hydronephrosis with renal cortical thinning and scarring. There is also bilateral hydroureter down to a retroperitoneal mass measuring approximately 5.6 x 3.1 cm. This could be recurrent bladder cancer adenopathy. 4. Status post cystectomy with urinary diversion and right lower quadrant ileostomy. 5. Moderate-sized peristomal hernia. 6. Periumbilical abdominal wall hernia containing fat and bowel but no evidence of obstruction or incarceration. 7. Severe descending and sigmoid colon diverticulosis without findings for acute diverticulitis. 8. Advanced atherosclerotic calcification involving the aorta and branch vessels. 9. Aortic atherosclerosis. Aortic Atherosclerosis (ICD10-I70.0). Electronically Signed   By: MYRTIS Stammer  M.D.   On: 10/12/2023 21:56   US  Venous Img Lower Unilateral Left Result Date: 10/12/2023 EXAM: ULTRASOUND DUPLEX OF THE LEFT LOWER EXTREMITY VEINS TECHNIQUE: Duplex ultrasound using B-mode/gray scaled imaging and Doppler spectral analysis and color flow was obtained of the deep venous structures of the left lower extremity. COMPARISON: None. CLINICAL HISTORY: LLE swelling, pain. FINDINGS: The visualized veins of the lower extremity are patent and free of echogenic thrombus. The veins demonstrate good compressibility with normal color flow study and spectral analysis. IMPRESSION: 1. No evidence of DVT. Electronically signed by: Norman Gatlin MD 10/12/2023 09:17 PM EDT RP Workstation: [id number]    Microbiology Recent Results (from the past 240 hours)  Urine Culture     Status: Abnormal (Preliminary result)   Collection Time: 10/11/23  8:31 AM   Specimen: Urine, Clean Catch  Result Value Ref Range Status   Specimen Description URINE, CLEAN CATCH  Final   Special Requests NONE  Final   Culture (A)  Final    >=100,000 COLONIES/mL GRAM NEGATIVE RODS CULTURE REINCUBATED FOR BETTER GROWTH IDENTIFICATION AND SUSCEPTIBILITIES TO FOLLOW Performed at [facility], [address] [place], [place] [address]    Report Status PENDING  Incomplete  MRSA Next Gen by PCR, Nasal     Status: None   Collection Time: 10/13/23 10:30 AM   Specimen: Nasal Mucosa; Nasal Swab  Result Value Ref Range Status   MRSA by PCR Next Gen NOT DETECTED NOT DETECTED Final    Comment: (NOTE) The GeneXpert MRSA Assay (FDA approved for NASAL specimens only), is one component of a comprehensive MRSA colonization surveillance program. It is not intended to diagnose MRSA infection nor to guide or monitor treatment for MRSA infections. Test performance is not FDA approved in patients less than 61 years old. Performed at [facility], [address] [place], [place] [address]     Lab Basic Metabolic  Panel: Recent Labs  Lab  10/12/23 2005 10/13/23 0452 10/13/23 1315 10/13/23 1834 10/16/2023 0115  NA 132* 134* 135 134* 134*  K 3.7 3.3* 3.4* 3.9 4.7  CL 97* 102 104 104 99  CO2 23 21* 15* 13* 10*  GLUCOSE 196* 82 75 68* 118*  BUN 60* 57* 59* 62* 60*  CREATININE 4.98* 4.73* 5.21* 5.24* 5.11*  CALCIUM  8.6* 7.8* 7.3* 6.8* 6.8*  MG  --   --   --   --  2.2   Liver Function Tests: Recent Labs  Lab 10/12/23 2005 10/13/23 0452 10/13/23 1315 10/13/23 1834 Oct 16, 2023 0115  AST 139* 194* 450* 777* 1,239*  ALT 50* 61* 117* 181* 296*  ALKPHOS 514* 429* 348* 304* 265*  BILITOT 2.8* 3.6* 4.3* 3.8* 3.9*  PROT 7.0 5.8* 4.9* 4.9* 5.0*  ALBUMIN 3.1* 2.4* 1.9* 1.8* 1.8*   Recent Labs  Lab 10/12/23 2005  LIPASE 38   No results for input(s): AMMONIA in the last 168 hours. CBC: Recent Labs  Lab 10/12/23 2005 10/13/23 0452 10/13/23 1317 10/16/2023 0115  WBC 11.0* 7.7 22.2* 36.1*  NEUTROABS  --   --  20.9*  --   HGB 10.0* 9.0* 8.3* 7.9*  HCT 32.6* 28.8* 25.5* 25.8*  MCV 96.4 94.1 93.8 99.2  PLT 214 146* 136* 129*   Cardiac Enzymes: No results for input(s): CKTOTAL, CKMB, CKMBINDEX, TROPONINI in the last 168 hours. Sepsis Labs: Recent Labs  Lab 10/12/23 2005 10/13/23 0452 10/13/23 1315 10/13/23 1317 10/13/23 1457 10/13/23 1834 2023-10-16 0115  WBC 11.0* 7.7  --  22.2*  --   --  36.1*  LATICACIDVEN  --   --  4.9*  --  5.6* 7.5* >9.0*    Procedures/Operations  8/4: Central line placement 8/4: Arterial line placement 8/4: Endotracheal intubation (for ERCP) 8/4: ERCP       Inge Lecher, AGACNP-BC [facility] Prefer epic messenger for cross cover needs If after hours, please call E-link  Inge JONETTA Lecher 2023-10-16, 7:19 AM

## 2023-11-10 NOTE — Progress Notes (Signed)
 Pt extubated to room air per NP order, RN at bedside.

## 2023-11-10 NOTE — Progress Notes (Signed)
 Started morphine  for comfort per orders, RT at bedside. ETT removed at 0511. Pressors then turned off. Family brought back to bedside.

## 2023-11-10 DEATH — deceased

## 2023-11-27 ENCOUNTER — Encounter: Payer: Self-pay | Admitting: Gastroenterology
